# Patient Record
Sex: Female | Born: 1995 | Race: Black or African American | Hispanic: No | Marital: Single | Smoking: Never smoker
Health system: Southern US, Community
[De-identification: ages and names within clinical notes are randomized; demographics above are authoritative.]

## PROBLEM LIST (undated history)

## (undated) ENCOUNTER — Inpatient Hospital Stay (HOSPITAL_COMMUNITY): Payer: Self-pay

## (undated) DIAGNOSIS — B3731 Acute candidiasis of vulva and vagina: Secondary | ICD-10-CM

## (undated) DIAGNOSIS — R87629 Unspecified abnormal cytological findings in specimens from vagina: Secondary | ICD-10-CM

## (undated) DIAGNOSIS — F419 Anxiety disorder, unspecified: Secondary | ICD-10-CM

## (undated) DIAGNOSIS — Z8751 Personal history of pre-term labor: Secondary | ICD-10-CM

## (undated) DIAGNOSIS — O99345 Other mental disorders complicating the puerperium: Secondary | ICD-10-CM

## (undated) DIAGNOSIS — B373 Candidiasis of vulva and vagina: Secondary | ICD-10-CM

## (undated) DIAGNOSIS — E0590 Thyrotoxicosis, unspecified without thyrotoxic crisis or storm: Secondary | ICD-10-CM

## (undated) DIAGNOSIS — F53 Postpartum depression: Secondary | ICD-10-CM

## (undated) HISTORY — DX: Anxiety disorder, unspecified: F41.9

## (undated) HISTORY — DX: Unspecified abnormal cytological findings in specimens from vagina: R87.629

## (undated) HISTORY — DX: Thyrotoxicosis, unspecified without thyrotoxic crisis or storm: E05.90

## (undated) HISTORY — DX: Personal history of pre-term labor: Z87.51

## (undated) HISTORY — DX: Postpartum depression: F53.0

## (undated) HISTORY — PX: NO PAST SURGERIES: SHX2092

## (undated) HISTORY — DX: Other mental disorders complicating the puerperium: O99.345

---
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
## 2015-02-13 ENCOUNTER — Telehealth: Payer: Self-pay | Admitting: *Deleted

## 2015-02-13 NOTE — Telephone Encounter (Signed)
Patient contacted the office requesting an appointment for a Nexplanon removal.  Attempted to contact patient left message with patient's mother to contact the patient and asked her have patient to return the call.

## 2015-02-16 ENCOUNTER — Encounter (HOSPITAL_COMMUNITY): Payer: Self-pay | Admitting: Emergency Medicine

## 2015-02-16 ENCOUNTER — Other Ambulatory Visit (HOSPITAL_COMMUNITY)
Admission: RE | Admit: 2015-02-16 | Discharge: 2015-02-16 | Disposition: A | Discharge status: Home / Self Care | Payer: Medicaid Other | Source: Ambulatory Visit | Attending: Family Medicine | Admitting: Family Medicine

## 2015-02-16 ENCOUNTER — Emergency Department (INDEPENDENT_AMBULATORY_CARE_PROVIDER_SITE_OTHER)
Admission: EM | Admit: 2015-02-16 | Discharge: 2015-02-16 | Disposition: A | Discharge status: Home / Self Care | Payer: Self-pay | Source: Home / Self Care

## 2015-02-16 DIAGNOSIS — Z113 Encounter for screening for infections with a predominantly sexual mode of transmission: Secondary | ICD-10-CM | POA: Insufficient documentation

## 2015-02-16 DIAGNOSIS — N760 Acute vaginitis: Secondary | ICD-10-CM | POA: Insufficient documentation

## 2015-02-16 DIAGNOSIS — R112 Nausea with vomiting, unspecified: Secondary | ICD-10-CM

## 2015-02-16 DIAGNOSIS — N898 Other specified noninflammatory disorders of vagina: Secondary | ICD-10-CM

## 2015-02-16 LAB — POCT URINALYSIS DIP (DEVICE)
Bilirubin Urine: NEGATIVE
GLUCOSE, UA: NEGATIVE mg/dL
Hgb urine dipstick: NEGATIVE
Ketones, ur: NEGATIVE mg/dL
Leukocytes, UA: NEGATIVE
Nitrite: NEGATIVE
PROTEIN: 30 mg/dL — AB
Specific Gravity, Urine: 1.02 (ref 1.005–1.030)
Urobilinogen, UA: 1 mg/dL (ref 0.0–1.0)
pH: 7 (ref 5.0–8.0)

## 2015-02-16 LAB — POCT PREGNANCY, URINE: PREG TEST UR: NEGATIVE

## 2015-02-16 MED ORDER — METOCLOPRAMIDE HCL 5 MG PO TABS: 5.0000 mg | ORAL_TABLET | Freq: Three times a day (TID) | ORAL | Status: DC | PRN

## 2015-02-16 NOTE — ED Notes (Signed)
Call back number verified.  

## 2015-02-16 NOTE — ED Notes (Signed)
C/o nauseas and vomiting x6 since 1100 today Denies fevers, chills Also reports vag d/c Alert... No acute distress.

## 2015-02-16 NOTE — ED Provider Notes (Signed)
CSN: [id number]     Arrival date & time 02/16/15  4098 History   None    Chief Complaint  Patient presents with  . Nausea  . Vaginal Discharge   (Consider location/radiation/quality/duration/timing/severity/associated sxs/prior Treatment) Patient is a 19 y.o. female presenting with vomiting and vaginal discharge. The history is provided by the patient. No language interpreter was used.  Emesis Severity:  Moderate Duration:  10 hours Timing:  Intermittent Quality:  Stomach contents Able to tolerate:  Liquids and solids Progression:  Unchanged Recent urination:  Normal Relieved by:  Antiemetics (OTC medication for N/V) Exacerbated by: Smell at work. Associated symptoms: no abdominal pain, no cough, no diarrhea, no fever, no headaches and no URI   Risk factors: sick contacts   Risk factors: not pregnant now and no suspect food intake   Risk factors comment:  Mom was recently sick with nausea and diarrhea Vaginal Discharge Quality:  Watery and clear Onset quality:  Gradual Timing:  Intermittent Context: spontaneously   Relieved by:  Nothing Worsened by:  Nothing tried Associated symptoms: vomiting   Associated symptoms: no abdominal pain, no dysuria, no fever and no vaginal itching   Associated symptoms comment:  Sexually active She is worried about STD and will like to get screened  Past Medical History  Diagnosis Date  . Asthma   . Yeast infection of the vagina   . Post partum depression    Past Surgical History  Procedure Laterality Date  . No past surgeries     Family History  Problem Relation Age of Onset  . Hypertension Mother   . Diabetes Mother   . Asthma Father    Social History  Substance Use Topics  . Smoking status: Never Smoker   . Smokeless tobacco: Never Used  . Alcohol Use: No   OB History    Gravida Para Term Preterm AB TAB SAB Ectopic Multiple Living   0 0 0 0 0 2     Review of Systems  Constitutional: Negative for fever.    Respiratory: Negative.   Cardiovascular: Negative.   Gastrointestinal: Positive for vomiting. Negative for abdominal pain and diarrhea.  Genitourinary: Positive for vaginal discharge. Negative for dysuria.  Neurological: Negative for headaches.  All other systems reviewed and are negative.   Allergies  Review of patient's allergies indicates no known allergies.  Home Medications   Prior to Admission medications   Medication Sig Start Date End Date Taking? Authorizing Provider  ibuprofen (ADVIL,MOTRIN) 800 MG tablet Take 1 tablet (800 mg total) by mouth every 8 (eight) hours as needed. 07/18/14   Brock Bad, MD  norethindrone-ethinyl estradiol 1/35 (ORTHO-NOVUM 1/35, 28,) tablet Take 1 tablet by mouth daily. 07/18/14   Brock Bad, MD  Prenat-FeCbn-FeAspGl-FA-Omega (OB COMPLETE PETITE) 35-5-1-200 MG CAPS Take 1 capsule by mouth daily before breakfast. 07/21/14   Brock Bad, MD  PROAIR HFA 108 [phone] BASE) MCG/ACT inhaler INHALE ONE TO TWO PUFFS INTO LUNGS 4 TIMES DAILY AS NEEDED 11/07/12   Antionette Char, MD  sertraline (ZOLOFT) 50 MG tablet Take 1 tablet (50 mg total) by mouth daily. 07/21/14   Brock Bad, MD  SPRINTEC 28 0.25-35 MG-MCG tablet TAKE ONE TABLET BY MOUTH ONCE DAILY Patient not taking: Reported on 07/18/2014 06/22/14   Brock Bad, MD   Meds Ordered and Administered this Visit  Medications - No data to display  BP 111/66 mmHg  Pulse 82  Temp(Src) 98.4 F (36.9 C) (Oral)  Resp 18  SpO2 100% No data found.   Physical Exam  Constitutional: She appears well-developed and well-nourished. No distress.  Cardiovascular: Normal rate, regular rhythm and normal heart sounds.   No murmur heard. Pulmonary/Chest: Effort normal and breath sounds normal. No respiratory distress. She has no wheezes.  Abdominal: Soft. Bowel sounds are normal. She exhibits no distension and no mass. There is no tenderness.  Genitourinary: Vagina normal and uterus normal.  Cervix exhibits no motion tenderness and no friability. No vaginal discharge found.    Nursing note and vitals reviewed.   ED Course  Procedures (including critical care time)  Labs Review Labs Reviewed  POCT URINALYSIS DIP (DEVICE) - Abnormal; Notable for the following:    Protein, ur 30 (*)    All other components within normal limits  POCT PREGNANCY, URINE    Imaging Review No results found.   Visual Acuity Review  Right Eye Distance:   Left Eye Distance:   Bilateral Distance:    Right Eye Near:   Left Eye Near:    Bilateral Near:         MDM  No diagnosis found. Non-intractable vomiting with nausea, vomiting of unspecified type  Vaginal discharge  1. Patient had not vomited in the last 4 hrs. She is well hydrated with hemodynamic stability. Reglan given prn N/V. Keep self hydrate. Return precaution discussed. 2. Wet prep done, I will call her with result. GC/Chlamydia also checked with HIV test. I will call patient with results. No treatment indicated at this time.    Doreene Eland, MD 02/16/15 2007

## 2015-02-16 NOTE — Discharge Instructions (Signed)

## 2015-02-17 LAB — HIV ANTIBODY (ROUTINE TESTING W REFLEX): HIV Screen 4th Generation wRfx: NONREACTIVE

## 2015-02-18 LAB — CERVICOVAGINAL ANCILLARY ONLY
Chlamydia: NEGATIVE
NEISSERIA GONORRHEA: NEGATIVE

## 2015-02-18 NOTE — ED Notes (Signed)
Gc, chlamydia reports negative. Still waiting on wet prep and HIV reports

## 2015-02-19 ENCOUNTER — Telehealth: Payer: Self-pay | Admitting: Family Medicine

## 2015-02-19 ENCOUNTER — Inpatient Hospital Stay (HOSPITAL_COMMUNITY)
Admission: AD | Admit: 2015-02-19 | Discharge: 2015-02-19 | Disposition: A | Discharge status: Home / Self Care | Payer: Medicaid Other | Source: Ambulatory Visit | Attending: Obstetrics | Admitting: Obstetrics

## 2015-02-19 DIAGNOSIS — Z3202 Encounter for pregnancy test, result negative: Secondary | ICD-10-CM | POA: Diagnosis not present

## 2015-02-19 DIAGNOSIS — R112 Nausea with vomiting, unspecified: Secondary | ICD-10-CM | POA: Diagnosis not present

## 2015-02-19 LAB — URINALYSIS, ROUTINE W REFLEX MICROSCOPIC
Bilirubin Urine: NEGATIVE
Glucose, UA: NEGATIVE mg/dL
Hgb urine dipstick: NEGATIVE
Ketones, ur: 15 mg/dL — AB
Leukocytes, UA: NEGATIVE
Nitrite: NEGATIVE
PH: 6 (ref 5.0–8.0)
Protein, ur: NEGATIVE mg/dL
SPECIFIC GRAVITY, URINE: 1.02 (ref 1.005–1.030)
UROBILINOGEN UA: 0.2 mg/dL (ref 0.0–1.0)

## 2015-02-19 LAB — CERVICOVAGINAL ANCILLARY ONLY: Wet Prep (BD Affirm): POSITIVE — AB

## 2015-02-19 LAB — POCT PREGNANCY, URINE: PREG TEST UR: NEGATIVE

## 2015-02-19 MED ORDER — ONDANSETRON HCL 4 MG PO TABS: 4.0000 mg | ORAL_TABLET | Freq: Four times a day (QID) | ORAL | Status: DC

## 2015-02-19 MED ORDER — METRONIDAZOLE 500 MG PO TABS: 500.0000 mg | ORAL_TABLET | Freq: Two times a day (BID) | ORAL | Status: DC

## 2015-02-19 NOTE — Discharge Instructions (Signed)
Nausea and Vomiting Nausea means you feel sick to your stomach. Throwing up (vomiting) is a reflex where stomach contents come out of your mouth. HOME CARE   Take medicine as told by your doctor.  Do not force yourself to eat. However, you do need to drink fluids.  If you feel like eating, eat a normal diet as told by your doctor.  Eat rice, wheat, potatoes, bread, lean meats, yogurt, fruits, and vegetables.  Avoid high-fat foods.  Drink enough fluids to keep your pee (urine) clear or pale yellow.  Ask your doctor how to replace body fluid losses (rehydrate). Signs of body fluid loss (dehydration) include:  Feeling very thirsty.  Dry lips and mouth.  Feeling dizzy.  Dark pee.  Peeing less than normal.  Feeling confused.  Fast breathing or heart rate. GET HELP RIGHT AWAY IF:   You have blood in your throw up.  You have black or bloody poop (stool).  You have a bad headache or stiff neck.  You feel confused.  You have bad belly (abdominal) pain.  You have chest pain or trouble breathing.  You do not pee at least once every 8 hours.  You have cold, clammy skin.  You keep throwing up after 24 to 48 hours.  You have a fever. MAKE SURE YOU:   Understand these instructions.  Will watch your condition.  Will get help right away if you are not doing well or get worse. Document Released: 11/25/2007 Document Revised: 08/31/2011 Document Reviewed: 11/07/2010 [facility] Patient Information 2015 [place], [place]. This information is not intended to replace advice given to you by your health care provider. Make sure you discuss any questions you have with your health care provider. Bacterial Vaginosis Bacterial vaginosis is an infection of the vagina. It happens when too many of certain germs (bacteria) grow in the vagina. HOME CARE  Take your medicine as told by your doctor.  Finish your medicine even if you start to feel better.  Do not have sex until you finish  your medicine and are better.  Tell your sex partner that you have an infection. They should see their doctor for treatment.  Practice safe sex. Use condoms. Have only one sex partner. GET HELP IF:  You are not getting better after 3 days of treatment.  You have more grey fluid (discharge) coming from your vagina than before.  You have more pain than before.  You have a fever. MAKE SURE YOU:   Understand these instructions.  Will watch your condition.  Will get help right away if you are not doing well or get worse. Document Released: 03/17/2008 Document Revised: 03/29/2013 Document Reviewed: 01/18/2013 [facility] Patient Information 2015 [place], [place]. This information is not intended to replace advice given to you by your health care provider. Make sure you discuss any questions you have with your health care provider.

## 2015-02-19 NOTE — MAU Note (Signed)
Pt states she was seen at urgent care 2 days ago due to vomiting and states everything was negative. States she started having vomiting again today, last vomited 2 hours ago but is still nauseated.

## 2015-02-19 NOTE — MAU Provider Note (Signed)
History     CSN: [id number]  Arrival date and time: 02/19/15 2225   First Provider Initiated Contact with Patient 02/19/15 2321      No chief complaint on file.  HPI  [name] is a 19 y.o. [id number] who presents to MAU today with complaint of N/V. She states that she was seen at Urgent Care 2 days ago with the same symptoms and diagnosed with BV. She was given Rx for Flagyl which she started last night. She states that N/V started again earlier today, but she has not had vomiting in > 2 hours. She denies vaginal bleeding or discharge. She has not taken anything for nausea.   OB History    Gravida Para Term Preterm AB TAB SAB Ectopic Multiple Living   0 0 0 0 0 2      Past Medical History  Diagnosis Date  . Asthma   . Yeast infection of the vagina   . Post partum depression     Past Surgical History  Procedure Laterality Date  . No past surgeries      Family History  Problem Relation Age of Onset  . Hypertension Mother   . Diabetes Mother   . Asthma Father     Social History  Substance Use Topics  . Smoking status: Never Smoker   . Smokeless tobacco: Never Used  . Alcohol Use: No    Allergies: No Known Allergies  No prescriptions prior to admission    Review of Systems  Constitutional: Negative for fever and malaise/fatigue.  Gastrointestinal: Positive for nausea and vomiting. Negative for abdominal pain, diarrhea and constipation.  Genitourinary: Negative for dysuria, urgency and frequency.       Neg - vaginal bleeding, discharge   Physical Exam   Blood pressure 112/63, pulse 70, temperature 98.7 F (37.1 C), temperature source Oral, resp. rate 17, height 5' 2.5" (1.588 m), weight 110 lb (49.896 kg), last menstrual period 02/01/2015, SpO2 100 %.  Physical Exam  Nursing note and vitals reviewed. Constitutional: She is oriented to person, place, and time. She appears well-developed and well-nourished. No distress.  HENT:  Head:  Normocephalic and atraumatic.  Cardiovascular: Normal rate.   Respiratory: Effort normal.  GI: Soft. She exhibits no distension and no mass. There is no tenderness. There is no rebound and no guarding.  Neurological: She is alert and oriented to person, place, and time.  Skin: Skin is warm and dry. No erythema.  Psychiatric: She has a normal mood and affect.     Results for orders placed or performed during the hospital encounter of 02/19/15 (from the past 24 hour(s))  Urinalysis, Routine w reflex microscopic (not at [facility])     Status: Abnormal   Collection Time: 02/19/15 10:45 PM  Result Value Ref Range   Color, Urine YELLOW YELLOW   APPearance CLEAR CLEAR   Specific Gravity, Urine 1.020 1.005 - 1.030   pH 6.0 5.0 - 8.0   Glucose, UA NEGATIVE NEGATIVE mg/dL   Hgb urine dipstick NEGATIVE NEGATIVE   Bilirubin Urine NEGATIVE NEGATIVE   Ketones, ur 15 (A) NEGATIVE mg/dL   Protein, ur NEGATIVE NEGATIVE mg/dL   Urobilinogen, UA 0.2 0.0 - 1.0 mg/dL   Nitrite NEGATIVE NEGATIVE   Leukocytes, UA NEGATIVE NEGATIVE  Pregnancy, urine POC     Status: None   Collection Time: 02/19/15 10:59 PM  Result Value Ref Range   Preg Test, Ur NEGATIVE NEGATIVE    MAU Course  Procedures None  MDM UPT - negative UA today does not show significant dehydration. Patient is tolerating PO while in MAU.   Assessment and Plan  A: Nausea and vomiting with Flagyl  P: Discharge home Rx for Zofran given to patient Patient advised to complete course of Flagyl Warning signs for worsening condition discussed Patient advised to follow-up with Dr. Clearance Coots as needed for routine care Patient may return to MAU as needed or if her condition were to change or worsen   Marny Lowenstein, PA-C  02/19/2015, 11:50 PM

## 2015-02-19 NOTE — ED Notes (Signed)
Lab reports discussed w patient by provider

## 2015-02-19 NOTE — Telephone Encounter (Signed)
I discussed + BV with patient. She is advised to pick up A/B prescription from her pharmacy.

## 2015-03-29 ENCOUNTER — Ambulatory Visit: Payer: Medicaid Other | Admitting: Certified Nurse Midwife

## 2015-05-12 ENCOUNTER — Encounter (HOSPITAL_COMMUNITY): Payer: Self-pay | Admitting: *Deleted

## 2015-05-12 ENCOUNTER — Inpatient Hospital Stay (HOSPITAL_COMMUNITY)
Admission: AD | Admit: 2015-05-12 | Discharge: 2015-05-12 | Disposition: A | Discharge status: Home / Self Care | Payer: Self-pay | Source: Ambulatory Visit | Attending: Obstetrics | Admitting: Obstetrics

## 2015-05-12 DIAGNOSIS — N926 Irregular menstruation, unspecified: Secondary | ICD-10-CM | POA: Insufficient documentation

## 2015-05-12 DIAGNOSIS — N939 Abnormal uterine and vaginal bleeding, unspecified: Secondary | ICD-10-CM | POA: Insufficient documentation

## 2015-05-12 LAB — URINALYSIS, ROUTINE W REFLEX MICROSCOPIC
Bilirubin Urine: NEGATIVE
Glucose, UA: NEGATIVE mg/dL
KETONES UR: NEGATIVE mg/dL
Leukocytes, UA: NEGATIVE
NITRITE: NEGATIVE
PROTEIN: NEGATIVE mg/dL
Specific Gravity, Urine: 1.02 (ref 1.005–1.030)
pH: 7.5 (ref 5.0–8.0)

## 2015-05-12 LAB — CBC
HEMATOCRIT: 35 % — AB (ref 36.0–46.0)
HEMOGLOBIN: 12 g/dL (ref 12.0–15.0)
MCH: 27.1 pg (ref 26.0–34.0)
MCHC: 34.3 g/dL (ref 30.0–36.0)
MCV: 79.2 fL (ref 78.0–100.0)
Platelets: 248 10*3/uL (ref 150–400)
RBC: 4.42 MIL/uL (ref 3.87–5.11)
RDW: 12.8 % (ref 11.5–15.5)
WBC: 6.5 10*3/uL (ref 4.0–10.5)

## 2015-05-12 LAB — WET PREP, GENITAL
CLUE CELLS WET PREP: NONE SEEN
SPERM: NONE SEEN
Trich, Wet Prep: NONE SEEN
Yeast Wet Prep HPF POC: NONE SEEN

## 2015-05-12 LAB — URINE MICROSCOPIC-ADD ON

## 2015-05-12 LAB — HCG, QUANTITATIVE, PREGNANCY

## 2015-05-12 LAB — POCT PREGNANCY, URINE: PREG TEST UR: NEGATIVE

## 2015-05-12 NOTE — MAU Note (Signed)
Pt reports positive home preg test, some vaginal bleeding off/on for the last 3 days, lower abd pain since yesterday.

## 2015-05-12 NOTE — Discharge Instructions (Signed)
Levonorgestrel intrauterine device (IUD) What is this medicine? LEVONORGESTREL IUD (LEE voe nor jes trel) is a contraceptive (birth control) device. The device is placed inside the uterus by a healthcare professional. It is used to prevent pregnancy and can also be used to treat heavy bleeding that occurs during your period. Depending on the device, it can be used for 3 to 5 years. This medicine may be used for other purposes; ask your health care provider or pharmacist if you have questions. What should I tell my health care provider before I take this medicine? They need to know if you have any of these conditions: -abnormal Pap smear -cancer of the breast, uterus, or cervix -diabetes -endometritis -genital or pelvic infection now or in the past -have more than one sexual partner or your partner has more than one partner -heart disease -history of an ectopic or tubal pregnancy -immune system problems -IUD in place -liver disease or tumor -problems with blood clots or take blood-thinners -use intravenous drugs -uterus of unusual shape -vaginal bleeding that has not been explained -an unusual or allergic reaction to levonorgestrel, other hormones, silicone, or polyethylene, medicines, foods, dyes, or preservatives -pregnant or trying to get pregnant -breast-feeding How should I use this medicine? This device is placed inside the uterus by a health care professional. Talk to your pediatrician regarding the use of this medicine in children. Special care may be needed. Overdosage: If you think you have taken too much of this medicine contact a poison control center or emergency room at once. NOTE: This medicine is only for you. Do not share this medicine with others. What if I miss a dose? This does not apply. What may interact with this medicine? Do not take this medicine with any of the following medications: -amprenavir -bosentan -fosamprenavir This medicine may also interact with  the following medications: -aprepitant -barbiturate medicines for inducing sleep or treating seizures -bexarotene -griseofulvin -medicines to treat seizures like carbamazepine, ethotoin, felbamate, oxcarbazepine, phenytoin, topiramate -modafinil -pioglitazone -rifabutin -rifampin -rifapentine -some medicines to treat HIV infection like atazanavir, indinavir, lopinavir, nelfinavir, tipranavir, ritonavir -St. John's wort -warfarin This list may not describe all possible interactions. Give your health care provider a list of all the medicines, herbs, non-prescription drugs, or dietary supplements you use. Also tell them if you smoke, drink alcohol, or use illegal drugs. Some items may interact with your medicine. What should I watch for while using this medicine? Visit your doctor or health care professional for regular check ups. See your doctor if you or your partner has sexual contact with others, becomes HIV positive, or gets a sexual transmitted disease. This product does not protect you against HIV infection (AIDS) or other sexually transmitted diseases. You can check the placement of the IUD yourself by reaching up to the top of your vagina with clean fingers to feel the threads. Do not pull on the threads. It is a good habit to check placement after each menstrual period. Call your doctor right away if you feel more of the IUD than just the threads or if you cannot feel the threads at all. The IUD may come out by itself. You may become pregnant if the device comes out. If you notice that the IUD has come out use a backup birth control method like condoms and call your health care provider. Using tampons will not change the position of the IUD and are okay to use during your period. What side effects may I notice from receiving this medicine?   Side effects that you should report to your doctor or health care professional as soon as possible: -allergic reactions like skin rash, itching or  hives, swelling of the face, lips, or tongue -fever, flu-like symptoms -genital sores -high blood pressure -no menstrual period for 6 weeks during use -pain, swelling, warmth in the leg -pelvic pain or tenderness -severe or sudden headache -signs of pregnancy -stomach cramping -sudden shortness of breath -trouble with balance, talking, or walking -unusual vaginal bleeding, discharge -yellowing of the eyes or skin Side effects that usually do not require medical attention (report to your doctor or health care professional if they continue or are bothersome): -acne -breast pain -change in sex drive or performance -changes in weight -cramping, dizziness, or faintness while the device is being inserted -headache -irregular menstrual bleeding within first 3 to 6 months of use -nausea This list may not describe all possible side effects. Call your doctor for medical advice about side effects. You may report side effects to FDA at 1-800-FDA-1088. Where should I keep my medicine? This does not apply. NOTE: This sheet is a summary. It may not cover all possible information. If you have questions about this medicine, talk to your doctor, pharmacist, or health care provider.    2016, Elsevier/Gold Standard. (2011-07-09 13:54:04)  

## 2015-05-12 NOTE — MAU Provider Note (Signed)
History     CSN: [id number]  Arrival date and time: 05/12/15 2041   First Provider Initiated Contact with Patient 05/12/15 2158      No chief complaint on file.  Vaginal Bleeding The patient's primary symptoms include vaginal bleeding. This is a new problem. The current episode started today. The problem occurs intermittently. The problem has been unchanged. The pain is mild. The problem affects both sides. Associated symptoms include abdominal pain. Pertinent negatives include no chills, constipation, diarrhea, dysuria, fever, frequency, nausea, urgency or vomiting. The vaginal discharge was bloody. The vaginal bleeding is lighter than menses. She has not been passing clots. She has not been passing tissue. Nothing aggravates the symptoms. She has tried nothing for the symptoms. She is sexually active. She uses condoms for contraception. Her menstrual history has been irregular.    Past Medical History  Diagnosis Date  . Asthma   . Yeast infection of the vagina   . Post partum depression     Past Surgical History  Procedure Laterality Date  . No past surgeries      Family History  Problem Relation Age of Onset  . Hypertension Mother   . Diabetes Mother   . Asthma Father     Social History  Substance Use Topics  . Smoking status: Never Smoker   . Smokeless tobacco: Never Used  . Alcohol Use: No    Allergies: No Known Allergies  Facility-administered medications prior to admission  Medication Dose Route Frequency Provider Last Rate Last Dose  . etonogestrel (IMPLANON) implant 68 mg  68 mg Subcutaneous Once Brock Badharles A Harper, MD       Prescriptions prior to admission  Medication Sig Dispense Refill Last Dose  . albuterol (PROVENTIL HFA;VENTOLIN HFA) 108 (90 BASE) MCG/ACT inhaler Inhale 2 puffs into the lungs every 6 (six) hours as needed for wheezing or shortness of breath.   PRN at PRN  . ibuprofen (ADVIL,MOTRIN) 800 MG tablet Take 1 tablet (800 mg total) by mouth  every 8 (eight) hours as needed. (Patient taking differently: Take 800 mg by mouth every 8 (eight) hours as needed for mild pain. ) 30 tablet 5 05/11/2015 at 2030  . metoCLOPramide (REGLAN) 5 MG tablet Take 1 tablet (5 mg total) by mouth every 8 (eight) hours as needed for nausea. (Patient not taking: Reported on 05/12/2015) 10 tablet 0   . ondansetron (ZOFRAN) 4 MG tablet Take 1 tablet (4 mg total) by mouth every 6 (six) hours. (Patient not taking: Reported on 05/12/2015) 12 tablet 0     Review of Systems  Constitutional: Negative for fever and chills.  Gastrointestinal: Positive for abdominal pain. Negative for nausea, vomiting, diarrhea and constipation.  Genitourinary: Positive for vaginal bleeding. Negative for dysuria, urgency and frequency.   Physical Exam   Blood pressure 118/69, pulse 74, temperature 97.4 F (36.3 C), temperature source Oral, resp. rate 18, height 5' 2.5" (1.588 m), weight 51.71 kg (114 lb), last menstrual period 03/25/2015, SpO2 100 %.  Physical Exam  Nursing note and vitals reviewed. Constitutional: She is oriented to person, place, and time. She appears well-developed and well-nourished. No distress.  HENT:  Head: Normocephalic.  Cardiovascular: Normal rate.   Respiratory: Effort normal.  GI: Soft. There is no tenderness. There is no rebound.  Neurological: She is alert and oriented to person, place, and time.  Skin: Skin is warm and dry.  Psychiatric: She has a normal mood and affect.   Results for orders placed or performed during  the hospital encounter of 05/12/15 (from the past 24 hour(s))  Urinalysis, Routine w reflex microscopic (not at [facility])     Status: Abnormal   Collection Time: 05/12/15  8:52 PM  Result Value Ref Range   Color, Urine YELLOW YELLOW   APPearance CLEAR CLEAR   Specific Gravity, Urine 1.020 1.005 - 1.030   pH 7.5 5.0 - 8.0   Glucose, UA NEGATIVE NEGATIVE mg/dL   Hgb urine dipstick LARGE (A) NEGATIVE   Bilirubin Urine NEGATIVE  NEGATIVE   Ketones, ur NEGATIVE NEGATIVE mg/dL   Protein, ur NEGATIVE NEGATIVE mg/dL   Nitrite NEGATIVE NEGATIVE   Leukocytes, UA NEGATIVE NEGATIVE  Urine microscopic-add on     Status: Abnormal   Collection Time: 05/12/15  8:52 PM  Result Value Ref Range   Squamous Epithelial / LPF 0-5 (A) NONE SEEN   WBC, UA 0-5 0 - 5 WBC/hpf   RBC / HPF TOO NUMEROUS TO COUNT 0 - 5 RBC/hpf   Bacteria, UA RARE (A) NONE SEEN   Urine-Other URINALYSIS PERFORMED ON SUPERNATANT   Pregnancy, urine POC     Status: None   Collection Time: 05/12/15  9:23 PM  Result Value Ref Range   Preg Test, Ur NEGATIVE NEGATIVE  CBC     Status: Abnormal   Collection Time: 05/12/15 10:00 PM  Result Value Ref Range   WBC 6.5 4.0 - 10.5 K/uL   RBC 4.42 3.87 - 5.11 MIL/uL   Hemoglobin 12.0 12.0 - 15.0 g/dL   HCT 09.8 (L) 11.9 - 14.7 %   MCV 79.2 78.0 - 100.0 fL   MCH 27.1 26.0 - 34.0 pg   MCHC 34.3 30.0 - 36.0 g/dL   RDW 82.9 56.2 - 13.0 %   Platelets 248 150 - 400 K/uL  hCG, quantitative, pregnancy     Status: None   Collection Time: 05/12/15 10:00 PM  Result Value Ref Range   hCG, Beta Chain, Quant, S <1 <5 mIU/mL  Wet prep, genital     Status: Abnormal   Collection Time: 05/12/15 10:29 PM  Result Value Ref Range   Yeast Wet Prep HPF POC NONE SEEN NONE SEEN   Trich, Wet Prep NONE SEEN NONE SEEN   Clue Cells Wet Prep HPF POC NONE SEEN NONE SEEN   WBC, Wet Prep HPF POC FEW (A) NONE SEEN   Sperm NONE SEEN     MAU Course  Procedures  MDM   Assessment and Plan   1. Irregular menstrual cycle    DC home RX: none  Return to MAU as needed FU w/ Dr. Clearance Coots as planned for IUD insertion   Follow-up Information    Follow up with HARPER,CHARLES A, MD.   Specialty:  Obstetrics and Gynecology   Why:  As scheduled   Contact information:   [address] [place] [address] [phone]        Tawnya Crook 05/12/2015, 10:23 PM

## 2015-05-13 LAB — RPR: RPR: NONREACTIVE

## 2015-05-13 LAB — HIV ANTIBODY (ROUTINE TESTING W REFLEX): HIV SCREEN 4TH GENERATION: NONREACTIVE

## 2015-05-15 LAB — GC/CHLAMYDIA PROBE AMP (~~LOC~~) NOT AT ARMC
Chlamydia: NEGATIVE
Neisseria Gonorrhea: NEGATIVE

## 2015-05-26 ENCOUNTER — Inpatient Hospital Stay (HOSPITAL_COMMUNITY)
Admission: AD | Admit: 2015-05-26 | Discharge: 2015-05-26 | Disposition: A | Discharge status: Home / Self Care | Payer: Self-pay | Source: Ambulatory Visit | Attending: Obstetrics | Admitting: Obstetrics

## 2015-05-26 ENCOUNTER — Encounter (HOSPITAL_COMMUNITY): Payer: Self-pay

## 2015-05-26 DIAGNOSIS — Z3202 Encounter for pregnancy test, result negative: Secondary | ICD-10-CM | POA: Insufficient documentation

## 2015-05-26 DIAGNOSIS — R112 Nausea with vomiting, unspecified: Secondary | ICD-10-CM

## 2015-05-26 DIAGNOSIS — R197 Diarrhea, unspecified: Secondary | ICD-10-CM | POA: Insufficient documentation

## 2015-05-26 DIAGNOSIS — R1110 Vomiting, unspecified: Secondary | ICD-10-CM | POA: Insufficient documentation

## 2015-05-26 LAB — URINE MICROSCOPIC-ADD ON

## 2015-05-26 LAB — URINALYSIS, ROUTINE W REFLEX MICROSCOPIC
GLUCOSE, UA: NEGATIVE mg/dL
Hgb urine dipstick: NEGATIVE
KETONES UR: NEGATIVE mg/dL
Nitrite: NEGATIVE
PH: 5.5 (ref 5.0–8.0)
PROTEIN: 30 mg/dL — AB
Specific Gravity, Urine: >1.03 — ABNORMAL HIGH (ref 1.005–1.030)

## 2015-05-26 LAB — POCT PREGNANCY, URINE: Preg Test, Ur: NEGATIVE

## 2015-05-26 MED ORDER — LACTATED RINGERS IV SOLN
INTRAVENOUS | Status: DC
  Administered 2015-05-26: 12:00:00 via INTRAVENOUS

## 2015-05-26 MED ORDER — PROMETHAZINE HCL 25 MG/ML IJ SOLN
12.5000 mg | Freq: Once | INTRAMUSCULAR | Status: AC
  Administered 2015-05-26: 12.5 mg via INTRAVENOUS
  Filled 2015-05-26: qty 1

## 2015-05-26 NOTE — MAU Note (Addendum)
last 2 or 3 days has not been able to keep anything down. No one else at home is sick.  Also having diarrhea.  No fever

## 2015-05-26 NOTE — Discharge Instructions (Signed)
Discussed at length BRAT diet, advised liquids and crackers today.  Advised no fat or fried foods.   Advised if diarrhea continues, can try immodium by th package directions. Advised to see family MD for periodic numbness in legs. To see Dr. Clearance CootsHarper for management of Nexplanon which expires in February.

## 2015-05-26 NOTE — MAU Provider Note (Signed)
History     CSN: [id number]  Arrival date and time: 05/26/15 1006   None     Chief Complaint  Patient presents with  . Emesis  . Diarrhea   HPI Judy [name] 19 y.o. Comes to MAU with vomiting and diarrhea since yesterday.  Diarrhea is green.  Has vomited 3-4 times.  Also having periodic numbness in her legs when she is walking.  Wants pain medication for numbness in legs.  OB History    Gravida Para Term Preterm AB TAB SAB Ectopic Multiple Living   0 0 0 0 0 2      Past Medical History  Diagnosis Date  . Asthma   . Yeast infection of the vagina   . Post partum depression     Past Surgical History  Procedure Laterality Date  . No past surgeries      Family History  Problem Relation Age of Onset  . Hypertension Mother   . Diabetes Mother   . Asthma Father     Social History  Substance Use Topics  . Smoking status: Never Smoker   . Smokeless tobacco: Never Used  . Alcohol Use: No    Allergies: No Known Allergies  Facility-administered medications prior to admission  Medication Dose Route Frequency Provider Last Rate Last Dose  . etonogestrel (IMPLANON) implant 68 mg  68 mg Subcutaneous Once Brock Bad, MD       Prescriptions prior to admission  Medication Sig Dispense Refill Last Dose  . albuterol (PROVENTIL HFA;VENTOLIN HFA) 108 (90 BASE) MCG/ACT inhaler Inhale 2 puffs into the lungs every 6 (six) hours as needed for wheezing or shortness of breath.   rescue  . ibuprofen (ADVIL,MOTRIN) 800 MG tablet Take 1 tablet (800 mg total) by mouth every 8 (eight) hours as needed. (Patient not taking: Reported on 05/26/2015) 30 tablet 5 05/11/2015 at 2030    Review of Systems  Constitutional: Negative for fever.  Gastrointestinal: Positive for nausea, vomiting and diarrhea. Negative for abdominal pain and constipation.  Genitourinary:       No vaginal discharge. No vaginal bleeding. No dysuria.   Physical Exam   Blood pressure 107/76,  pulse 116, temperature 99.8 F (37.7 C), temperature source Oral, resp. rate 16, last menstrual period 05/14/2015.  Physical Exam  Nursing note and vitals reviewed. Constitutional: She is oriented to person, place, and time. She appears well-developed and well-nourished. No distress.  HENT:  Head: Normocephalic.  Eyes: EOM are normal.  Neck: Neck supple.  Respiratory: Effort normal.  Musculoskeletal: Normal range of motion.  Neurological: She is alert and oriented to person, place, and time.  Skin: Skin is warm and dry.  Psychiatric: She has a normal mood and affect.    MAU Course  Procedures  MDM Results for orders placed or performed during the hospital encounter of 05/26/15 (from the past 24 hour(s))  Urinalysis, Routine w reflex microscopic (not at [facility])     Status: Abnormal   Collection Time: 05/26/15 10:30 AM  Result Value Ref Range   Color, Urine YELLOW YELLOW   APPearance CLEAR CLEAR   Specific Gravity, Urine >1.030 (H) 1.005 - 1.030   pH 5.5 5.0 - 8.0   Glucose, UA NEGATIVE NEGATIVE mg/dL   Hgb urine dipstick NEGATIVE NEGATIVE   Bilirubin Urine SMALL (A) NEGATIVE   Ketones, ur NEGATIVE NEGATIVE mg/dL   Protein, ur 30 (A) NEGATIVE mg/dL   Nitrite NEGATIVE NEGATIVE   Leukocytes, UA TRACE (A)  NEGATIVE  Urine microscopic-add on     Status: Abnormal   Collection Time: 05/26/15 10:30 AM  Result Value Ref Range   Squamous Epithelial / LPF 0-5 (A) NONE SEEN   WBC, UA 6-30 0 - 5 WBC/hpf   RBC / HPF 0-5 0 - 5 RBC/hpf   Bacteria, UA FEW (A) NONE SEEN   Urine-Other AMORPHOUS URATES/PHOSPHATES   Pregnancy, urine POC     Status: None   Collection Time: 05/26/15 10:41 AM  Result Value Ref Range   Preg Test, Ur NEGATIVE NEGATIVE   Urine culture ordered IVF given with Phenergan 12.5 mg IVP.  Able to eat crackers and ginger ale after fluids infused.  Assessment and Plan  Vomiting and diarrhea  Plan Discussed at length BRAT diet, advised liquids and crackers today.   Advised no fat or fried foods.   Advised if diarrhea continues, can try immodium by th package directions. Advised to see family MD for periodic numbness in legs. To see Dr. Clearance CootsHarper for management of Nexplanon which expires in February.  Lamondre Wesche 05/26/2015, 2:05 PM

## 2015-06-23 NOTE — L&D Delivery Note (Signed)
20 y/o G3 now P3 at 35 and 6 wks present with spontaneous on set of labor. Called to room pt was complete. AROM for clear fluid.  Delivery Note At 8:59 AM a viable female was delivered via Vaginal, Spontaneous Delivery (Presentation: Vertex; OA ).  APGAR: 8, 9; weight 5 lb 13 oz (2635 g).   Placenta status: delivered intact with gentle traction.  Cord: 3 vessle with the following complications: none .    Anesthesia:  epidural Episiotomy: None Lacerations: None Est. Blood Loss (mL): 100  Mom to postpartum.  Baby to Couplet care / Skin to Skin.  Judy [name] 04/02/2016, 10:33 AM

## 2015-06-27 ENCOUNTER — Inpatient Hospital Stay (HOSPITAL_COMMUNITY)
Admission: AD | Admit: 2015-06-27 | Discharge: 2015-06-27 | Disposition: A | Discharge status: Home / Self Care | Payer: Self-pay | Source: Ambulatory Visit | Attending: Obstetrics | Admitting: Obstetrics

## 2015-06-27 ENCOUNTER — Encounter (HOSPITAL_COMMUNITY): Payer: Self-pay

## 2015-06-27 DIAGNOSIS — J45909 Unspecified asthma, uncomplicated: Secondary | ICD-10-CM | POA: Insufficient documentation

## 2015-06-27 DIAGNOSIS — K219 Gastro-esophageal reflux disease without esophagitis: Secondary | ICD-10-CM | POA: Insufficient documentation

## 2015-06-27 LAB — URINE MICROSCOPIC-ADD ON

## 2015-06-27 LAB — URINALYSIS, ROUTINE W REFLEX MICROSCOPIC
Bilirubin Urine: NEGATIVE
Glucose, UA: NEGATIVE mg/dL
KETONES UR: 15 mg/dL — AB
LEUKOCYTES UA: NEGATIVE
NITRITE: NEGATIVE
PH: 6 (ref 5.0–8.0)
Protein, ur: NEGATIVE mg/dL
SPECIFIC GRAVITY, URINE: 1.02 (ref 1.005–1.030)

## 2015-06-27 LAB — POCT PREGNANCY, URINE: Preg Test, Ur: NEGATIVE

## 2015-06-27 MED ORDER — GI COCKTAIL ~~LOC~~
30.0000 mL | Freq: Once | ORAL | Status: AC
  Administered 2015-06-27: 30 mL via ORAL
  Filled 2015-06-27: qty 30

## 2015-06-27 NOTE — MAU Note (Signed)
Pt states for the past 4 days she has had a stabbing chest pain that comes and goes-radiates to stomach. Having some spotting when wiping. LMP 06/22/2015

## 2015-06-27 NOTE — MAU Provider Note (Signed)
  History     CSN: [id number]  Arrival date and time: 06/27/15 0106   First Provider Initiated Contact with Patient 06/27/15 0153      Chief Complaint  Patient presents with  . Possible Pregnancy  . Abdominal Pain   Abdominal Pain This is a new problem. Episode onset: 4 days ago  The onset quality is gradual. The problem has been unchanged. The pain is located in the epigastric region, periumbilical region and suprapubic region. The pain is at a severity of 6/10. The quality of the pain is sharp. The abdominal pain does not radiate. Pertinent negatives include no constipation, diarrhea, dysuria, fever, frequency, nausea or vomiting. Nothing aggravates the pain. The pain is relieved by nothing. She has tried nothing for the symptoms.    Past Medical History  Diagnosis Date  . Asthma   . Yeast infection of the vagina   . Post partum depression     Past Surgical History  Procedure Laterality Date  . No past surgeries      Family History  Problem Relation Age of Onset  . Hypertension Mother   . Diabetes Mother   . Asthma Father     Social History  Substance Use Topics  . Smoking status: Never Smoker   . Smokeless tobacco: Never Used  . Alcohol Use: No    Allergies: No Known Allergies  Facility-administered medications prior to admission  Medication Dose Route Frequency Provider Last Rate Last Dose  . etonogestrel (IMPLANON) implant 68 mg  68 mg Subcutaneous Once Brock Badharles A Harper, MD       Prescriptions prior to admission  Medication Sig Dispense Refill Last Dose  . albuterol (PROVENTIL HFA;VENTOLIN HFA) 108 (90 BASE) MCG/ACT inhaler Inhale 2 puffs into the lungs every 6 (six) hours as needed for wheezing or shortness of breath.   rescue  . ibuprofen (ADVIL,MOTRIN) 800 MG tablet Take 1 tablet (800 mg total) by mouth every 8 (eight) hours as needed. (Patient not taking: Reported on 05/26/2015) 30 tablet 5 05/11/2015 at 2030    Review of Systems  Constitutional:  Negative for fever and chills.  Gastrointestinal: Positive for abdominal pain. Negative for nausea, vomiting, diarrhea and constipation.  Genitourinary: Negative for dysuria, urgency and frequency.   Physical Exam   Blood pressure 129/54, pulse 77, temperature 98.2 F (36.8 C), temperature source Oral, resp. rate 16, height 5' 2.5" (1.588 m), weight 51.166 kg (112 lb 12.8 oz), last menstrual period 06/22/2015, SpO2 100 %.  Physical Exam  Nursing note and vitals reviewed. Constitutional: She is oriented to person, place, and time. She appears well-developed and well-nourished. No distress.  HENT:  Head: Normocephalic.  Cardiovascular: Normal rate.   Respiratory: Effort normal.  GI: Soft. There is no tenderness. There is no rebound.  Neurological: She is alert and oriented to person, place, and time.  Skin: Skin is warm and dry.  Psychiatric: She has a normal mood and affect.    MAU Course  Procedures  MDM Patient has had GI cocktail. She reports that her pain is better   Assessment and Plan   1. Gastroesophageal reflux disease, esophagitis presence not specified    DC home Comfort measures reviewed  OTC treatments discussed  RX: none    Follow-up Information    Follow up with [facility].   Why:  If symptoms worsen   Contact information:   [address] [id number]         Tawnya CrookHogan, Judy [name] 06/27/2015, 1:55 AM

## 2015-06-27 NOTE — Discharge Instructions (Signed)

## 2015-08-26 ENCOUNTER — Encounter (HOSPITAL_COMMUNITY): Payer: Self-pay | Admitting: *Deleted

## 2015-08-26 ENCOUNTER — Inpatient Hospital Stay (HOSPITAL_COMMUNITY)
Admission: AD | Admit: 2015-08-26 | Discharge: 2015-08-26 | Disposition: A | Discharge status: Home / Self Care | Payer: Medicaid Other | Source: Ambulatory Visit | Attending: Family Medicine | Admitting: Family Medicine

## 2015-08-26 ENCOUNTER — Inpatient Hospital Stay (HOSPITAL_COMMUNITY): Payer: Medicaid Other

## 2015-08-26 DIAGNOSIS — R109 Unspecified abdominal pain: Secondary | ICD-10-CM | POA: Diagnosis not present

## 2015-08-26 DIAGNOSIS — O3680X0 Pregnancy with inconclusive fetal viability, not applicable or unspecified: Secondary | ICD-10-CM

## 2015-08-26 DIAGNOSIS — J45909 Unspecified asthma, uncomplicated: Secondary | ICD-10-CM | POA: Insufficient documentation

## 2015-08-26 DIAGNOSIS — O26891 Other specified pregnancy related conditions, first trimester: Secondary | ICD-10-CM | POA: Insufficient documentation

## 2015-08-26 DIAGNOSIS — O9989 Other specified diseases and conditions complicating pregnancy, childbirth and the puerperium: Secondary | ICD-10-CM

## 2015-08-26 DIAGNOSIS — Z3A01 Less than 8 weeks gestation of pregnancy: Secondary | ICD-10-CM | POA: Insufficient documentation

## 2015-08-26 DIAGNOSIS — O26899 Other specified pregnancy related conditions, unspecified trimester: Secondary | ICD-10-CM

## 2015-08-26 LAB — CBC
HCT: 34.5 % — ABNORMAL LOW (ref 36.0–46.0)
Hemoglobin: 11.8 g/dL — ABNORMAL LOW (ref 12.0–15.0)
MCH: 26.7 pg (ref 26.0–34.0)
MCHC: 34.2 g/dL (ref 30.0–36.0)
MCV: 78.1 fL (ref 78.0–100.0)
Platelets: 317 10*3/uL (ref 150–400)
RBC: 4.42 MIL/uL (ref 3.87–5.11)
RDW: 13.1 % (ref 11.5–15.5)
WBC: 7.9 10*3/uL (ref 4.0–10.5)

## 2015-08-26 LAB — URINALYSIS, ROUTINE W REFLEX MICROSCOPIC
BILIRUBIN URINE: NEGATIVE
GLUCOSE, UA: NEGATIVE mg/dL
HGB URINE DIPSTICK: NEGATIVE
KETONES UR: NEGATIVE mg/dL
Nitrite: NEGATIVE
PROTEIN: NEGATIVE mg/dL
Specific Gravity, Urine: 1.025 (ref 1.005–1.030)
pH: 7 (ref 5.0–8.0)

## 2015-08-26 LAB — URINE MICROSCOPIC-ADD ON: RBC / HPF: NONE SEEN RBC/hpf (ref 0–5)

## 2015-08-26 LAB — HCG, QUANTITATIVE, PREGNANCY: hCG, Beta Chain, Quant, S: 293 m[IU]/mL — ABNORMAL HIGH (ref <5)

## 2015-08-26 LAB — POCT PREGNANCY, URINE: Preg Test, Ur: POSITIVE — AB

## 2015-08-26 NOTE — Discharge Instructions (Signed)
Ruptured Ectopic Pregnancy °An ectopic pregnancy is when the fertilized egg attaches (implants) outside the uterus. Most ectopic pregnancies occur in the fallopian tube. Rarely do ectopic pregnancies occur on the ovary, intestine, pelvis, or cervix. An ectopic pregnancy does not have the ability to develop into a normal, healthy baby.  °A ruptured ectopic pregnancy is one in which the fallopian tube gets torn or bursts and results in internal bleeding. Often there is intense abdominal pain, and sometimes, vaginal bleeding. Having an ectopic pregnancy can be a life-threatening experience. If left untreated, this dangerous condition can lead to a blood transfusion, abdominal surgery, or even death.  °CAUSES  °Damage to the fallopian tubes is the suspected cause in most ectopic pregnancies.  °RISK FACTORS °Depending on your circumstances, the amount of risk of having an ectopic pregnancy will vary. There are 3 categories that may help you identify whether you are potentially at risk. °High Risk °· You have gone through infertility treatment. °· You have had a previous ectopic pregnancy. °· You have had previous tubal surgery. °· You have had previous surgery to have the fallopian tubes tied (tubal ligation). °· You have tubal problems or diseases. °· You have been exposed to DES. DES is a medicine that was used until 1971 and had effects on babies whose mothers took the medicine. °· You become pregnant while using an intrauterine device (IUD) for birth control.  °Moderate Risk °· You have a history of infertility. °· You have a history of a sexually transmitted infection (STI). °· You have a history of pelvic inflammatory disease (PID). °· You have scarring from endometriosis. °· You have multiple sexual partners. °· You smoke.  °Low Risk °· You have had previous pelvic surgery. °· You use vaginal douching. °· You became sexually active before 20 years of age. °SYMPTOMS °An ectopic pregnancy should be suspected in  anyone who has missed a period and has abdominal pain or bleeding. °· You may experience normal pregnancy symptoms, such as: °¨ Nausea. °¨ Tiredness. °¨ Breast tenderness. °· Symptoms that are not normal include: °¨ Pain with intercourse. °¨ Irregular vaginal bleeding or spotting. °¨ Cramping or pain on one side, or in the lower abdomen. °¨ Fast heartbeat. °¨ Passing out while having a bowel movement. °· Symptoms of a ruptured ectopic pregnancy and internal bleeding may include: °¨ Sudden, severe pain in the abdomen and pelvis. °¨ Dizziness or fainting. °¨ Pain in the shoulder area. °DIAGNOSIS  °Tests that may be performed include: °· A pregnancy test. °· An ultrasound. °· Testing the specific level of pregnancy hormone in the bloodstream. °· Taking a sample of uterus tissue (dilation and curettage, D&C). °· Surgery to perform a visual exam of the inside of the abdomen using a lighted tube (laparoscopy). °TREATMENT  °Laparoscopic surgery or abdominal surgery is recommended for a ruptured ectopic pregnancy.  °· The whole fallopian tube may need to be removed (salpingectomy). °· If the tube is not too damaged, the tube may be saved, and the pregnancy will be surgically removed. In time, the tube may still function. °· If you have lost a lot of blood, you may need a blood transfusion. °· You may receive a Rho (D) immune globulin shot if you are Rh negative and the father is Rh positive, or if you do not know the Rh type of the father. This is to prevent problems with any future pregnancy. °SEEK IMMEDIATE MEDICAL CARE IF:  °You have any symptoms of an ectopic or ruptured ectopic pregnancy. This   is a medical emergency. °MAKE SURE YOU: °· Understand these instructions. °· Will watch your condition. °· Will get help right away if you are not doing well or get worse. °  °This information is not intended to replace advice given to you by your health care provider. Make sure you discuss any questions you have with your health  care provider. °  °Document Released: 06/05/2000 Document Revised: 06/13/2013 Document Reviewed: 03/20/2013 °Elsevier Interactive Patient Education ©2016 [employer] ° °

## 2015-08-26 NOTE — MAU Note (Signed)
Pt presents to MAU with lower abdominal cramping that started at the end of February. Positive pregnancy test last night. Denies any vaginal bleeding. Reports a small amount of clear vaginal discharge,

## 2015-08-26 NOTE — MAU Provider Note (Signed)
History     CSN: [id number]  Arrival date and time: 08/26/15 1534   First Provider Initiated Contact with Patient 08/26/15 1634      Chief Complaint  Patient presents with  . Abdominal Cramping  . Possible Pregnancy   HPI  Judy [name]  20 y.o. [id number] [redacted]w[redacted]d presents to the MAU stating that she hada positive home pregnancy test yesterday and has been having abdominal cramping for 2-3 weeks. No vaginal bleeding.  Past Medical History  Diagnosis Date  . Asthma   . Yeast infection of the vagina   . Post partum depression     Past Surgical History  Procedure Laterality Date  . No past surgeries      Family History  Problem Relation Age of Onset  . Hypertension Mother   . Diabetes Mother   . Asthma Father     Social History  Substance Use Topics  . Smoking status: Never Smoker   . Smokeless tobacco: Never Used  . Alcohol Use: No    Allergies: No Known Allergies  Facility-administered medications prior to admission  Medication Dose Route Frequency Provider Last Rate Last Dose  . etonogestrel (IMPLANON) implant 68 mg  68 mg Subcutaneous Once Brock Bad, MD       Prescriptions prior to admission  Medication Sig Dispense Refill Last Dose  . albuterol (PROVENTIL HFA;VENTOLIN HFA) 108 (90 BASE) MCG/ACT inhaler Inhale 2 puffs into the lungs every 6 (six) hours as needed for wheezing or shortness of breath.   Past Month at Unknown time  . sulfamethoxazole-trimethoprim (BACTRIM DS,SEPTRA DS) 800-160 MG tablet Take 1 tablet by mouth 2 (two) times daily.   08/26/2015 at Unknown time  . ibuprofen (ADVIL,MOTRIN) 800 MG tablet Take 1 tablet (800 mg total) by mouth every 8 (eight) hours as needed. (Patient not taking: Reported on 05/26/2015) 30 tablet 5 05/11/2015 at 2030    Review of Systems  Constitutional: Negative for fever.  Gastrointestinal: Positive for abdominal pain.  All other systems reviewed and are negative.  Physical Exam   Blood pressure 125/61,  pulse 82, temperature 98.3 F (36.8 C), resp. rate 18, height  (1.6 m), weight 114 lb (51.71 kg), last menstrual period 07/02/2015.  Physical Exam  Nursing note and vitals reviewed. Constitutional: She is oriented to person, place, and time. She appears well-developed and well-nourished. No distress.  HENT:  Head: Normocephalic and atraumatic.  Cardiovascular: Normal rate and regular rhythm.   Respiratory: Effort normal. No respiratory distress.  GI: Soft. She exhibits no distension and no mass. There is no tenderness. There is no rebound and no guarding.  Musculoskeletal: Normal range of motion.  Neurological: She is alert and oriented to person, place, and time.  Skin: Skin is warm and dry.  Psychiatric: She has a normal mood and affect. Her behavior is normal. Judgment and thought content normal.   US Ob Comp Less 14 Wks  08/26/2015  CLINICAL DATA:  Pregnant patient.  Lower abdominal cramping. EXAM: OBSTETRIC <14 WK [place] AND TRANSVAGINAL OB US TECHNIQUE: Both transabdominal and transvaginal ultrasound examinations were performed for complete evaluation of the gestation as well as the maternal uterus, adnexal regions, and pelvic cul-de-sac. Transvaginal technique was performed to assess early pregnancy. COMPARISON:  Pelvic ultrasound 07/19/2012 FINDINGS: Intrauterine gestational sac: Not present Yolk sac:  Not present Embryo:  Not present Cardiac Activity: Not present Maternal uterus/adnexae: Normal right ovary. Probable corpus luteum left ovary. Trace free fluid in the pelvis. IMPRESSION: No intrauterine gestation  identified. In the setting of positive pregnancy test and no definite intrauterine pregnancy, this reflects a pregnancy of unknown location. Differential considerations include early normal IUP, abnormal IUP, or nonvisualized ectopic pregnancy. Differentiation is achieved with serial beta HCG supplemented by repeat sonography as clinically warranted. Electronically Signed   By: Annia Belt M.D.   On: 08/26/2015 17:41   US Ob Transvaginal  08/26/2015  CLINICAL DATA:  Pregnant patient.  Lower abdominal cramping. EXAM: OBSTETRIC <14 WK [place] AND TRANSVAGINAL OB US TECHNIQUE: Both transabdominal and transvaginal ultrasound examinations were performed for complete evaluation of the gestation as well as the maternal uterus, adnexal regions, and pelvic cul-de-sac. Transvaginal technique was performed to assess early pregnancy. COMPARISON:  Pelvic ultrasound 07/19/2012 FINDINGS: Intrauterine gestational sac: Not present Yolk sac:  Not present Embryo:  Not present Cardiac Activity: Not present Maternal uterus/adnexae: Normal right ovary. Probable corpus luteum left ovary. Trace free fluid in the pelvis. IMPRESSION: No intrauterine gestation identified. In the setting of positive pregnancy test and no definite intrauterine pregnancy, this reflects a pregnancy of unknown location. Differential considerations include early normal IUP, abnormal IUP, or nonvisualized ectopic pregnancy. Differentiation is achieved with serial beta HCG supplemented by repeat sonography as clinically warranted. Electronically Signed   By: Annia Belt M.D.   On: 08/26/2015 17:41   Results for orders placed or performed during the hospital encounter of 08/26/15 (from the past 24 hour(s))  Urinalysis, Routine w reflex microscopic (not at [facility])     Status: Abnormal   Collection Time: 08/26/15  3:50 PM  Result Value Ref Range   Color, Urine AMBER (A) YELLOW   APPearance HAZY (A) CLEAR   Specific Gravity, Urine 1.025 1.005 - 1.030   pH 7.0 5.0 - 8.0   Glucose, UA NEGATIVE NEGATIVE mg/dL   Hgb urine dipstick NEGATIVE NEGATIVE   Bilirubin Urine NEGATIVE NEGATIVE   Ketones, ur NEGATIVE NEGATIVE mg/dL   Protein, ur NEGATIVE NEGATIVE mg/dL   Nitrite NEGATIVE NEGATIVE   Leukocytes, UA TRACE (A) NEGATIVE  Urine microscopic-add on     Status: Abnormal   Collection Time: 08/26/15  3:50 PM  Result Value Ref Range   Squamous  Epithelial / LPF 6-30 (A) NONE SEEN   WBC, UA 6-30 0 - 5 WBC/hpf   RBC / HPF NONE SEEN 0 - 5 RBC/hpf   Bacteria, UA FEW (A) NONE SEEN   Urine-Other MUCOUS PRESENT   Pregnancy, urine POC     Status: Abnormal   Collection Time: 08/26/15  4:21 PM  Result Value Ref Range   Preg Test, Ur POSITIVE (A) NEGATIVE  CBC     Status: Abnormal   Collection Time: 08/26/15  5:01 PM  Result Value Ref Range   WBC 7.9 4.0 - 10.5 K/uL   RBC 4.42 3.87 - 5.11 MIL/uL   Hemoglobin 11.8 (L) 12.0 - 15.0 g/dL   HCT 16.1 (L) 09.6 - 04.5 %   MCV 78.1 78.0 - 100.0 fL   MCH 26.7 26.0 - 34.0 pg   MCHC 34.2 30.0 - 36.0 g/dL   RDW 40.9 81.1 - 91.4 %   Platelets 317 150 - 400 K/uL  hCG, quantitative, pregnancy     Status: Abnormal   Collection Time: 08/26/15  5:01 PM  Result Value Ref Range   hCG, Beta Chain, Quant, S 293 (H) <5 mIU/mL    MAU Course  Procedures  MDM Pt will need to return to clinic to have follow up Beta HCG. Ectopic Precautions  Assessment and Plan  Pregnancy of unknown location [facility] 48 hours Wednesday 08/28/15 Discharge  Clemmons,Lori Grissett 08/26/2015, 6:02 PM

## 2015-08-28 ENCOUNTER — Encounter: Payer: Self-pay | Admitting: *Deleted

## 2015-08-28 ENCOUNTER — Ambulatory Visit (INDEPENDENT_AMBULATORY_CARE_PROVIDER_SITE_OTHER): Payer: Self-pay | Admitting: Advanced Practice Midwife

## 2015-08-28 DIAGNOSIS — O0281 Inappropriate change in quantitative human chorionic gonadotropin (hCG) in early pregnancy: Secondary | ICD-10-CM

## 2015-08-28 DIAGNOSIS — O3680X0 Pregnancy with inconclusive fetal viability, not applicable or unspecified: Secondary | ICD-10-CM

## 2015-08-28 LAB — HCG, QUANTITATIVE, PREGNANCY: hCG, Beta Chain, Quant, S: 939.4 m[IU]/mL — ABNORMAL HIGH

## 2015-08-28 NOTE — Progress Notes (Signed)
Judy [name]  is a 20 y.o. [id number] at 3948w1d by LMP who presents to the [facility] today for follow-up quant hCG after 48 hours. The patient was seen in MAU on 3/6 and had quant hCG of 293 and US showed no IUP no ectopic. She endorses mild cramping, unchanged from previous visit and denies vaginal bleeding or fever today.   OB History  Gravida Para Term Preterm AB SAB TAB Ectopic Multiple Living  3 2 1 1  0 0 0 0 0 2    # Outcome Date GA Lbr Len/2nd Weight Sex Delivery Anes PTL Lv  3 Current           2 Preterm 08/10/12 9033w6d 09:42 / 00:24 6 lb 4.7 oz (2.855 kg) Genella MechM Vag-Spont EPI  Y  1 Term 09/12/11 1347w4d 08:02 / 00:30 6 lb 1.4 oz (2.761 kg) F Vag-Spont EPI  Y     Comments: WNL      Past Medical History  Diagnosis Date  . Asthma   . Yeast infection of the vagina   . Post partum depression      LMP 07/02/2015 (Approximate)  CONSTITUTIONAL: Well-developed, well-nourished female in no acute distress.  ENT: External right and left ear normal.  EYES: EOM intact, conjunctivae normal.  MUSCULOSKELETAL: Normal range of motion.  CARDIOVASCULAR: Regular heart rate RESPIRATORY: Normal effort NEUROLOGICAL: Alert and oriented to person, place, and time.  SKIN: Skin is warm and dry. No rash noted. Not diaphoretic. No erythema. No pallor. PSYCH: Normal mood and affect. Normal behavior. Normal judgment and thought content.  Results for orders placed or performed in visit on 08/28/15 (from the past 24 hour(s))  hCG, quantitative, pregnancy     Status: Abnormal   Collection Time: 08/28/15 11:30 AM  Result Value Ref Range   hCG, Beta Chain, Quant, S 939.4 (H) mIU/mL   Narrative   Performed at:  [facility]                [address], [address]                [place], [place] [id number] SOURCE: BLD&BLOOD    A: Appropriate rise in quant hCG after 48 hours  P: Discharge home First trimester/ectopic precautions discussed Patient will return for follow-up US in  1 week. Order placed and RN to schedule. Patient will return to [facility] for results following US.  Patient may return to MAU as needed or if her condition were to change or worsen   Hurshel PartyLisa A Leftwich-Kirby, CNM 08/28/2015 5:07 PM

## 2015-09-04 ENCOUNTER — Ambulatory Visit (INDEPENDENT_AMBULATORY_CARE_PROVIDER_SITE_OTHER): Payer: Self-pay | Admitting: Family

## 2015-09-04 ENCOUNTER — Ambulatory Visit (HOSPITAL_COMMUNITY)
Admission: RE | Admit: 2015-09-04 | Discharge: 2015-09-04 | Disposition: A | Discharge status: Home / Self Care | Payer: Medicaid Other | Source: Ambulatory Visit | Attending: Advanced Practice Midwife | Admitting: Advanced Practice Midwife

## 2015-09-04 DIAGNOSIS — Z3A01 Less than 8 weeks gestation of pregnancy: Secondary | ICD-10-CM | POA: Insufficient documentation

## 2015-09-04 DIAGNOSIS — Z0189 Encounter for other specified special examinations: Secondary | ICD-10-CM

## 2015-09-04 DIAGNOSIS — O283 Abnormal ultrasonic finding on antenatal screening of mother: Secondary | ICD-10-CM | POA: Diagnosis not present

## 2015-09-04 DIAGNOSIS — O3680X0 Pregnancy with inconclusive fetal viability, not applicable or unspecified: Secondary | ICD-10-CM

## 2015-09-04 DIAGNOSIS — Z3481 Encounter for supervision of other normal pregnancy, first trimester: Secondary | ICD-10-CM

## 2015-09-04 NOTE — Progress Notes (Signed)
Ultrasounds Results Note  SUBJECTIVE HPI:  Judy [name] is a 20 y.o. [id number] at unknown gestational age who presents to the [facility] for followup ultrasound results. The patient denies abdominal pain or vaginal bleeding.   Upon review of the patient's records, patient was first seen in MAU on 08/26/15 for pelvic cramping.   BHCG on that day was 293.  Ultrasound showed IUGS, no yolk sac, no fetal pole.  Last seen on 08/28/15. BHCG was 939.4.  Repeat ultrasound was performed earlier today.   Past Medical History  Diagnosis Date  . Asthma   . Yeast infection of the vagina   . Post partum depression    Past Surgical History  Procedure Laterality Date  . No past surgeries     Social History   Social History  . Marital Status: Single    Spouse Name: N/A  . Number of Children: N/A  . Years of Education: N/A   Occupational History  . Not on file.   Social History Main Topics  . Smoking status: Never Smoker   . Smokeless tobacco: Never Used  . Alcohol Use: No  . Drug Use: No  . Sexual Activity: Yes    Birth Control/ Protection: None   Other Topics Concern  . Not on file   Social History Narrative   Current Outpatient Prescriptions on File Prior to Visit  Medication Sig Dispense Refill  . albuterol (PROVENTIL HFA;VENTOLIN HFA) 108 (90 BASE) MCG/ACT inhaler Inhale 2 puffs into the lungs every 6 (six) hours as needed for wheezing or shortness of breath.     No current facility-administered medications on file prior to visit.   No Known Allergies  I have reviewed patient's Past Medical Hx, Surgical Hx, Family Hx, Social Hx, medications and allergies.   Review of Systems Review of Systems  Constitutional: Negative for fever and chills.  Gastrointestinal: Negative for nausea, vomiting, abdominal pain, diarrhea and constipation.  Genitourinary: Negative for dysuria.  Musculoskeletal: Negative for back pain.  Neurological: Negative for dizziness and  weakness.    Physical Exam  LMP 07/02/2015 (Approximate)  GENERAL: Well-developed, well-nourished female in no acute distress.  HEENT: Normocephalic, atraumatic.   LUNGS: Effort normal ABDOMEN: soft, non-tender HEART: Regular rate  SKIN: Warm, dry and without erythema PSYCH: Normal mood and affect NEURO: Alert and oriented x 4  LAB RESULTS No results found for this or any previous visit (from the past 24 hour(s)).  IMAGING [place] Ob Comp Less 14 Wks  08/26/2015  CLINICAL DATA:  Pregnant patient.  Lower abdominal cramping. EXAM: OBSTETRIC <14 WK US AND TRANSVAGINAL OB US TECHNIQUE: Both transabdominal and transvaginal ultrasound examinations were performed for complete evaluation of the gestation as well as the maternal uterus, adnexal regions, and pelvic cul-de-sac. Transvaginal technique was performed to assess early pregnancy. COMPARISON:  Pelvic ultrasound 07/19/2012 FINDINGS: Intrauterine gestational sac: Not present Yolk sac:  Not present Embryo:  Not present Cardiac Activity: Not present Maternal uterus/adnexae: Normal right ovary. Probable corpus luteum left ovary. Trace free fluid in the pelvis. IMPRESSION: No intrauterine gestation identified. In the setting of positive pregnancy test and no definite intrauterine pregnancy, this reflects a pregnancy of unknown location. Differential considerations include early normal IUP, abnormal IUP, or nonvisualized ectopic pregnancy. Differentiation is achieved with serial beta HCG supplemented by repeat sonography as clinically warranted. Electronically Signed   By: Annia Beltrew  Davis M.D.   On: 08/26/2015 17:41   [place] Ob Transvaginal  09/04/2015  CLINICAL DATA:  Inconclusive  viability EXAM: TRANSVAGINAL OB ULTRASOUND TECHNIQUE: Transvaginal ultrasound was performed for complete evaluation of the gestation as well as the maternal uterus, adnexal regions, and pelvic cul-de-sac. COMPARISON:  08/26/2015 FINDINGS: Intrauterine gestational sac: Single Yolk sac:   Yes Embryo:  No Cardiac Activity: No Heart Rate: Not applicable bpm MSD: 10  mm   5 w   5  d Maternal uterus/adnexae: Subchorionic hemorrhage: None Right ovary: Normal Left ovary: Normal Other :None Free fluid:  None IMPRESSION: 1. Single intrauterine gestational sac containing a yolk sac. Probable early intrauterine gestational sac with yolk sac, but no fetal pole, or cardiac activity yet visualized. Recommend follow-up quantitative B-HCG levels and follow-up US in 14 days to confirm and assess viability. This recommendation follows SRU consensus guidelines: Diagnostic Criteria for Nonviable Pregnancy Early in the First Trimester. Malva Limes Med 2013; 782:9562-13. Electronically Signed   By: Signa Kell M.D.   On: 09/04/2015 10:40   US Ob Transvaginal  08/26/2015  CLINICAL DATA:  Pregnant patient.  Lower abdominal cramping. EXAM: OBSTETRIC <14 WK [place] AND TRANSVAGINAL OB US TECHNIQUE: Both transabdominal and transvaginal ultrasound examinations were performed for complete evaluation of the gestation as well as the maternal uterus, adnexal regions, and pelvic cul-de-sac. Transvaginal technique was performed to assess early pregnancy. COMPARISON:  Pelvic ultrasound 07/19/2012 FINDINGS: Intrauterine gestational sac: Not present Yolk sac:  Not present Embryo:  Not present Cardiac Activity: Not present Maternal uterus/adnexae: Normal right ovary. Probable corpus luteum left ovary. Trace free fluid in the pelvis. IMPRESSION: No intrauterine gestation identified. In the setting of positive pregnancy test and no definite intrauterine pregnancy, this reflects a pregnancy of unknown location. Differential considerations include early normal IUP, abnormal IUP, or nonvisualized ectopic pregnancy. Differentiation is achieved with serial beta HCG supplemented by repeat sonography as clinically warranted. Electronically Signed   By: Annia Belt M.D.   On: 08/26/2015 17:41    ASSESSMENT Early Pregnancy - approximately 5 wk  (pending US fetal pole)  PLAN Discharge home in stable condition Patient advised to start/continue taking prenatal vitamins Pregnancy confirmation letter given Patient advised to start prenatal care with [facility] provider of choice as soon as possible - Dr. Ann Lions, CNM  09/04/2015  11:36 AM

## 2015-09-13 NOTE — Telephone Encounter (Signed)
No return call to office. Call to be re-filed.

## 2015-09-24 ENCOUNTER — Ambulatory Visit (INDEPENDENT_AMBULATORY_CARE_PROVIDER_SITE_OTHER): Payer: Medicaid Other | Admitting: Obstetrics

## 2015-09-24 ENCOUNTER — Encounter: Payer: Self-pay | Admitting: Obstetrics

## 2015-09-24 VITALS — BP 108/60 | HR 90 | Temp 98.8°F | Wt 121.0 lb

## 2015-09-24 DIAGNOSIS — Z3491 Encounter for supervision of normal pregnancy, unspecified, first trimester: Secondary | ICD-10-CM

## 2015-09-24 LAB — POCT URINALYSIS DIPSTICK
Bilirubin, UA: NEGATIVE
Glucose, UA: NEGATIVE
KETONES UA: NEGATIVE
Leukocytes, UA: NEGATIVE
Nitrite, UA: NEGATIVE
PH UA: 6
PROTEIN UA: NEGATIVE
RBC UA: NEGATIVE
SPEC GRAV UA: 1.01
UROBILINOGEN UA: 0.2

## 2015-09-24 MED ORDER — VITAFOL GUMMIES 3.33-0.333-34.8 MG PO CHEW: 3.0000 | CHEWABLE_TABLET | Freq: Every day | ORAL | Status: DC

## 2015-09-24 NOTE — Progress Notes (Signed)
Patient states that when she eats a meal she's no longer hungry for the next meal. She has some mild cramping.98.8

## 2015-09-24 NOTE — Addendum Note (Signed)
Addended by: Elby BeckPAUL, JANE F on: 09/24/2015 04:12 PM   Modules accepted: Orders

## 2015-09-24 NOTE — Progress Notes (Signed)
  Subjective:    Judy [name] is a 20 y.o. female being seen today for her obstetrical visit. She is at 3365w0d gestation. Patient reports: no complaints.  Problem List Items Addressed This Visit    None    Visit Diagnoses    Prenatal care, first trimester    -  Primary    Relevant Medications    Prenatal Vit-Fe Phos-FA-Omega (VITAFOL GUMMIES) 3.33-0.333-34.8 MG CHEW    Other Relevant Orders    POCT urinalysis dipstick (Completed)      Patient Active Problem List   Diagnosis Date Noted  . Urinary tract infection, site not specified 12/14/2012  . Excessive or frequent menstruation 11/09/2012  . Dysmenorrhea 11/01/2012  . Vaginitis and vulvovaginitis, unspecified 11/01/2012  . Depressive disorder, not elsewhere classified 10/03/2012  . Subacute pyelonephritis 09/22/2012  . Normal delivery 09/12/2011  . Active labor 09/12/2011    Objective:     BP 108/60 mmHg  Pulse 90  Temp(Src) 98.8 F (37.1 C)  Wt 121 lb (54.885 kg)  LMP 07/02/2015 (Approximate) Uterine Size: Below umbilicus     Assessment:    Pregnancy @ 1865w0d  weeks Doing well    Plan:    Problem list reviewed and updated. Labs reviewed.  Follow up in 4 weeks. FIRST/CF mutation testing/NIPT/QUAD SCREEN/fragile X/[name] population testing/Spinal muscular atrophy discussed: requested. Role of ultrasound in pregnancy discussed; fetal survey: requested. Amniocentesis discussed: not indicated.

## 2015-09-26 LAB — PRENATAL PROFILE I(LABCORP)
ANTIBODY SCREEN: NEGATIVE
Basophils Absolute: 0 10*3/uL (ref 0.0–0.2)
Basos: 0 %
EOS (ABSOLUTE): 0 10*3/uL (ref 0.0–0.4)
EOS: 0 %
Hematocrit: 35.3 % (ref 34.0–46.6)
Hemoglobin: 12.1 g/dL (ref 11.1–15.9)
Hepatitis B Surface Ag: NEGATIVE
IMMATURE GRANS (ABS): 0 10*3/uL (ref 0.0–0.1)
IMMATURE GRANULOCYTES: 0 %
LYMPHS: 22 %
Lymphocytes Absolute: 1.6 10*3/uL (ref 0.7–3.1)
MCH: 27.3 pg (ref 26.6–33.0)
MCHC: 34.3 g/dL (ref 31.5–35.7)
MCV: 80 fL (ref 79–97)
Monocytes Absolute: 0.6 10*3/uL (ref 0.1–0.9)
Monocytes: 8 %
NEUTROS PCT: 70 %
Neutrophils Absolute: 5.2 10*3/uL (ref 1.4–7.0)
Platelets: 290 10*3/uL (ref 150–379)
RBC: 4.44 x10E6/uL (ref 3.77–5.28)
RDW: 13.3 % (ref 12.3–15.4)
RH TYPE: POSITIVE
RPR Ser Ql: NONREACTIVE
RUBELLA: 6.32 {index} (ref >0.99)
WBC: 7.4 10*3/uL (ref 3.4–10.8)

## 2015-09-26 LAB — HEMOGLOBINOPATHY EVALUATION
HEMOGLOBIN A2 QUANTITATION: 2.6 % (ref 0.7–3.1)
HGB A: 97.4 % (ref 94.0–98.0)
HGB C: 0 %
HGB S: 0 %
Hemoglobin F Quantitation: 0 % (ref 0.0–2.0)

## 2015-09-26 LAB — CULTURE, OB URINE

## 2015-09-26 LAB — URINE CULTURE, OB REFLEX

## 2015-09-26 LAB — VITAMIN D 25 HYDROXY (VIT D DEFICIENCY, FRACTURES): Vit D, 25-Hydroxy: 23.1 ng/mL — ABNORMAL LOW (ref 30.0–100.0)

## 2015-09-26 LAB — HIV ANTIBODY (ROUTINE TESTING W REFLEX): HIV Screen 4th Generation wRfx: NONREACTIVE

## 2015-10-09 ENCOUNTER — Inpatient Hospital Stay (HOSPITAL_COMMUNITY)
Admission: AD | Admit: 2015-10-09 | Discharge: 2015-10-09 | Disposition: A | Discharge status: Home / Self Care | Payer: Medicaid Other | Source: Ambulatory Visit | Attending: Obstetrics | Admitting: Obstetrics

## 2015-10-09 ENCOUNTER — Encounter (HOSPITAL_COMMUNITY): Payer: Self-pay | Admitting: *Deleted

## 2015-10-09 DIAGNOSIS — R42 Dizziness and giddiness: Secondary | ICD-10-CM | POA: Insufficient documentation

## 2015-10-09 DIAGNOSIS — R102 Pelvic and perineal pain: Secondary | ICD-10-CM | POA: Diagnosis not present

## 2015-10-09 DIAGNOSIS — O9989 Other specified diseases and conditions complicating pregnancy, childbirth and the puerperium: Secondary | ICD-10-CM | POA: Diagnosis not present

## 2015-10-09 DIAGNOSIS — N949 Unspecified condition associated with female genital organs and menstrual cycle: Secondary | ICD-10-CM | POA: Diagnosis not present

## 2015-10-09 DIAGNOSIS — J45909 Unspecified asthma, uncomplicated: Secondary | ICD-10-CM | POA: Diagnosis not present

## 2015-10-09 DIAGNOSIS — O26892 Other specified pregnancy related conditions, second trimester: Secondary | ICD-10-CM | POA: Diagnosis not present

## 2015-10-09 DIAGNOSIS — O210 Mild hyperemesis gravidarum: Secondary | ICD-10-CM | POA: Diagnosis not present

## 2015-10-09 DIAGNOSIS — Z3A14 14 weeks gestation of pregnancy: Secondary | ICD-10-CM | POA: Insufficient documentation

## 2015-10-09 DIAGNOSIS — O219 Vomiting of pregnancy, unspecified: Secondary | ICD-10-CM

## 2015-10-09 DIAGNOSIS — Z825 Family history of asthma and other chronic lower respiratory diseases: Secondary | ICD-10-CM | POA: Insufficient documentation

## 2015-10-09 LAB — URINALYSIS, ROUTINE W REFLEX MICROSCOPIC
Bilirubin Urine: NEGATIVE
Glucose, UA: NEGATIVE mg/dL
Hgb urine dipstick: NEGATIVE
KETONES UR: 15 mg/dL — AB
NITRITE: NEGATIVE
PROTEIN: NEGATIVE mg/dL
Specific Gravity, Urine: >1.03 — ABNORMAL HIGH (ref 1.005–1.030)
pH: 6 (ref 5.0–8.0)

## 2015-10-09 LAB — URINE MICROSCOPIC-ADD ON

## 2015-10-09 LAB — WET PREP, GENITAL
SPERM: NONE SEEN
TRICH WET PREP: NONE SEEN
Yeast Wet Prep HPF POC: NONE SEEN

## 2015-10-09 MED ORDER — PROMETHAZINE HCL 25 MG PO TABS: 12.5000 mg | ORAL_TABLET | Freq: Four times a day (QID) | ORAL | Status: DC | PRN

## 2015-10-09 MED ORDER — PROMETHAZINE HCL 25 MG PO TABS
25.0000 mg | ORAL_TABLET | Freq: Once | ORAL | Status: AC
  Administered 2015-10-09: 25 mg via ORAL
  Filled 2015-10-09: qty 1

## 2015-10-09 NOTE — MAU Provider Note (Signed)
History     CSN: [id number]  Arrival date and time: 10/09/15 4098   First Provider Initiated Contact with Patient 10/09/15 2011      Chief Complaint  Patient presents with  . Dizziness  . Pelvic Pain   Dizziness This is a new problem. The current episode started today. The problem occurs intermittently. The problem has been unchanged. Associated symptoms include abdominal pain, nausea and vomiting. Pertinent negatives include no chills or fever. Nothing aggravates the symptoms. She has tried nothing for the symptoms.  Pelvic Pain The patient's primary symptoms include pelvic pain. This is a new problem. The current episode started yesterday. The problem occurs intermittently. The problem has been unchanged. Pain severity now: 6/10  The problem affects both sides. She is pregnant. Associated symptoms include abdominal pain, constipation, nausea and vomiting. Pertinent negatives include no chills, diarrhea, dysuria, fever, frequency or urgency. The vaginal discharge was normal. There has been no bleeding. Nothing aggravates the symptoms. She has tried nothing for the symptoms. Menstrual history: LMP:07/02/15    Past Medical History  Diagnosis Date  . Asthma   . Yeast infection of the vagina   . Post partum depression     Past Surgical History  Procedure Laterality Date  . No past surgeries      Family History  Problem Relation Age of Onset  . Hypertension Mother   . Diabetes Mother   . Asthma Father     Social History  Substance Use Topics  . Smoking status: Never Smoker   . Smokeless tobacco: Never Used  . Alcohol Use: No    Allergies: No Known Allergies  Prescriptions prior to admission  Medication Sig Dispense Refill Last Dose  . Prenatal Vit-Fe Phos-FA-Omega (VITAFOL GUMMIES) 3.33-0.333-34.8 MG CHEW Chew 3 tablets by mouth daily. 90 tablet 11 10/09/2015 at Unknown time  . albuterol (PROVENTIL HFA;VENTOLIN HFA) 108 (90 BASE) MCG/ACT inhaler Inhale 2 puffs into the  lungs every 6 (six) hours as needed for wheezing or shortness of breath.   prn    Review of Systems  Constitutional: Negative for fever and chills.       "hot flashes"  Gastrointestinal: Positive for nausea, vomiting, abdominal pain and constipation. Negative for diarrhea.  Genitourinary: Positive for pelvic pain. Negative for dysuria, urgency and frequency.  Neurological: Positive for dizziness.   Physical Exam   Blood pressure 109/52, pulse 89, temperature 98.4 F (36.9 C), temperature source Oral, resp. rate 16, height  (1.6 m), weight 54.885 kg (121 lb), last menstrual period 07/02/2015.  Physical Exam  Nursing note and vitals reviewed. Constitutional: She is oriented to person, place, and time. She appears well-developed and well-nourished. No distress.  HENT:  Head: Normocephalic.  Cardiovascular: Normal rate.   Respiratory: Effort normal.  GI: Soft. There is no tenderness. There is no rebound.  Neurological: She is alert and oriented to person, place, and time.  Skin: Skin is warm and dry.  Psychiatric: She has a normal mood and affect.   Results for orders placed or performed during the hospital encounter of 10/09/15 (from the past 24 hour(s))  Urinalysis, Routine w reflex microscopic (not at [facility])     Status: Abnormal   Collection Time: 10/09/15  7:22 PM  Result Value Ref Range   Color, Urine YELLOW YELLOW   APPearance CLEAR CLEAR   Specific Gravity, Urine >1.030 (H) 1.005 - 1.030   pH 6.0 5.0 - 8.0   Glucose, UA NEGATIVE NEGATIVE mg/dL   Hgb urine dipstick  NEGATIVE NEGATIVE   Bilirubin Urine NEGATIVE NEGATIVE   Ketones, ur 15 (A) NEGATIVE mg/dL   Protein, ur NEGATIVE NEGATIVE mg/dL   Nitrite NEGATIVE NEGATIVE   Leukocytes, UA TRACE (A) NEGATIVE  Urine microscopic-add on     Status: Abnormal   Collection Time: 10/09/15  7:22 PM  Result Value Ref Range   Squamous Epithelial / LPF 6-30 (A) NONE SEEN   WBC, UA 6-30 0 - 5 WBC/hpf   RBC / HPF 0-5 0 - 5 RBC/hpf    Bacteria, UA FEW (A) NONE SEEN   Urine-Other MUCOUS PRESENT   Wet prep, genital     Status: Abnormal   Collection Time: 10/09/15  8:27 PM  Result Value Ref Range   Yeast Wet Prep HPF POC NONE SEEN NONE SEEN   Trich, Wet Prep NONE SEEN NONE SEEN   Clue Cells Wet Prep HPF POC PRESENT (A) NONE SEEN   WBC, Wet Prep HPF POC MANY (A) NONE SEEN   Sperm NONE SEEN      MAU Course  Procedures  MDM   Assessment and Plan   1. Round ligament pain   2. Nausea/vomiting in pregnancy    DC home Comfort measures reviewed  2nd Trimester precautions  RX: phenergan PRN #[id number]  Return to MAU as needed FU with OB as planned  Follow-up Information    Follow up with Brock BadHARPER,CHARLES A, MD.   Specialty:  Obstetrics and Gynecology   Why:  As scheduled   Contact information:   [address] [place] [address] [phone]         Tawnya CrookHogan, Heather Donovan 10/09/2015, 8:19 PM

## 2015-10-09 NOTE — MAU Note (Signed)
Patient presents at [redacted] weeks gestation with c/o dizziness since 0800 and lower abdominal pain since 1700 today. Flutter noted. Denies bleeding or discharge.

## 2015-10-09 NOTE — Discharge Instructions (Signed)
Morning Sickness Morning sickness is when you feel sick to your stomach (nauseous) during pregnancy. You may feel sick to your stomach and throw up (vomit). You may feel sick in the morning, but you can feel this way any time of day. Some women feel very sick to their stomach and cannot stop throwing up (hyperemesis gravidarum). HOME CARE  Only take medicines as told by your doctor.  Take multivitamins as told by your doctor. Taking multivitamins before getting pregnant can stop or lessen the harshness of morning sickness.  Eat dry toast or unsalted crackers before getting out of bed.  Eat 5 to 6 small meals a day.  Eat dry and bland foods like rice and baked potatoes.  Do not drink liquids with meals. Drink between meals.  Do not eat greasy, fatty, or spicy foods.  Have someone cook for you if the smell of food causes you to feel sick or throw up.  If you feel sick to your stomach after taking prenatal vitamins, take them at night or with a snack.  Eat protein when you need a snack (nuts, yogurt, cheese).  Eat unsweetened gelatins for dessert.  Wear a bracelet used for sea sickness (acupressure wristband).  Go to a doctor that puts thin needles into certain body points (acupuncture) to improve how you feel.  Do not smoke.  Use a humidifier to keep the air in your house free of odors.  Get lots of fresh air. GET HELP IF:  You need medicine to feel better.  You feel dizzy or lightheaded.  You are losing weight. GET HELP RIGHT AWAY IF:   You feel very sick to your stomach and cannot stop throwing up.  You pass out (faint). MAKE SURE YOU:  Understand these instructions.  Will watch your condition.  Will get help right away if you are not doing well or get worse.   This information is not intended to replace advice given to you by your health care provider. Make sure you discuss any questions you have with your health care provider.  Round Ligament Pain during  Pregnancy Many women will experience a type of pain referred to as round ligament pain during their pregnancy. This is associated with abdominal pain or discomfort. Since any type of abdominal pain during pregnancy can be disconcerting, it is important to talk about round ligament pain to relieve any anxiety or fears you may have regarding the symptoms you are feeling. Round ligament pain is due to normal changes that take place in the body during pregnancy. It is caused by stretching of the round ligaments attached to the uterus. More commonly it occurs on the right side of the pelvis. Round Ligament: An Overview Typically in the non-pregnant state the uterus is about the size of an apple or pear. There are thick ligaments which hold the uterus in place in the abdomen, referred to as round ligaments. During pregnancy, your uterus will expand in size and weight, and the ligaments supporting it will have to stretch, becoming longer and thinner. As these ligaments pull and tug they may irritate nearby nerve fibers, which causes pain. The severity of the pain in some cases can seem extreme. Some common symptoms of round ligament pain include:  Ligament spasms or contractions/cramps that trigger a sharp pain typically on the right side of the abdomen.  Pain upon waking or suddenly rolling over in your sleep.  Pain in the abdomen that is sharp brought on by exercise or other vigorous  activity. Similar Problems Round ligament pain is often mistaken for other medical conditions because the symptoms are similar. Acute abdominal pain during pregnancy may also be a sign of other conditions including:  Abdominal cramps - Some abdominal pain is simply caused by change in bowel habits associated with pregnancy. Gas is a common problem that can cause sharp, shooting pain. You should always seek out medical care if your pain is accompanied by fever, chills, pain upon urination or if you have  difficulty walking. Further exams and tests will be conducted to ensure that you do not have a more serious condition. It is not uncommon for women with lower abdominal pain to have a urinary tract infection, thus you may also be asked for a urine sample. Treatment If all other conditions are ruled out you can treat your round ligament pain relatively easily. You may be advised to take some acetaminophen (Tylenol) to reduce the severity of any persistent pain and asked to reduce your activity level. You can apply a heating pad to the area of pain or take a warm bath. Lying on the opposite side of the pain may help as well. Most women will find relief from round ligament pain simply by altering their daily routines slightly. The good news is round ligament pain will disappear completely once you have given birth to your child!

## 2015-10-10 LAB — GC/CHLAMYDIA PROBE AMP (~~LOC~~) NOT AT ARMC
Chlamydia: NEGATIVE
NEISSERIA GONORRHEA: NEGATIVE

## 2015-10-23 ENCOUNTER — Ambulatory Visit (INDEPENDENT_AMBULATORY_CARE_PROVIDER_SITE_OTHER): Payer: Medicaid Other | Admitting: Obstetrics

## 2015-10-23 VITALS — BP 108/59 | HR 83 | Temp 98.7°F | Wt 117.0 lb

## 2015-10-23 DIAGNOSIS — Z3492 Encounter for supervision of normal pregnancy, unspecified, second trimester: Secondary | ICD-10-CM | POA: Diagnosis not present

## 2015-10-23 NOTE — Progress Notes (Signed)
Patient no longer has nausea now having dizziness

## 2015-10-24 ENCOUNTER — Other Ambulatory Visit: Payer: Self-pay | Admitting: Certified Nurse Midwife

## 2015-10-24 ENCOUNTER — Encounter: Payer: Self-pay | Admitting: Obstetrics

## 2015-10-24 NOTE — Progress Notes (Signed)
  Subjective:    Judy [name] is a 20 y.o. female being seen today for her obstetrical visit. She is at 7422w2d gestation. Patient reports: no complaints.  Problem List Items Addressed This Visit    None    Visit Diagnoses    Prenatal care in second trimester    -  Primary    Relevant Orders    AFP, Quad Screen    US OB Comp + 14 Wk      Patient Active Problem List   Diagnosis Date Noted  . Urinary tract infection, site not specified 12/14/2012  . Excessive or frequent menstruation 11/09/2012  . Dysmenorrhea 11/01/2012  . Vaginitis and vulvovaginitis, unspecified 11/01/2012  . Depressive disorder, not elsewhere classified 10/03/2012  . Subacute pyelonephritis 09/22/2012  . Normal delivery 09/12/2011  . Active labor 09/12/2011    Objective:     BP 108/59 mmHg  Pulse 83  Temp(Src) 98.7 F (37.1 C)  Wt 117 lb (53.071 kg)  LMP 07/02/2015 (Approximate) Uterine Size: Below umbilicus     Assessment:    Pregnancy @ 4922w2d  weeks Doing well    Plan:    Problem list reviewed and updated. Labs reviewed.  Follow up in 4 weeks. FIRST/CF mutation testing/NIPT/QUAD SCREEN/fragile X/[name] population testing/Spinal muscular atrophy discussed: requested. Role of ultrasound in pregnancy discussed; fetal survey: requested. Amniocentesis discussed: not indicated.

## 2015-10-26 LAB — AFP, QUAD SCREEN
DIA VALUE (EIA): 299.04 pg/mL
Gestational Age: 16.1 WEEKS
MSAFP: 42.9 ng/mL
MSHCG: 151226 m[IU]/mL
Maternal Age At EDD: 20.7 YEARS
uE3 Value: 0.19 ng/mL

## 2015-10-27 ENCOUNTER — Inpatient Hospital Stay (HOSPITAL_COMMUNITY)
Admission: EM | Admit: 2015-10-27 | Discharge: 2015-10-28 | Disposition: A | Discharge status: Home / Self Care | Payer: Medicaid Other | Attending: Obstetrics | Admitting: Obstetrics

## 2015-10-27 ENCOUNTER — Encounter (HOSPITAL_COMMUNITY): Payer: Self-pay

## 2015-10-27 DIAGNOSIS — O4692 Antepartum hemorrhage, unspecified, second trimester: Secondary | ICD-10-CM

## 2015-10-27 DIAGNOSIS — N760 Acute vaginitis: Secondary | ICD-10-CM | POA: Diagnosis not present

## 2015-10-27 DIAGNOSIS — O23591 Infection of other part of genital tract in pregnancy, first trimester: Secondary | ICD-10-CM | POA: Insufficient documentation

## 2015-10-27 DIAGNOSIS — J45909 Unspecified asthma, uncomplicated: Secondary | ICD-10-CM | POA: Insufficient documentation

## 2015-10-27 DIAGNOSIS — O26851 Spotting complicating pregnancy, first trimester: Secondary | ICD-10-CM | POA: Diagnosis present

## 2015-10-27 DIAGNOSIS — B9689 Other specified bacterial agents as the cause of diseases classified elsewhere: Secondary | ICD-10-CM

## 2015-10-27 DIAGNOSIS — Z3A13 13 weeks gestation of pregnancy: Secondary | ICD-10-CM | POA: Diagnosis not present

## 2015-10-27 DIAGNOSIS — A499 Bacterial infection, unspecified: Secondary | ICD-10-CM | POA: Diagnosis not present

## 2015-10-27 DIAGNOSIS — O23592 Infection of other part of genital tract in pregnancy, second trimester: Secondary | ICD-10-CM | POA: Diagnosis not present

## 2015-10-27 LAB — URINALYSIS, ROUTINE W REFLEX MICROSCOPIC
BILIRUBIN URINE: NEGATIVE
GLUCOSE, UA: NEGATIVE mg/dL
HGB URINE DIPSTICK: NEGATIVE
KETONES UR: NEGATIVE mg/dL
Leukocytes, UA: NEGATIVE
Nitrite: NEGATIVE
PROTEIN: NEGATIVE mg/dL
Specific Gravity, Urine: 1.02 (ref 1.005–1.030)
pH: 6 (ref 5.0–8.0)

## 2015-10-27 NOTE — MAU Note (Signed)
Pt c/o vaginal spotting when wiping that started this morning-not currently spotting. Started having some lower abdominal cramping that started soon after. Rates 7/10. Did not taken anything for pain. Denies urinary s/s.

## 2015-10-28 ENCOUNTER — Encounter (HOSPITAL_COMMUNITY): Payer: Self-pay | Admitting: Family

## 2015-10-28 ENCOUNTER — Inpatient Hospital Stay (HOSPITAL_COMMUNITY): Payer: Medicaid Other

## 2015-10-28 DIAGNOSIS — N760 Acute vaginitis: Secondary | ICD-10-CM | POA: Diagnosis not present

## 2015-10-28 DIAGNOSIS — B9689 Other specified bacterial agents as the cause of diseases classified elsewhere: Secondary | ICD-10-CM | POA: Diagnosis not present

## 2015-10-28 DIAGNOSIS — A499 Bacterial infection, unspecified: Secondary | ICD-10-CM

## 2015-10-28 DIAGNOSIS — O4692 Antepartum hemorrhage, unspecified, second trimester: Secondary | ICD-10-CM | POA: Diagnosis not present

## 2015-10-28 DIAGNOSIS — O23592 Infection of other part of genital tract in pregnancy, second trimester: Secondary | ICD-10-CM | POA: Diagnosis not present

## 2015-10-28 DIAGNOSIS — O23591 Infection of other part of genital tract in pregnancy, first trimester: Secondary | ICD-10-CM | POA: Diagnosis not present

## 2015-10-28 DIAGNOSIS — O209 Hemorrhage in early pregnancy, unspecified: Secondary | ICD-10-CM | POA: Diagnosis not present

## 2015-10-28 DIAGNOSIS — J45909 Unspecified asthma, uncomplicated: Secondary | ICD-10-CM | POA: Diagnosis not present

## 2015-10-28 DIAGNOSIS — Z3A13 13 weeks gestation of pregnancy: Secondary | ICD-10-CM | POA: Diagnosis not present

## 2015-10-28 LAB — GC/CHLAMYDIA PROBE AMP (~~LOC~~) NOT AT ARMC
Chlamydia: NEGATIVE
Neisseria Gonorrhea: NEGATIVE

## 2015-10-28 LAB — WET PREP, GENITAL
SPERM: NONE SEEN
TRICH WET PREP: NONE SEEN
YEAST WET PREP: NONE SEEN

## 2015-10-28 MED ORDER — METRONIDAZOLE 500 MG PO TABS: 500.0000 mg | ORAL_TABLET | Freq: Two times a day (BID) | ORAL | Status: DC

## 2015-10-28 NOTE — MAU Provider Note (Signed)
History   [id number]   Chief Complaint  Patient presents with  . Vaginal Bleeding  . Abdominal Cramping    HPI Judy HSU is a 20 y.o. female  [phone] at [redacted]w[redacted]d IUP here with report of spotting of blood that started this morning.  Bleeding is described as light pink and less than a period.  +abdominal cramping started shortly after.  Pain is described as intermittent in nature.  Also reports white vaginal discharge, no odor or itching.     Patient's last menstrual period was 07/02/2015 (approximate).  OB History  Gravida Para Term Preterm AB SAB TAB Ectopic Multiple Living  0 0 0 0 0 2    # Outcome Date GA Lbr Len/2nd Weight Sex Delivery Anes PTL Lv  3 Current           2 Preterm 08/10/12 [redacted]w[redacted]d 09:42 / 00:24 2.855 kg (6 lb 4.7 oz) M Vag-Spont EPI  Y  1 Term 09/12/11 [redacted]w[redacted]d 08:02 / 00:30 2.761 kg (6 lb 1.4 oz) F Vag-Spont EPI  Y     Comments: WNL      Past Medical History  Diagnosis Date  . Asthma   . Yeast infection of the vagina   . Post partum depression     Family History  Problem Relation Age of Onset  . Hypertension Mother   . Diabetes Mother   . Asthma Father     Social History   Social History  . Marital Status: Single    Spouse Name: N/A  . Number of Children: N/A  . Years of Education: N/A   Social History Main Topics  . Smoking status: Never Smoker   . Smokeless tobacco: Never Used  . Alcohol Use: No  . Drug Use: No  . Sexual Activity: Yes    Birth Control/ Protection: None   Other Topics Concern  . None   Social History Narrative    No Known Allergies  No current facility-administered medications on file prior to encounter.   Current Outpatient Prescriptions on File Prior to Encounter  Medication Sig Dispense Refill  . albuterol (PROVENTIL HFA;VENTOLIN HFA) 108 (90 BASE) MCG/ACT inhaler Inhale 2 puffs into the lungs every 6 (six) hours as needed for wheezing or shortness of breath.    . Prenatal Vit-Fe Phos-FA-Omega  (VITAFOL GUMMIES) 3.33-0.333-34.8 MG CHEW Chew 3 tablets by mouth daily. 90 tablet 11  . promethazine (PHENERGAN) 25 MG tablet Take 0.5-1 tablets (12.5-25 mg total) by mouth every 6 (six) hours as needed. 30 tablet 0     Review of Systems  Constitutional: Negative for fever and chills.  Gastrointestinal: Negative for constipation and anal bleeding.  Genitourinary: Positive for vaginal bleeding, vaginal discharge and pelvic pain. Negative for dysuria, urgency, frequency and vaginal pain.  All other systems reviewed and are negative.    Physical Exam   Filed Vitals:   10/27/15 2240  BP: 112/47  Pulse: 79  Temp: 97.9 F (36.6 C)  TempSrc: Oral  Resp: 16  Height:  (1.6 m)  Weight: 48.988 kg (108 lb)  SpO2: 99%    Physical Exam  Constitutional: She is oriented to person, place, and time. She appears well-developed and well-nourished.  HENT:  Head: Normocephalic.  Neck: Normal range of motion. Neck supple.  Cardiovascular: Normal rate, regular rhythm and normal heart sounds.   Respiratory: Effort normal and breath sounds normal. No respiratory distress.  GI: Soft. There is no tenderness.  Genitourinary: Rectal exam shows  no external hemorrhoid and no internal hemorrhoid. Uterus is not tender. Cervix exhibits discharge (yellowish discharge) and friability. No bleeding in the vagina. Vaginal discharge (mucusy) found.  Musculoskeletal: Normal range of motion. She exhibits no edema.  Neurological: She is alert and oriented to person, place, and time.  Skin: Skin is warm and dry.    MAU Course  Procedures  GC/CT collected Limited OB ultrasound to assess placenta  MDM Results for orders placed or performed during the hospital encounter of 10/27/15 (from the past 24 hour(s))  Urinalysis, Routine w reflex microscopic (not [facility])     Status: Abnormal   Collection Time: 10/27/15 10:44 PM  Result Value Ref Range   Color, Urine YELLOW YELLOW   APPearance CLOUDY (A) CLEAR    Specific Gravity, Urine 1.020 1.005 - 1.030   pH 6.0 5.0 - 8.0   Glucose, UA NEGATIVE NEGATIVE mg/dL   Hgb urine dipstick NEGATIVE NEGATIVE   Bilirubin Urine NEGATIVE NEGATIVE   Ketones, ur NEGATIVE NEGATIVE mg/dL   Protein, ur NEGATIVE NEGATIVE mg/dL   Nitrite NEGATIVE NEGATIVE   Leukocytes, UA NEGATIVE NEGATIVE  Wet prep, genital     Status: Abnormal   Collection Time: 10/28/15 12:05 AM  Result Value Ref Range   Yeast Wet Prep HPF POC NONE SEEN NONE SEEN   Trich, Wet Prep NONE SEEN NONE SEEN   Clue Cells Wet Prep HPF POC PRESENT (A) NONE SEEN   WBC, Wet Prep HPF POC MANY (A) NONE SEEN   Sperm NONE SEEN   GC/Chlamydia Probe Amp *Canceled*     Status: None ()   Collection Time: 10/28/15 12:08 AM   Narrative   LIS Cancel (ORR/DE = Data Error)   Ultrasound: CRL: 72 mm 13 w 3 d US EDC: 05/01/2016  The estimated gestational age based on first ultrasound dated 09/04/2015 is 13 weeks, 2 days.  Subchorionic hemorrhage: None visualized.  Maternal uterus/adnexae: The left ovary appears unremarkable. The right ovary is not visualized.  IMPRESSION: Single live intrauterine pregnancy with an estimated gestational age of [redacted] weeks, 2 days based on first ultrasound.  Assessment and Plan  20 y.o. [id number] at 13.2 IUP  Bacterial Vaginosis  Plan: Discharge home RX Flagyl 500 mg BID Follow-up with Dr. Clearance CootsHarper if no improvement or worsening of symptoms   Marlis EdelsonWalidah N Karim, CNM 10/28/2015 1:20 AM

## 2015-10-28 NOTE — Discharge Instructions (Signed)

## 2015-11-14 ENCOUNTER — Ambulatory Visit (INDEPENDENT_AMBULATORY_CARE_PROVIDER_SITE_OTHER): Payer: Medicaid Other | Admitting: Obstetrics

## 2015-11-14 ENCOUNTER — Ambulatory Visit (INDEPENDENT_AMBULATORY_CARE_PROVIDER_SITE_OTHER): Payer: Medicaid Other

## 2015-11-14 ENCOUNTER — Encounter: Payer: Self-pay | Admitting: Obstetrics

## 2015-11-14 VITALS — BP 108/67 | HR 85 | Wt 111.0 lb

## 2015-11-14 DIAGNOSIS — Z36 Encounter for antenatal screening of mother: Secondary | ICD-10-CM

## 2015-11-14 DIAGNOSIS — Z3492 Encounter for supervision of normal pregnancy, unspecified, second trimester: Secondary | ICD-10-CM

## 2015-11-18 NOTE — Progress Notes (Signed)
  Subjective:    Judy [name] is a 20 y.o. female being seen today for her obstetrical visit. She is at 8741w3d gestation. Patient reports: no complaints.  Problem List Items Addressed This Visit    None    Visit Diagnoses    Prenatal care in second trimester    -  Primary    Relevant Orders    POCT Urinalysis Dipstick      Patient Active Problem List   Diagnosis Date Noted  . Urinary tract infection, site not specified 12/14/2012  . Excessive or frequent menstruation 11/09/2012  . Dysmenorrhea 11/01/2012  . Vaginitis and vulvovaginitis, unspecified 11/01/2012  . Depressive disorder, not elsewhere classified 10/03/2012  . Subacute pyelonephritis 09/22/2012  . Normal delivery 09/12/2011  . Active labor 09/12/2011    Objective:     BP 108/67 mmHg  Pulse 85  Wt 111 lb (50.349 kg)  LMP 07/02/2015 (Approximate) Uterine Size: Below umbilicus     Assessment:    Pregnancy @ 6241w3d  weeks Doing well    Plan:    Problem list reviewed and updated. Labs reviewed.  Follow up in 4 weeks. FIRST/CF mutation testing/NIPT/QUAD SCREEN/fragile X/[name] population testing/Spinal muscular atrophy discussed: requested. Role of ultrasound in pregnancy discussed; fetal survey: requested. Amniocentesis discussed: not indicated.

## 2015-11-26 ENCOUNTER — Ambulatory Visit: Payer: Medicaid Other | Admitting: Obstetrics

## 2015-12-05 ENCOUNTER — Telehealth: Payer: Self-pay | Admitting: *Deleted

## 2015-12-05 NOTE — Telephone Encounter (Signed)
Patient states she is 4 months pregnant and she is having decreased movement. 1:30 ATTEMPTED TO CALL PATIENT AT NUMBER GIVEN AND CELL NUMBER IN CHART- BOTH NUMBERS HAVE DISCONTINUED MESSAGE.

## 2015-12-06 ENCOUNTER — Encounter (HOSPITAL_COMMUNITY): Payer: Self-pay

## 2015-12-06 ENCOUNTER — Emergency Department (HOSPITAL_COMMUNITY)
Admission: EM | Admit: 2015-12-06 | Discharge: 2015-12-06 | Disposition: A | Discharge status: Home / Self Care | Payer: Medicaid Other | Attending: Emergency Medicine | Admitting: Emergency Medicine

## 2015-12-06 ENCOUNTER — Other Ambulatory Visit: Payer: Self-pay | Admitting: Certified Nurse Midwife

## 2015-12-06 DIAGNOSIS — O9989 Other specified diseases and conditions complicating pregnancy, childbirth and the puerperium: Secondary | ICD-10-CM | POA: Insufficient documentation

## 2015-12-06 DIAGNOSIS — O99512 Diseases of the respiratory system complicating pregnancy, second trimester: Secondary | ICD-10-CM | POA: Insufficient documentation

## 2015-12-06 DIAGNOSIS — R55 Syncope and collapse: Secondary | ICD-10-CM | POA: Insufficient documentation

## 2015-12-06 DIAGNOSIS — Z3A19 19 weeks gestation of pregnancy: Secondary | ICD-10-CM | POA: Insufficient documentation

## 2015-12-06 DIAGNOSIS — J45909 Unspecified asthma, uncomplicated: Secondary | ICD-10-CM | POA: Insufficient documentation

## 2015-12-06 LAB — BASIC METABOLIC PANEL
Anion gap: 6 (ref 5–15)
BUN: <5 mg/dL — ABNORMAL LOW (ref 6–20)
CO2: 23 mmol/L (ref 22–32)
Calcium: 8.5 mg/dL — ABNORMAL LOW (ref 8.9–10.3)
Chloride: 107 mmol/L (ref 101–111)
Creatinine, Ser: 0.63 mg/dL (ref 0.44–1.00)
GFR calc Af Amer: >60 mL/min (ref >60)
GFR calc non Af Amer: >60 mL/min (ref >60)
Glucose, Bld: 105 mg/dL — ABNORMAL HIGH (ref 65–99)
Potassium: 3.4 mmol/L — ABNORMAL LOW (ref 3.5–5.1)
Sodium: 136 mmol/L (ref 135–145)

## 2015-12-06 LAB — CBC
HCT: 32.6 % — ABNORMAL LOW (ref 36.0–46.0)
Hemoglobin: 11 g/dL — ABNORMAL LOW (ref 12.0–15.0)
MCH: 26.4 pg (ref 26.0–34.0)
MCHC: 33.7 g/dL (ref 30.0–36.0)
MCV: 78.4 fL (ref 78.0–100.0)
Platelets: 211 10*3/uL (ref 150–400)
RBC: 4.16 MIL/uL (ref 3.87–5.11)
RDW: 12.4 % (ref 11.5–15.5)
WBC: 7.2 10*3/uL (ref 4.0–10.5)

## 2015-12-06 LAB — URINALYSIS, ROUTINE W REFLEX MICROSCOPIC
Bilirubin Urine: NEGATIVE
Glucose, UA: NEGATIVE mg/dL
Hgb urine dipstick: NEGATIVE
Ketones, ur: NEGATIVE mg/dL
Nitrite: NEGATIVE
Protein, ur: NEGATIVE mg/dL
Specific Gravity, Urine: 1.015 (ref 1.005–1.030)
pH: 6.5 (ref 5.0–8.0)

## 2015-12-06 LAB — URINE MICROSCOPIC-ADD ON

## 2015-12-06 LAB — CBG MONITORING, ED: Glucose-Capillary: 135 mg/dL — ABNORMAL HIGH (ref 65–99)

## 2015-12-06 MED ORDER — SODIUM CHLORIDE 0.9 % IV BOLUS (SEPSIS)
1000.0000 mL | Freq: Once | INTRAVENOUS | Status: AC
  Administered 2015-12-06: 1000 mL via INTRAVENOUS

## 2015-12-06 MED ORDER — ACETAMINOPHEN 500 MG PO TABS
1000.0000 mg | ORAL_TABLET | Freq: Once | ORAL | Status: AC
  Administered 2015-12-06: 1000 mg via ORAL
  Filled 2015-12-06: qty 2

## 2015-12-06 NOTE — ED Provider Notes (Signed)
CSN: [id number]     Arrival date & time 12/06/15  1034 History   First MD Initiated Contact with Patient 12/06/15 1101     Chief Complaint  Patient presents with  . Dizziness     (Consider location/radiation/quality/duration/timing/severity/associated sxs/prior Treatment) HPI   Blood pressure 111/63, pulse 80, temperature 97.8 F (36.6 C), temperature source Oral, resp. rate 16, height 5\' 2"  (1.575 m), weight 53.252 kg, last menstrual period 07/02/2015, SpO2 100 %.  Vinie Silllisha M Torre is a 20 y.o. female who is [redacted] weeks pregnant Uncomplicated pregnancy, followed by Dr. Clearance CootsHarper with episode of non-melanotic stool onset this a.m., patient felt lightheaded like she was going to pass out all day, denies chest pain, has had bilateral foot swelling for a month, state they feel achy, she's having an exacerbation of her typical chronic migraine with photophobia, she took Tylenol 650 this a.m. with little relief. Patient denies chest pain, shortness of breath, palpitations, history of DVT/PE, calf pain or swelling, recent immobilizations. States she feels fetus is moving slightly less than normal and has some mild lower abdominal pressure over the last several weeks with no vaginal bleeding, no abdominal pain, no abnormal vaginal discharge, dysuria, hematuria, abnormal urinary frequency  Past Medical History  Diagnosis Date  . Asthma   . Yeast infection of the vagina   . Post partum depression    Past Surgical History  Procedure Laterality Date  . No past surgeries     Family History  Problem Relation Age of Onset  . Hypertension Mother   . Diabetes Mother   . Asthma Father    Social History  Substance Use Topics  . Smoking status: Never Smoker   . Smokeless tobacco: Never Used  . Alcohol Use: No   OB History    Gravida Para Term Preterm AB TAB SAB Ectopic Multiple Living   3 2 1 1  0 0 0 0 0 2     Review of Systems  10 systems reviewed and found to be negative, except as noted in  the HPI.   Allergies  Review of patient's allergies indicates no known allergies.  Home Medications   Prior to Admission medications   Medication Sig Start Date End Date Taking? Authorizing Provider  acetaminophen (TYLENOL) 325 MG tablet Take 650 mg by mouth every 6 (six) hours as needed for moderate pain.   Yes Historical Provider, MD  albuterol (PROVENTIL HFA;VENTOLIN HFA) 108 (90 BASE) MCG/ACT inhaler Inhale 2 puffs into the lungs every 6 (six) hours as needed for wheezing or shortness of breath.   Yes Historical Provider, MD  Prenatal Vit-Fe Phos-FA-Omega (VITAFOL GUMMIES) 3.33-0.333-34.8 MG CHEW Chew 3 tablets by mouth daily. 09/24/15  Yes Brock Badharles A Harper, MD   BP 98/47 mmHg  Pulse 85  Temp(Src) 97.8 F (36.6 C) (Oral)  Resp 16  Ht 5\' 2"  (1.575 m)  Wt 53.252 kg  BMI 21.47 kg/m2  SpO2 98%  LMP 07/02/2015 (Approximate) Physical Exam  Constitutional: She is oriented to person, place, and time. She appears well-developed and well-nourished. No distress.  HENT:  Head: Normocephalic.  Mouth/Throat: Oropharynx is clear and moist.  Eyes: Conjunctivae are normal.  Neck: Normal range of motion. No JVD present. No tracheal deviation present.  Cardiovascular: Normal rate, regular rhythm and intact distal pulses.   Radial pulse equal bilaterally  Pulmonary/Chest: Effort normal and breath sounds normal. No stridor. No respiratory distress. She has no wheezes. She has no rales. She exhibits no tenderness.  Abdominal: Soft. She  exhibits no distension and no mass. There is no tenderness. There is no rebound and no guarding.  Gravid uterus, fundus below umbilicus. No tenderness palpation of any quadrant. No CVA tenderness to percussion.  Musculoskeletal: Normal range of motion. She exhibits no edema or tenderness.  No calf asymmetry, superficial collaterals, palpable cords, edema, Homans sign negative bilaterally.    Neurological: She is alert and oriented to person, place, and time.  Skin:  Skin is warm. She is not diaphoretic.  Psychiatric: She has a normal mood and affect.  Nursing note and vitals reviewed.   ED Course  Procedures (including critical care time) Labs Review Labs Reviewed  BASIC METABOLIC PANEL - Abnormal; Notable for the following:    Potassium 3.4 (*)    Glucose, Bld 105 (*)    BUN <5 (*)    Calcium 8.5 (*)    All other components within normal limits  CBC - Abnormal; Notable for the following:    Hemoglobin 11.0 (*)    HCT 32.6 (*)    All other components within normal limits  URINALYSIS, ROUTINE W REFLEX MICROSCOPIC (NOT AT [facility]) - Abnormal; Notable for the following:    Leukocytes, UA TRACE (*)    All other components within normal limits  URINE MICROSCOPIC-ADD ON - Abnormal; Notable for the following:    Squamous Epithelial / LPF 0-5 (*)    Bacteria, UA FEW (*)    All other components within normal limits  CBG MONITORING, ED - Abnormal; Notable for the following:    Glucose-Capillary 135 (*)    All other components within normal limits  URINE CULTURE    Imaging Review No results found. I have personally reviewed and evaluated these images and lab results as part of my medical decision-making.   EKG Interpretation   Date/Time:  Friday December 06 2015 11:00:35 EDT Ventricular Rate:  90 PR Interval:  148 QRS Duration: 80 QT Interval:  360 QTC Calculation: 440 R Axis:   70 Text Interpretation:  Normal sinus rhythm Non-specific ST-t changes  Confirmed by Juleen China  MD, STEPHEN ([id number]) on 12/06/2015 11:47:19 AM      MDM   Final diagnoses:  Pre-syncope    Filed Vitals:   12/06/15 1300 12/06/15 1315 12/06/15 1330 12/06/15 1400  BP: 111/64 102/87 106/63 98/47  Pulse: 89 86 87 85  Temp:      TempSrc:      Resp:      Height:      Weight:      SpO2: 100% 100% 99% 98%    Medications  sodium chloride 0.9 % bolus 1,000 mL (1,000 mLs Intravenous New Bag/Given 12/06/15 1411)  acetaminophen (TYLENOL) tablet 1,000 mg (1,000 mg Oral Given  12/06/15 1411)    Vinie Sill is 20 y.o. female presenting with Lightheaded sensation, she is [redacted] weeks pregnant. No vaginal discharge she has some lower abdominal pressure, normal fetal heart tones at greater than 150, blood work is reassuring, we'll bolus him think she is likely mildly dehydrated. Urinalysis with no white cells and a few bacteria trace leukocytes, she has no symptoms, we'll culture given her pregnancy. EKG with no arrhythmia. Physical exam is not consistent with DVT, I doubt this is a PE.  Patient will be written off work for 2 days, advised her to follow closely with OB/GYN.  Evaluation does not show pathology that would require ongoing emergent intervention or inpatient treatment. Pt is hemodynamically stable and mentating appropriately. Discussed findings and plan with patient/guardian,  who agrees with care plan. All questions answered. Return precautions discussed and outpatient follow up given.       Wynetta Emery, PA-C 12/06/15 1524  Raeford Razor, MD 12/13/15 2207

## 2015-12-06 NOTE — ED Notes (Signed)
Patient Alert and oriented X4. Stable and ambulatory. Patient verbalized understanding of the discharge instructions.  Patient belongings were taken by the patient.  

## 2015-12-06 NOTE — ED Notes (Signed)
Per pt, Pt was working when she started feel dizzy and lightheaded. Pt is [redacted] weeks pregnant. Reports headache that started with dizziness, and ankle swelling with pregnancy

## 2015-12-06 NOTE — Discharge Instructions (Signed)
Push fluids  Follow with OB/GYN in the next 2-3 days.  Do not hesitate to return to the emergency room for any new, worsening or concerning symptoms. Near-Syncope Near-syncope (commonly known as near fainting) is sudden weakness, dizziness, or feeling like you might pass out. During an episode of near-syncope, you may also develop pale skin, have tunnel vision, or feel sick to your stomach (nauseous). Near-syncope may occur when getting up after sitting or while standing for a long time. It is caused by a sudden decrease in blood flow to the brain. This decrease can result from various causes or triggers, most of which are not serious. However, because near-syncope can sometimes be a sign of something serious, a medical evaluation is required. The specific cause is often not determined. HOME CARE INSTRUCTIONS  Monitor your condition for any changes. The following actions may help to alleviate any discomfort you are experiencing:  Have someone stay with you until you feel stable.  Lie down right away and prop your feet up if you start feeling like you might faint. Breathe deeply and steadily. Wait until all the symptoms have passed. Most of these episodes last only a few minutes. You may feel tired for several hours.   Drink enough fluids to keep your urine clear or pale yellow.   If you are taking blood pressure or heart medicine, get up slowly when seated or lying down. Take several minutes to sit and then stand. This can reduce dizziness.  Follow up with your health care provider as directed. SEEK IMMEDIATE MEDICAL CARE IF:   You have a severe headache.   You have unusual pain in the chest, abdomen, or back.   You are bleeding from the mouth or rectum, or you have black or tarry stool.   You have an irregular or very fast heartbeat.   You have repeated fainting or have seizure-like jerking during an episode.   You faint when sitting or lying down.   You have confusion.    You have difficulty walking.   You have severe weakness.   You have vision problems.  MAKE SURE YOU:   Understand these instructions.  Will watch your condition.  Will get help right away if you are not doing well or get worse.   This information is not intended to replace advice given to you by your health care provider. Make sure you discuss any questions you have with your health care provider.   Document Released: 06/08/2005 Document Revised: 06/13/2013 Document Reviewed: 11/11/2012 Elsevier Interactive Patient Education [employer]2016 Elsevier Inc.

## 2015-12-06 NOTE — ED Notes (Signed)
Fetal heart tone monitor at bedside.

## 2015-12-09 ENCOUNTER — Encounter: Payer: Self-pay | Admitting: *Deleted

## 2015-12-09 ENCOUNTER — Telehealth: Payer: Self-pay | Admitting: *Deleted

## 2015-12-09 LAB — URINE CULTURE

## 2015-12-09 NOTE — Telephone Encounter (Signed)
Patient called to let us know she had a dizzy spell at work. She is requesting that her hours be cut to part time because she is not feeling safe working a full time schedule. Letter composed and patient to pick up tomorrow.

## 2015-12-10 ENCOUNTER — Telehealth (HOSPITAL_BASED_OUTPATIENT_CLINIC_OR_DEPARTMENT_OTHER): Payer: Self-pay | Admitting: Emergency Medicine

## 2015-12-10 NOTE — Telephone Encounter (Signed)
Post ED Visit - Positive Culture Follow-up: Successful Patient Follow-Up  Culture assessed and recommendations reviewed by: []  Enzo BiNathan Batchelder, Pharm.D. []  Celedonio MiyamotoJeremy Frens, Pharm.D., BCPS []  Garvin FilaMike Maccia, Pharm.D. []  Georgina PillionElizabeth Martin, Pharm.D., BCPS []  [place]oydsvilleMinh Pham, [address] Rainbow BoulevardPharm.D., BCPS, AAHIVP []  Estella HuskMichelle Turner, Pharm.D., BCPS, AAHIVP [x]  Tennis Mustassie Stewart, Pharm.D. []  Sherle Poeob Vincent, [place]arm[place]D.  Positive urine culture  [x]  Patient discharged without antimicrobial prescription and treatment is now indicated []  Organism is resistant to prescribed ED discharge antimicrobial []  Patient with positive blood cultures  Changes discussed with ED provider: Harolyn RutherfordShawn Joy PA New antibiotic prescription start Cephalexin 500mg  po bid x 5 days  Attempting to contact patient   Judy [name], Judy [name] 12/10/2015, 10:32 AM

## 2015-12-10 NOTE — Progress Notes (Signed)
ED Antimicrobial Stewardship Positive Culture Follow Up   Judy [name] is an 20 y.o. female who [facility] with a chief complaint of  Chief Complaint  Patient presents with  . Dizziness    Recent Results (from the past 720 hour(s))  Urine culture     Status: Abnormal   Collection Time: 12/06/15 12:45 PM  Result Value Ref Range Status   Specimen Description URINE, CLEAN CATCH  Final   Special Requests NONE  Final   Culture >=100,000 COLONIES/mL KLEBSIELLA PNEUMONIAE (A)  Final   Report Status 12/09/2015 FINAL  Final   Organism ID, Bacteria KLEBSIELLA PNEUMONIAE (A)  Final      Susceptibility   Klebsiella pneumoniae - MIC*    AMPICILLIN 16 RESISTANT Resistant     CEFAZOLIN <=4 SENSITIVE Sensitive     CEFTRIAXONE <=1 SENSITIVE Sensitive     CIPROFLOXACIN <=0.25 SENSITIVE Sensitive     GENTAMICIN <=1 SENSITIVE Sensitive     IMIPENEM <=0.25 SENSITIVE Sensitive     NITROFURANTOIN 256 RESISTANT Resistant     TRIMETH/SULFA <=20 SENSITIVE Sensitive     AMPICILLIN/SULBACTAM <=2 SENSITIVE Sensitive     PIP/TAZO <=4 SENSITIVE Sensitive     * >=100,000 COLONIES/mL KLEBSIELLA PNEUMONIAE     [x]  Patient discharged originally without antimicrobial agent and treatment is now indicated  New antibiotic prescription: Cephalexin 500 mg PO BID x 5 days  ED Provider: Harolyn RutherfordShawn Joy, PA-C  Cassie L. Roseanne RenoStewart, PharmD PGY2 Infectious Diseases Pharmacy Resident Pager: [phone] 12/10/2015 9:32 AM .

## 2015-12-12 ENCOUNTER — Encounter: Payer: Self-pay | Admitting: Obstetrics

## 2015-12-12 ENCOUNTER — Ambulatory Visit (INDEPENDENT_AMBULATORY_CARE_PROVIDER_SITE_OTHER): Payer: Medicaid Other | Admitting: Obstetrics

## 2015-12-12 VITALS — BP 103/56 | HR 93 | Temp 98.6°F | Wt 117.0 lb

## 2015-12-12 DIAGNOSIS — N760 Acute vaginitis: Secondary | ICD-10-CM

## 2015-12-12 DIAGNOSIS — B373 Candidiasis of vulva and vagina: Secondary | ICD-10-CM

## 2015-12-12 DIAGNOSIS — A499 Bacterial infection, unspecified: Secondary | ICD-10-CM

## 2015-12-12 DIAGNOSIS — B9689 Other specified bacterial agents as the cause of diseases classified elsewhere: Secondary | ICD-10-CM

## 2015-12-12 DIAGNOSIS — G44009 Cluster headache syndrome, unspecified, not intractable: Secondary | ICD-10-CM

## 2015-12-12 DIAGNOSIS — B3731 Acute candidiasis of vulva and vagina: Secondary | ICD-10-CM

## 2015-12-12 MED ORDER — TERCONAZOLE 0.4 % VA CREA: 1.0000 | TOPICAL_CREAM | Freq: Every day | VAGINAL | Status: DC

## 2015-12-12 MED ORDER — CLINDAMYCIN HCL 300 MG PO CAPS: 300.0000 mg | ORAL_CAPSULE | Freq: Three times a day (TID) | ORAL | Status: DC

## 2015-12-12 MED ORDER — BUTALBITAL-APAP-CAFFEINE 50-325-40 MG PO TABS: 2.0000 | ORAL_TABLET | Freq: Four times a day (QID) | ORAL | Status: DC | PRN

## 2015-12-12 NOTE — Progress Notes (Signed)
Subjective:    Judy [name] is a 20 y.o. female being seen today for her obstetrical visit. She is at 6414w6d gestation. Patient reports: headache . Fetal movement: normal.  Problem List Items Addressed This Visit    None    Visit Diagnoses    Cluster headache, not intractable, unspecified chronicity pattern    -  Primary    Relevant Medications    butalbital-acetaminophen-caffeine (FIORICET) 50-325-40 MG tablet    BV (bacterial vaginosis)        Relevant Medications    clindamycin (CLEOCIN) 300 MG capsule    Candida vaginitis        Relevant Medications    butalbital-acetaminophen-caffeine (FIORICET) 50-325-40 MG tablet    terconazole (TERAZOL 7) 0.4 % vaginal cream    clindamycin (CLEOCIN) 300 MG capsule      Patient Active Problem List   Diagnosis Date Noted  . Urinary tract infection, site not specified 12/14/2012  . Excessive or frequent menstruation 11/09/2012  . Dysmenorrhea 11/01/2012  . Vaginitis and vulvovaginitis, unspecified 11/01/2012  . Depressive disorder, not elsewhere classified 10/03/2012  . Subacute pyelonephritis 09/22/2012  . Normal delivery 09/12/2011  . Active labor 09/12/2011   Objective:    BP 103/56 mmHg  Pulse 93  Temp(Src) 98.6 F (37 C)  Wt 117 lb (53.071 kg)  LMP 07/02/2015 (Approximate) FHT: 150 BPM  Uterine Size: size equals dates     Assessment:    Pregnancy @ 1814w6d    Plan:    Signs and symptoms of preterm labor: discussed.  Labs, problem list reviewed and updated 2 hr GTT planned Follow up in 4 weeks.

## 2016-01-08 ENCOUNTER — Encounter: Payer: Medicaid Other | Admitting: Obstetrics

## 2016-01-16 ENCOUNTER — Encounter: Payer: Self-pay | Admitting: Obstetrics

## 2016-01-16 ENCOUNTER — Ambulatory Visit (INDEPENDENT_AMBULATORY_CARE_PROVIDER_SITE_OTHER): Payer: Medicaid Other | Admitting: Obstetrics

## 2016-01-16 ENCOUNTER — Inpatient Hospital Stay (HOSPITAL_COMMUNITY): Payer: Medicaid Other

## 2016-01-16 ENCOUNTER — Inpatient Hospital Stay (HOSPITAL_COMMUNITY)
Admission: AD | Admit: 2016-01-16 | Discharge: 2016-01-16 | Disposition: A | Discharge status: Home / Self Care | Payer: Medicaid Other | Source: Ambulatory Visit | Attending: Obstetrics and Gynecology | Admitting: Obstetrics and Gynecology

## 2016-01-16 ENCOUNTER — Encounter (HOSPITAL_COMMUNITY): Payer: Self-pay | Admitting: *Deleted

## 2016-01-16 VITALS — BP 100/60 | HR 73 | Temp 97.9°F | Wt 122.1 lb

## 2016-01-16 DIAGNOSIS — Z3A36 36 weeks gestation of pregnancy: Secondary | ICD-10-CM | POA: Diagnosis not present

## 2016-01-16 DIAGNOSIS — O26893 Other specified pregnancy related conditions, third trimester: Secondary | ICD-10-CM | POA: Insufficient documentation

## 2016-01-16 DIAGNOSIS — R109 Unspecified abdominal pain: Secondary | ICD-10-CM

## 2016-01-16 DIAGNOSIS — J45909 Unspecified asthma, uncomplicated: Secondary | ICD-10-CM | POA: Diagnosis not present

## 2016-01-16 DIAGNOSIS — M79604 Pain in right leg: Secondary | ICD-10-CM | POA: Insufficient documentation

## 2016-01-16 DIAGNOSIS — Z3A24 24 weeks gestation of pregnancy: Secondary | ICD-10-CM

## 2016-01-16 DIAGNOSIS — M79605 Pain in left leg: Secondary | ICD-10-CM | POA: Insufficient documentation

## 2016-01-16 DIAGNOSIS — O9989 Other specified diseases and conditions complicating pregnancy, childbirth and the puerperium: Secondary | ICD-10-CM | POA: Diagnosis not present

## 2016-01-16 DIAGNOSIS — Z79899 Other long term (current) drug therapy: Secondary | ICD-10-CM | POA: Diagnosis not present

## 2016-01-16 DIAGNOSIS — O26899 Other specified pregnancy related conditions, unspecified trimester: Secondary | ICD-10-CM

## 2016-01-16 DIAGNOSIS — R1084 Generalized abdominal pain: Secondary | ICD-10-CM | POA: Diagnosis not present

## 2016-01-16 DIAGNOSIS — N949 Unspecified condition associated with female genital organs and menstrual cycle: Secondary | ICD-10-CM

## 2016-01-16 DIAGNOSIS — R102 Pelvic and perineal pain: Secondary | ICD-10-CM

## 2016-01-16 DIAGNOSIS — Z3492 Encounter for supervision of normal pregnancy, unspecified, second trimester: Secondary | ICD-10-CM

## 2016-01-16 DIAGNOSIS — O99513 Diseases of the respiratory system complicating pregnancy, third trimester: Secondary | ICD-10-CM | POA: Insufficient documentation

## 2016-01-16 LAB — URINALYSIS, ROUTINE W REFLEX MICROSCOPIC
Bilirubin Urine: NEGATIVE
Glucose, UA: 250 mg/dL — AB
Hgb urine dipstick: NEGATIVE
KETONES UR: NEGATIVE mg/dL
LEUKOCYTES UA: NEGATIVE
NITRITE: NEGATIVE
PH: 6 (ref 5.0–8.0)
PROTEIN: NEGATIVE mg/dL
Specific Gravity, Urine: 1.015 (ref 1.005–1.030)

## 2016-01-16 LAB — FETAL FIBRONECTIN: Fetal Fibronectin: NEGATIVE

## 2016-01-16 LAB — POCT URINALYSIS DIPSTICK
BILIRUBIN UA: NEGATIVE
Blood, UA: NEGATIVE
Glucose, UA: NEGATIVE
Ketones, UA: NEGATIVE
LEUKOCYTES UA: NEGATIVE
NITRITE UA: NEGATIVE
PH UA: 8
PROTEIN UA: NEGATIVE
Spec Grav, UA: <=1.005
Urobilinogen, UA: NEGATIVE

## 2016-01-16 NOTE — Progress Notes (Signed)
Subjective:    Judy [name] is a 20 y.o. female being seen today for her obstetrical visit. She is at [redacted]w[redacted]d gestation. Patient reports: contractions since this am . Fetal movement: normal.  Problem List Items Addressed This Visit    None    Visit Diagnoses    Prenatal care, second trimester    -  Primary   Relevant Orders   POCT urinalysis dipstick   Fetal nonstress test     Patient Active Problem List   Diagnosis Date Noted  . Urinary tract infection, site not specified 12/14/2012  . Excessive or frequent menstruation 11/09/2012  . Dysmenorrhea 11/01/2012  . Vaginitis and vulvovaginitis, unspecified 11/01/2012  . Depressive disorder, not elsewhere classified 10/03/2012  . Subacute pyelonephritis 09/22/2012  . Normal delivery 09/12/2011  . Active labor 09/12/2011   Objective:    BP 100/60   Pulse 73   Temp 97.9 F (36.6 C)   Wt 122 lb 1.6 oz (55.4 kg)   LMP 07/02/2015 (Approximate)   BMI 22.33 kg/m  FHT: 150 BPM  Uterine Size: size equals dates    NST:  UC's q 3-5 min., Reactive, no decels  Assessment:    Pregnancy @ [redacted]w[redacted]d     Preterm UC's  Plan:    Sent to [facility] for further evaluation  Signs and symptoms of preterm labor: discussed.  Labs, problem list reviewed and updated 2 hr GTT planned Follow up in 1 weeks.

## 2016-01-16 NOTE — MAU Provider Note (Signed)
History     CSN: [id number]  Arrival date and time: 01/16/16 1613   First Provider Initiated Contact with Patient 01/16/16 1733      Chief Complaint  Patient presents with  . Contractions   HPI Judy [name] is a 20 y.o. [id number] at [redacted]w[redacted]d who presents from office for preterm labor evaluation. Was seen in office at 0930 this morning & reportedly had contractions on monitor; pt went home to take a nap before coming to MAU.  Symptoms began 2 weeks ago. Reports lower abdominal pain that is intermittent & sharp. Pain is worse with moving & walking. Rates pain 8/10. Has not treated. Also reports abdominal tightening every 20 minutes today. Denies vaginal bleeding or LOF. Positive fetal movement. Denies recent intercourse.   OB History    Gravida Para Term Preterm AB Living   0 2   SAB TAB Ectopic Multiple Live Births   0 0 0 0 2      Past Medical History:  Diagnosis Date  . Asthma   . Post partum depression   . Yeast infection of the vagina     Past Surgical History:  Procedure Laterality Date  . NO PAST SURGERIES      Family History  Problem Relation Age of Onset  . Hypertension Mother   . Diabetes Mother   . Asthma Father     Social History  Substance Use Topics  . Smoking status: Never Smoker  . Smokeless tobacco: Never Used  . Alcohol use No    Allergies:  Allergies  Allergen Reactions  . Tape     [facility] Tape    Prescriptions Prior to Admission  Medication Sig Dispense Refill Last Dose  . acetaminophen (TYLENOL) 325 MG tablet Take 650 mg by mouth every 6 (six) hours as needed for moderate pain.   Past Month at Unknown time  . albuterol (PROVENTIL HFA;VENTOLIN HFA) 108 (90 BASE) MCG/ACT inhaler Inhale 2 puffs into the lungs every 6 (six) hours as needed for wheezing or shortness of breath.   Past Month at Unknown time  . butalbital-acetaminophen-caffeine (FIORICET) 50-325-40 MG tablet Take 2 tablets by mouth every 6 (six) hours as needed  for headache. 40 tablet 2 Past Month at Unknown time  . Prenatal Vit-Fe Phos-FA-Omega (VITAFOL GUMMIES) 3.33-0.333-34.8 MG CHEW Chew 3 tablets by mouth daily. 90 tablet 11 01/16/2016 at Unknown time    Review of Systems  Constitutional: Negative.   Gastrointestinal: Positive for abdominal pain. Negative for constipation, diarrhea, nausea and vomiting.  Genitourinary: Negative.   Musculoskeletal: Negative.    Physical Exam   Blood pressure (!) 96/50, pulse 94, temperature 98.8 F (37.1 C), temperature source Oral, resp. rate 18, last menstrual period 07/02/2015.  Physical Exam  Nursing note and vitals reviewed. Constitutional: She is oriented to person, place, and time. She appears well-developed and well-nourished. No distress.  HENT:  Head: Normocephalic and atraumatic.  Eyes: Conjunctivae are normal. Right eye exhibits no discharge. Left eye exhibits no discharge. No scleral icterus.  Neck: Normal range of motion.  Cardiovascular: Normal rate, regular rhythm and normal heart sounds.   No murmur heard. Respiratory: Effort normal and breath sounds normal. No respiratory distress. She has no wheezes.  GI: Soft. There is no tenderness.  No contractions palpated  Neurological: She is alert and oriented to person, place, and time.  Skin: Skin is warm and dry. She is not diaphoretic.  Psychiatric: She has a normal mood and affect.  Her behavior is normal. Judgment and thought content normal.   Dilation: Closed (2cm outrer os closed inner) Exam by:: E.Hale Chalfin, NP  Fetal Tracing:  Baseline: 145 Variability: moderate Accelerations: none Decelerations: none  Toco: none   MAU Course  Procedures Results for orders placed or performed during the hospital encounter of 01/16/16 (from the past 48 hour(s))  Urinalysis, Routine w reflex microscopic (not at [facility])     Status: Abnormal   Collection Time: 01/16/16  4:30 PM  Result Value Ref Range   Color, Urine YELLOW YELLOW   APPearance  CLEAR CLEAR   Specific Gravity, Urine 1.015 1.005 - 1.030   pH 6.0 5.0 - 8.0   Glucose, UA 250 (A) NEGATIVE mg/dL   Hgb urine dipstick NEGATIVE NEGATIVE   Bilirubin Urine NEGATIVE NEGATIVE   Ketones, ur NEGATIVE NEGATIVE mg/dL   Protein, ur NEGATIVE NEGATIVE mg/dL   Nitrite NEGATIVE NEGATIVE   Leukocytes, UA NEGATIVE NEGATIVE    Comment: MICROSCOPIC NOT DONE ON URINES WITH NEGATIVE PROTEIN, BLOOD, LEUKOCYTES, NITRITE, OR GLUCOSE <1000 mg/dL.  Fetal fibronectin     Status: None   Collection Time: 01/16/16  5:40 PM  Result Value Ref Range   Fetal Fibronectin NEGATIVE NEGATIVE    MDM Fetal tracing appropriate for gestation No contractions on TOCO; monitor adjusted several times Cervix externally 1-2 cm, internally closed, thick -- ultrasound ordered to check cervical length & FFN collected FFN negative Ultrasound -- cervical length 3.3 cm  Assessment and Plan  A: 1. Pain of round ligament affecting pregnancy, antepartum   2. [redacted] weeks gestation of pregnancy   3. Abdominal pain affecting pregnancy     P: Discharge home Start wearing maternity support belt Increase water intake Discussed reasons to return to MAU Preterm labor precautions Keep f/u with Dr. Inge Rise 01/16/2016, 5:31 PM

## 2016-01-16 NOTE — MAU Note (Signed)
Pt stated she stared having ctx a few weeks ago. Mentioned it to her doctor today and he told her to come in and get checked out.

## 2016-01-16 NOTE — MAU Provider Note (Signed)
History     CSN: [id number]  Arrival date and time: 01/16/16 1613  Provider to bedside at 1735   Chief Complaint  Patient presents with  . Contractions   HPI  Patient is a 20 y.o. [id number] @[redacted]w[redacted]d  presenting to the MAU after her doctors office sent her to be evaluated for contractions and pain. She has been having irregular diffuse abdominal pain and her stomach tightens up like contractions for one week. In last 24 hours they have developed pattern and gotten worse. At office were 5 minutes apart. Patient went home and laid down before coming up here. Says the contractions are an 8/10. She endorses clear, thin non-irritating vaginal discharge. Denies leaking or gushes of fluid, vaginal bleeding, itching, dysuria, or increased urinary frequency.   Also complaining of leg pain that she describes as if she is having lower abdominal pain and  bilateral leg pain associated with shooting leg pains [place] Spaniel she is in 9/10 pain on her legs.She says that drinking fluids calms down the ctx and walking makes it worse. She endorses clear, thin non-irritating vaginal discharge. Denies leaking or gushes of fluid, vaginal bleeding, itching, dysuria, or increased urinary frequency.   She had one prior baby in 2014 at [redacted]w[redacted]d and another baby at 38w.   Past Medical History:  Diagnosis Date  . Asthma   . Post partum depression   . Yeast infection of the vagina     Past Surgical History:  Procedure Laterality Date  . NO PAST SURGERIES      Family History  Problem Relation Age of Onset  . Hypertension Mother   . Diabetes Mother   . Asthma Father     Social History  Substance Use Topics  . Smoking status: Never Smoker  . Smokeless tobacco: Never Used  . Alcohol use No    Allergies:  Allergies  Allergen Reactions  . Tape     Clear Hospital Tape    Prescriptions Prior to Admission  Medication Sig Dispense Refill Last Dose  . acetaminophen (TYLENOL) 325 MG tablet Take 650 mg by mouth every  6 (six) hours as needed for moderate pain.   Past Month at Unknown time  . albuterol (PROVENTIL HFA;VENTOLIN HFA) 108 (90 BASE) MCG/ACT inhaler Inhale 2 puffs into the lungs every 6 (six) hours as needed for wheezing or shortness of breath.   Past Month at Unknown time  . butalbital-acetaminophen-caffeine (FIORICET) 50-325-40 MG tablet Take 2 tablets by mouth every 6 (six) hours as needed for headache. 40 tablet 2 Past Month at Unknown time  . Prenatal Vit-Fe Phos-FA-Omega (VITAFOL GUMMIES) 3.33-0.333-34.8 MG CHEW Chew 3 tablets by mouth daily. 90 tablet 11 01/16/2016 at Unknown time    Review of Systems  Constitutional: Negative.   HENT: Negative.   Eyes: Negative.   Respiratory: Negative.   Cardiovascular: Negative.   Gastrointestinal: Positive for abdominal pain.  Genitourinary: Negative.   Musculoskeletal: Positive for myalgias (bilaterally).  Skin: Negative.    Physical Exam   Blood pressure (!) 109/54, pulse 92, temperature 98.8 F (37.1 C), temperature source Oral, resp. rate 18, last menstrual period 07/02/2015.  Physical Exam  Constitutional: She is oriented to person, place, and time. She appears well-developed and well-nourished.  Eyes: Pupils are equal, round, and reactive to light.  Cardiovascular: Normal rate, regular rhythm and normal heart sounds.   Respiratory: Effort normal and breath sounds normal.  GI: Soft. There is tenderness.  Genitourinary: Cervix exhibits no friability.  Genitourinary Comments: Cervix (exam performed  by preceptor) External: dilated 2 cm, internal os still closed; tender to palpation  Neurological: She is alert and oriented to person, place, and time.  Skin: Skin is warm.    MAU Course  Procedures None  Results for orders placed or performed during the hospital encounter of 01/16/16 (from the past 24 hour(s))  Urinalysis, Routine w reflex microscopic (not at [facility])     Status: Abnormal   Collection Time: 01/16/16  4:30 PM  Result Value  Ref Range   Color, Urine YELLOW YELLOW   APPearance CLEAR CLEAR   Specific Gravity, Urine 1.015 1.005 - 1.030   pH 6.0 5.0 - 8.0   Glucose, UA 250 (A) NEGATIVE mg/dL   Hgb urine dipstick NEGATIVE NEGATIVE   Bilirubin Urine NEGATIVE NEGATIVE   Ketones, ur NEGATIVE NEGATIVE mg/dL   Protein, ur NEGATIVE NEGATIVE mg/dL   Nitrite NEGATIVE NEGATIVE   Leukocytes, UA NEGATIVE NEGATIVE  Fetal fibronectin     Status: None   Collection Time: 01/16/16  5:40 PM  Result Value Ref Range   Fetal Fibronectin NEGATIVE NEGATIVE   Fetal Heart Monitor Category 1;  145 bpm, variability of 6-25 bpm, accelerations present, no decelerations,  No uterine contractions noted     MDM Rule out pre-term labor with negative feto fibronection test and closed internal cervix.  Toco does not show uterine contractions. Transvaginal ultrasound showed long cervix.  Bilateral leg pain describes round ligament pain.  Patient stable upon discharge.   Assessment and Plan  20 y.o. [id number]  presenting to the MAU with abdominal pain consistent with false labor and bilateral leg pain consistent with round ligament pain.   Patient advised to stay hydrated, eat well, get a maternity support belt, treat pain with Tylenol, and to elevate feet when possible.  Patient may experience minimally spotting for the next 24-48 hours from her cervical check This is not worrisome, and patient has been educated about this.   Patient to return to the MAU if contractions occur that are 6 or greater an hour, she experiences bleeding that is like a menstrual period, she has a gush or leakage of fluid, or if she experiences extreme abdominal pain.   Clyde Canterbury 01/16/2016, 7:27 PM

## 2016-01-16 NOTE — Discharge Instructions (Signed)
Round Ligament Pain The round ligament is a cord of muscle and tissue that helps to support the uterus. It can become a source of pain during pregnancy if it becomes stretched or twisted as the baby grows. The pain usually begins in the second trimester of pregnancy, and it can come and go until the baby is delivered. It is not a serious problem, and it does not cause harm to the baby. Round ligament pain is usually a short, sharp, and pinching pain, but it can also be a dull, lingering, and aching pain. The pain is felt in the lower side of the abdomen or in the groin. It usually starts deep in the groin and moves up to the outside of the hip area. Pain can occur with:  A sudden change in position.  Rolling over in bed.  Coughing or sneezing.  Physical activity. HOME CARE INSTRUCTIONS Watch your condition for any changes. Take these steps to help with your pain:  When the pain starts, relax. Then try:  Sitting down.  Flexing your knees up to your abdomen.  Lying on your side with one pillow under your abdomen and another pillow between your legs.  Sitting in a warm bath for 15-20 minutes or until the pain goes away.  Take over-the-counter and prescription medicines only as told by your health care provider.  Move slowly when you sit and stand.  Avoid long walks if they cause pain.  Stop or lessen your physical activities if they cause pain. SEEK MEDICAL CARE IF:  Your pain does not go away with treatment.  You feel pain in your back that you did not have before.  Your medicine is not helping. SEEK IMMEDIATE MEDICAL CARE IF:  You develop a fever or chills.  You develop uterine contractions.  You develop vaginal bleeding.  You develop nausea or vomiting.  You develop diarrhea.  You have pain when you urinate.   This information is not intended to replace advice given to you by your health care provider. Make sure you discuss any questions you have with your health  care provider.   Document Released: 03/17/2008 Document Revised: 08/31/2011 Document Reviewed: 08/15/2014 Elsevier Interactive Patient Education 2016 [employer] Preterm Labor Information Preterm labor is when labor starts before you are [redacted] weeks pregnant. The normal length of pregnancy is 39 to 41 weeks.  CAUSES  The cause of preterm labor is not often known. The most common known cause is infection. RISK FACTORS  Having a history of preterm labor.  Having your water break before it should.  Having a placenta that covers the opening of the cervix.  Having a placenta that breaks away from the uterus.  Having a cervix that is too weak to hold the baby in the uterus.  Having too much fluid in the amniotic sac.  Taking drugs or smoking while pregnant.  Not gaining enough weight while pregnant.  Being younger than 6718 and older than 20 years old.  Having a low income.  Being African American. SYMPTOMS  Period-like cramps, belly (abdominal) pain, or back pain.  Contractions that are regular, as often as six in an hour. They may be mild or painful.  Contractions that start at the top of the belly. They then move to the lower belly and back.  Lower belly pressure that seems to get stronger.  Bleeding from the vagina.  Fluid leaking from the vagina. TREATMENT  Treatment depends on:  Your condition.  The condition of your  baby.  How many weeks pregnant you are. Your doctor may have you:  Take medicine to stop contractions.  Stay in bed except to use the restroom (bed rest).  Stay in the hospital. WHAT SHOULD YOU DO IF YOU THINK YOU ARE IN PRETERM LABOR? Call your doctor right away. You need to go to the hospital right away.  HOW CAN YOU PREVENT PRETERM LABOR IN FUTURE PREGNANCIES?  Stop smoking, if you smoke.  Maintain healthy weight gain.  Do not take drugs or be around chemicals that are not needed.  Tell your doctor if you think you have an  infection.  Tell your doctor if you had a preterm labor before.   This information is not intended to replace advice given to you by your health care provider. Make sure you discuss any questions you have with your health care provider.   Document Released: 09/04/2008 Document Revised: 10/23/2014 Document Reviewed: 07/11/2012 Elsevier Interactive Patient Education [employer]

## 2016-01-16 NOTE — Progress Notes (Signed)
Patient states she is doing better at work- part time. She states she feels contractions and is feeling them today. NST/TOCO monitoring

## 2016-02-05 ENCOUNTER — Ambulatory Visit (INDEPENDENT_AMBULATORY_CARE_PROVIDER_SITE_OTHER): Payer: Medicaid Other | Admitting: Certified Nurse Midwife

## 2016-02-05 VITALS — BP 106/62 | HR 84 | Wt 125.0 lb

## 2016-02-05 DIAGNOSIS — O9989 Other specified diseases and conditions complicating pregnancy, childbirth and the puerperium: Secondary | ICD-10-CM

## 2016-02-05 DIAGNOSIS — O479 False labor, unspecified: Secondary | ICD-10-CM

## 2016-02-05 DIAGNOSIS — Z3480 Encounter for supervision of other normal pregnancy, unspecified trimester: Secondary | ICD-10-CM | POA: Insufficient documentation

## 2016-02-05 DIAGNOSIS — Z3482 Encounter for supervision of other normal pregnancy, second trimester: Secondary | ICD-10-CM

## 2016-02-05 DIAGNOSIS — R102 Pelvic and perineal pain: Secondary | ICD-10-CM

## 2016-02-05 DIAGNOSIS — O26899 Other specified pregnancy related conditions, unspecified trimester: Secondary | ICD-10-CM

## 2016-02-05 DIAGNOSIS — B9689 Other specified bacterial agents as the cause of diseases classified elsewhere: Secondary | ICD-10-CM

## 2016-02-05 DIAGNOSIS — O26842 Uterine size-date discrepancy, second trimester: Secondary | ICD-10-CM

## 2016-02-05 DIAGNOSIS — N760 Acute vaginitis: Secondary | ICD-10-CM

## 2016-02-05 DIAGNOSIS — A499 Bacterial infection, unspecified: Secondary | ICD-10-CM

## 2016-02-05 DIAGNOSIS — Z3483 Encounter for supervision of other normal pregnancy, third trimester: Secondary | ICD-10-CM

## 2016-02-05 MED ORDER — METRONIDAZOLE 500 MG PO TABS: 500.0000 mg | ORAL_TABLET | Freq: Two times a day (BID) | ORAL | 0 refills | Status: DC

## 2016-02-05 MED ORDER — FLUCONAZOLE 100 MG PO TABS: 100.0000 mg | ORAL_TABLET | Freq: Once | ORAL | 0 refills | Status: AC

## 2016-02-05 MED ORDER — TERCONAZOLE 0.8 % VA CREA: 1.0000 | TOPICAL_CREAM | Freq: Every day | VAGINAL | 0 refills | Status: DC

## 2016-02-05 NOTE — Assessment & Plan Note (Signed)
  [facility] Prenatal Labs  Dating  US @5wks  Blood type: B/Positive/-- (04/04 1639)   Genetic Screen 1 Screen:    AFP:     Quad:     NIPS: Antibody:Negative (04/04 1639)  Anatomic US   Rubella: 6.32 (04/04 1639)  GTT Early:               Third trimester:  RPR: Non Reactive (04/04 1639)   Flu vaccine  HBsAg: Negative (04/04 1639)   TDaP vaccine                                               Rhogam: HIV: Non Reactive (04/04 1639)   Baby Food             breast                                  GBS: (For PCN allergy, check sensitivities)  Contraception  Nexplanon Pap: N/A  Circumcision  N/A: female fetus   Pediatrician  [facility]   Support Person

## 2016-02-05 NOTE — Progress Notes (Signed)
Subjective:    Judy [name] is a 20 y.o. female being seen today for her obstetrical visit. She is at 2238w5d gestation. Patient reports no bleeding, no leaking, occasional contractions, vaginal irritation and pelvic pain, round ligament pain. Fetal movement: normal.  Problem List Items Addressed This Visit    None    Visit Diagnoses    Encounter for supervision of other normal pregnancy in second trimester    -  Primary   Relevant Orders   POCT urinalysis dipstick     Patient Active Problem List   Diagnosis Date Noted  . Urinary tract infection, site not specified 12/14/2012  . Excessive or frequent menstruation 11/09/2012  . Dysmenorrhea 11/01/2012  . Vaginitis and vulvovaginitis, unspecified 11/01/2012  . Depressive disorder, not elsewhere classified 10/03/2012  . Subacute pyelonephritis 09/22/2012  . Normal delivery 09/12/2011  . Active labor 09/12/2011   Objective:    BP 106/62   Pulse 84   Wt 125 lb (56.7 kg)   LMP 07/02/2015 (Approximate)   BMI 22.86 kg/m  FHT:  145 BPM  Uterine Size: 24 cm and size less than dates  Presentation: cephalic    NST: + accels, no decels, moderate variability, Cat. 1 tracing. 1 contraction on toco.   Assessment:    Pregnancy @ 6738w5d weeks   round ligament pain  braxton hicks contractions: 1 contraction on toco  S<D  reactive NST  Vaginitis   Plan:    2 hour OGTT scheduled for next Thursday when patient is off of work   RX: Mother to be abdominal support belt   labs reviewed, problem list updated Consent signed. GBS planning TDAP offered  Rhogam given for RH negative Pediatrician: discussed. Infant feeding: plans to breastfeed. Maternity leave: discussed. Cigarette smoking: never smoked. Orders Placed This Encounter  Procedures  . POCT urinalysis dipstick   No orders of the defined types were placed in this encounter.  Follow up in 2 Weeks.

## 2016-02-12 ENCOUNTER — Other Ambulatory Visit: Payer: Self-pay | Admitting: Certified Nurse Midwife

## 2016-02-18 ENCOUNTER — Ambulatory Visit (INDEPENDENT_AMBULATORY_CARE_PROVIDER_SITE_OTHER): Payer: Medicaid Other

## 2016-02-18 ENCOUNTER — Other Ambulatory Visit: Payer: Medicaid Other

## 2016-02-18 ENCOUNTER — Ambulatory Visit (INDEPENDENT_AMBULATORY_CARE_PROVIDER_SITE_OTHER): Payer: Medicaid Other | Admitting: Certified Nurse Midwife

## 2016-02-18 VITALS — BP 108/66 | HR 85 | Wt 128.0 lb

## 2016-02-18 DIAGNOSIS — Z36 Encounter for antenatal screening of mother: Secondary | ICD-10-CM | POA: Diagnosis not present

## 2016-02-18 DIAGNOSIS — Z3483 Encounter for supervision of other normal pregnancy, third trimester: Secondary | ICD-10-CM

## 2016-02-18 DIAGNOSIS — Z3482 Encounter for supervision of other normal pregnancy, second trimester: Secondary | ICD-10-CM

## 2016-02-18 LAB — POCT URINALYSIS DIPSTICK
Bilirubin, UA: NEGATIVE
Glucose, UA: NEGATIVE
KETONES UA: NEGATIVE
Leukocytes, UA: NEGATIVE
Nitrite, UA: NEGATIVE
PROTEIN UA: NEGATIVE
RBC UA: NEGATIVE
UROBILINOGEN UA: NEGATIVE
pH, UA: 6

## 2016-02-18 NOTE — Progress Notes (Signed)
Subjective:    Judy [name] is a 20 y.o. female being seen today for her obstetrical visit. She is at 256w4d gestation. Patient reports no complaints. Fetal movement: normal.  Problem List Items Addressed This Visit    None    Visit Diagnoses    Encounter for supervision of other normal pregnancy in third trimester    -  Primary   Relevant Orders   POCT urinalysis dipstick (Completed)     Patient Active Problem List   Diagnosis Date Noted  . Supervision of other normal pregnancy, antepartum 02/05/2016  . Urinary tract infection, site not specified 12/14/2012  . Excessive or frequent menstruation 11/09/2012  . Dysmenorrhea 11/01/2012  . Vaginitis and vulvovaginitis, unspecified 11/01/2012  . Depressive disorder, not elsewhere classified 10/03/2012  . Subacute pyelonephritis 09/22/2012  . Normal delivery 09/12/2011  . Active labor 09/12/2011   Objective:    BP 108/66   Pulse 85   Wt 128 lb (58.1 kg)   LMP 07/02/2015 (Approximate)   BMI 23.41 kg/m  FHT:  135 BPM  Uterine Size: 29 cm and size equals dates  Presentation: cephalic     Assessment:    Pregnancy @ 6156w4d weeks    Plan:    2 hour OGTT today   labs reviewed, problem list updated Consent signed. GBS planning TDAP offered  Rhogam given for RH negative Pediatrician: discussed. Infant feeding: plans to breastfeed. Maternity leave: discussed. Cigarette smoking: never smoked. Orders Placed This Encounter  Procedures  . POCT urinalysis dipstick   No orders of the defined types were placed in this encounter.  Follow up in 2 Weeks.

## 2016-02-18 NOTE — Addendum Note (Signed)
Addended by: Marya LandryFOSTER, Jamaiya Tunnell D on: 02/18/2016 12:05 PM   Modules accepted: Orders

## 2016-02-18 NOTE — Assessment & Plan Note (Signed)
  Clinic  Prenatal Labs  Dating  Blood type: B/Positive/-- (04/04 1639)   Genetic Screen 1 Screen:    AFP:     Quad:     NIPS: Antibody:Negative (04/04 1639)  Anatomic US  Normal @15  wks; female fetus Rubella: 6.32 (04/04 1639)  GTT Early:               Third trimester:  RPR: Non Reactive (04/04 1639)   Flu vaccine  HBsAg: Negative (04/04 1639)   TDaP vaccine                                               Rhogam: N/A HIV: Non Reactive (04/04 1639)   Baby Food                   Breast                            GBS: (For PCN allergy, check sensitivities)  Contraception  Nexplanon Pap:  Circumcision  N/A   Pediatrician  [facility]   Support Person  [facility]

## 2016-02-19 LAB — CBC
Hematocrit: 31.7 % — ABNORMAL LOW (ref 34.0–46.6)
Hemoglobin: 10.1 g/dL — ABNORMAL LOW (ref 11.1–15.9)
MCH: 23.9 pg — AB (ref 26.6–33.0)
MCHC: 31.9 g/dL (ref 31.5–35.7)
MCV: 75 fL — ABNORMAL LOW (ref 79–97)
PLATELETS: 203 10*3/uL (ref 150–379)
RBC: 4.23 x10E6/uL (ref 3.77–5.28)
RDW: 12.8 % (ref 12.3–15.4)
WBC: 7.8 10*3/uL (ref 3.4–10.8)

## 2016-02-19 LAB — GLUCOSE TOLERANCE, 2 HOURS W/ 1HR
GLUCOSE, FASTING: 81 mg/dL (ref 65–91)
Glucose, 1 hour: 109 mg/dL (ref 65–179)
Glucose, 2 hour: 101 mg/dL (ref 65–152)

## 2016-02-19 LAB — HIV ANTIBODY (ROUTINE TESTING W REFLEX): HIV Screen 4th Generation wRfx: NONREACTIVE

## 2016-02-19 LAB — RPR: RPR: NONREACTIVE

## 2016-02-24 ENCOUNTER — Telehealth (HOSPITAL_BASED_OUTPATIENT_CLINIC_OR_DEPARTMENT_OTHER): Payer: Self-pay | Admitting: *Deleted

## 2016-02-24 NOTE — Telephone Encounter (Signed)
Post ED Visit - Positive Culture Follow-up: Unsuccessful Patient Follow-up  Culture assessed and recommendations reviewed by: []  Enzo BiNathan Batchelder, Pharm.D. []  Celedonio MiyamotoJeremy Frens, Pharm.D., BCPS []  Garvin FilaMike Maccia, Pharm.D. []  Georgina PillionElizabeth Martin, [address]Ph[address]D., BCPS []  ScrantonMinh Pham, [address]Pharm[address]D., BCPS, AAHIVP []  Estella HuskMichelle Turner, Pharm.D., BCPS, AAHIVP []  Tennis Mustassie Stewart, Pharm.D. []  Sherle Poeob Vincent, [address]  Positive urine culture  [x]  Patient discharged without antimicrobial prescription and treatment is now indicated []  Organism is resistant to prescribed ED discharge antimicrobial []  Patient with positive blood cultures   Unable to contact patient after 3 attempts, letter sent to address on file with no response.  No further treatment received.  Virl AxeRobertson, [facility] [name] [facility] 02/24/2016, 10:36 AM

## 2016-02-25 ENCOUNTER — Other Ambulatory Visit: Payer: Self-pay | Admitting: Certified Nurse Midwife

## 2016-02-25 DIAGNOSIS — O99013 Anemia complicating pregnancy, third trimester: Secondary | ICD-10-CM

## 2016-02-25 MED ORDER — NIFEREX PO TABS
1.0000 | ORAL_TABLET | Freq: Two times a day (BID) | ORAL | 5 refills | Status: DC
Start: 2016-02-25 — End: 2016-02-29

## 2016-02-26 ENCOUNTER — Other Ambulatory Visit: Payer: Self-pay | Admitting: Certified Nurse Midwife

## 2016-02-26 DIAGNOSIS — Z3483 Encounter for supervision of other normal pregnancy, third trimester: Secondary | ICD-10-CM

## 2016-02-29 ENCOUNTER — Inpatient Hospital Stay (HOSPITAL_COMMUNITY)
Admission: AD | Admit: 2016-02-29 | Discharge: 2016-02-29 | Disposition: A | Discharge status: Home / Self Care | Payer: Medicaid Other | Source: Ambulatory Visit | Attending: Obstetrics and Gynecology | Admitting: Obstetrics and Gynecology

## 2016-02-29 ENCOUNTER — Encounter (HOSPITAL_COMMUNITY): Payer: Self-pay | Admitting: Certified Nurse Midwife

## 2016-02-29 DIAGNOSIS — D649 Anemia, unspecified: Secondary | ICD-10-CM | POA: Diagnosis not present

## 2016-02-29 DIAGNOSIS — Z8249 Family history of ischemic heart disease and other diseases of the circulatory system: Secondary | ICD-10-CM | POA: Insufficient documentation

## 2016-02-29 DIAGNOSIS — Z3A31 31 weeks gestation of pregnancy: Secondary | ICD-10-CM | POA: Insufficient documentation

## 2016-02-29 DIAGNOSIS — O368130 Decreased fetal movements, third trimester, not applicable or unspecified: Secondary | ICD-10-CM | POA: Diagnosis not present

## 2016-02-29 DIAGNOSIS — O26893 Other specified pregnancy related conditions, third trimester: Secondary | ICD-10-CM

## 2016-02-29 DIAGNOSIS — O99013 Anemia complicating pregnancy, third trimester: Secondary | ICD-10-CM | POA: Insufficient documentation

## 2016-02-29 DIAGNOSIS — Z833 Family history of diabetes mellitus: Secondary | ICD-10-CM | POA: Diagnosis not present

## 2016-02-29 DIAGNOSIS — N898 Other specified noninflammatory disorders of vagina: Secondary | ICD-10-CM

## 2016-02-29 DIAGNOSIS — Z9109 Other allergy status, other than to drugs and biological substances: Secondary | ICD-10-CM | POA: Diagnosis not present

## 2016-02-29 LAB — WET PREP, GENITAL
CLUE CELLS WET PREP: NONE SEEN
SPERM: NONE SEEN
TRICH WET PREP: NONE SEEN
Yeast Wet Prep HPF POC: NONE SEEN

## 2016-02-29 LAB — URINALYSIS, ROUTINE W REFLEX MICROSCOPIC
BILIRUBIN URINE: NEGATIVE
GLUCOSE, UA: NEGATIVE mg/dL
HGB URINE DIPSTICK: NEGATIVE
Ketones, ur: NEGATIVE mg/dL
Nitrite: NEGATIVE
PH: 6.5 (ref 5.0–8.0)
Protein, ur: NEGATIVE mg/dL

## 2016-02-29 LAB — URINE MICROSCOPIC-ADD ON

## 2016-02-29 NOTE — Discharge Instructions (Signed)

## 2016-02-29 NOTE — MAU Note (Signed)
Pt states she had a large gush of fluid at 4AM and some smaller gushes since then. Pt states it has slowed down this afternoon. Pt has not felt baby move since last night. Pt states she is having irregular ctxs. Pt denies vaginal bleeding.

## 2016-02-29 NOTE — MAU Provider Note (Signed)
History     CSN: [id number]  Arrival date and time: 02/29/16 1537   First Provider Initiated Contact with Patient 02/29/16 1620      Chief Complaint  Patient presents with  . Contractions  . Rupture of Membranes  . Decreased Fetal Movement  . Abdominal Pain   HPI  Judy [name] is a 20 year old G3 P1 at 31 weeks and 1 day who presents with concerns of pelvic pressure and leakage of fluids. Ports his all started about 4 AM. She reports fetal movement, but it may be slightly less than usual. She denies any vaginal bleeding. She is also concerned about some shaking spell she has. Usually only lasts a few minutes and is minimal but occasionally has lasted an hour. She said if she goes to release down up with some covers on she gets better. She has been found to be mildly anemic. She has no other concerns no complaints of shortness of breath. No chest pain  OB History    Gravida Para Term Preterm AB Living   3 2 1 1  0 2   SAB TAB Ectopic Multiple Live Births   0 0 0 0 2      Past Medical History:  Diagnosis Date  . Asthma   . Post partum depression   . Yeast infection of the vagina     Past Surgical History:  Procedure Laterality Date  . NO PAST SURGERIES      Family History  Problem Relation Age of Onset  . Hypertension Mother   . Diabetes Mother   . Asthma Father     Social History  Substance Use Topics  . Smoking status: Never Smoker  . Smokeless tobacco: Never Used  . Alcohol use No    Allergies:  Allergies  Allergen Reactions  . Tape Rash    Prescriptions Prior to Admission  Medication Sig Dispense Refill Last Dose  . acetaminophen (TYLENOL) 325 MG tablet Take 650 mg by mouth every 6 (six) hours as needed for mild pain, moderate pain or headache.    Past Month at Unknown time  . albuterol (PROVENTIL HFA;VENTOLIN HFA) 108 (90 BASE) MCG/ACT inhaler Inhale 2 puffs into the lungs every 6 (six) hours as needed for wheezing or shortness of breath.   Past Month at  Unknown time  . butalbital-acetaminophen-caffeine (FIORICET) 50-325-40 MG tablet Take 2 tablets by mouth every 6 (six) hours as needed for headache. 40 tablet 2 Past Week at Unknown time  . Prenatal Vit-Fe Phos-FA-Omega (VITAFOL GUMMIES) 3.33-0.333-34.8 MG CHEW Chew 3 each by mouth daily.   02/28/2016 at Unknown time  . Iron Combinations (NIFEREX) TABS Take 1 tablet by mouth 2 (two) times daily. (Patient not taking: Reported on 02/29/2016) 60 tablet 5   . metroNIDAZOLE (FLAGYL) 500 MG tablet Take 1 tablet (500 mg total) by mouth 2 (two) times daily. (Patient not taking: Reported on 02/29/2016) 14 tablet 0   . terconazole (TERAZOL 3) 0.8 % vaginal cream Place 1 applicator vaginally at bedtime. (Patient not taking: Reported on 02/29/2016) 20 g 0     Review of Systems  Constitutional: Negative for chills and fever.  HENT: Negative for congestion.   Eyes: Negative for blurred vision and double vision.  Respiratory: Negative for cough and shortness of breath.   Cardiovascular: Negative for chest pain and palpitations.  Gastrointestinal: Negative for abdominal pain, heartburn, nausea and vomiting.  Genitourinary: Negative for dysuria and urgency.  Skin: Negative for itching and rash.  Neurological: Negative for dizziness,  tingling and headaches.  Endo/Heme/Allergies: Negative for environmental allergies. Does not bruise/bleed easily.   Physical Exam   Blood pressure 118/55, pulse 93, temperature 98.3 F (36.8 C), temperature source Oral, resp. rate 18, last menstrual period 07/02/2015.  Physical Exam  Constitutional: She is oriented to person, place, and time. She appears well-developed and well-nourished.  HENT:  Head: Normocephalic and atraumatic.  Cardiovascular: Normal rate and intact distal pulses.   GI: Soft. Bowel sounds are normal. She exhibits no distension. There is no tenderness.  Gravid  Genitourinary:  Genitourinary Comments: Cervix closed, large ectropion, copious amount of thin  white discharge. No pooling.  Musculoskeletal: Normal range of motion. She exhibits no edema or deformity.  Neurological: She is alert and oriented to person, place, and time.  Skin: Skin is warm and dry. She is not diaphoretic. No erythema.  Psychiatric: She has a normal mood and affect. Her behavior is normal.    MAU Course  Procedures  MDM In the MAU patient was placed on a continuous fetal monitoring and and was reactive with 15 x 15's. There were no regular contractions noted on the monitor. She underwent pelvic examination which revealed no pooling. There was a copious amount of white thin discharge. Gonorrhea chlamydia as well as wet prep was sent.  Assessment and Plan  Judy [name] 02/29/2016, 4:21 PM   #1: Physiologic discharge patient negative wet prep. Gonorrhea and chlamydia sent but do not suspect will come back positive. #2: Decreased fetal movement patient with reactive NST in MAU. Reassured patient #3: Pelvic pressure. Patient with no signs of UTI but with positive leukoesterase will send urine culture. Cervix closed no contractions on the monitor.

## 2016-03-01 LAB — CULTURE, OB URINE: CULTURE: NO GROWTH

## 2016-03-02 LAB — GC/CHLAMYDIA PROBE AMP (~~LOC~~) NOT AT ARMC
CHLAMYDIA, DNA PROBE: NEGATIVE
NEISSERIA GONORRHEA: NEGATIVE

## 2016-03-03 ENCOUNTER — Ambulatory Visit (INDEPENDENT_AMBULATORY_CARE_PROVIDER_SITE_OTHER): Payer: Medicaid Other | Admitting: Certified Nurse Midwife

## 2016-03-03 VITALS — BP 115/65 | HR 93 | Temp 98.6°F | Wt 130.2 lb

## 2016-03-03 DIAGNOSIS — Z3483 Encounter for supervision of other normal pregnancy, third trimester: Secondary | ICD-10-CM | POA: Diagnosis not present

## 2016-03-03 NOTE — Patient Instructions (Addendum)
Heartburn During Pregnancy Heartburn is a burning sensation in the chest caused by stomach acid backing up into the esophagus. Heartburn is common in pregnancy because a certain hormone (progesterone) is released when a woman is pregnant. The progesterone hormone may relax the valve that separates the esophagus from the stomach. This allows acid to go up into the esophagus, causing heartburn. Heartburn may also happen in pregnancy because the enlarging uterus pushes up on the stomach, which pushes more acid into the esophagus. This is especially true in the later stages of pregnancy. Heartburn problems usually go away after giving birth. CAUSES  Heartburn is caused by stomach acid backing up into the esophagus. During pregnancy, this may result from various things, including:   The progesterone hormone.  Changing hormone levels.  The growing uterus pushing stomach acid upward.  Large meals.  Certain foods and drinks.  Exercise.  Increased acid production. SIGNS AND SYMPTOMS   Burning pain in the chest or lower throat.  Bitter taste in the mouth.  Coughing. DIAGNOSIS  Your health care provider will typically diagnose heartburn by taking a careful history of your concern. Blood tests may be done to check for a certain type of bacteria that is associated with heartburn. Sometimes, heartburn is diagnosed by prescribing a heartburn medicine to see if the symptoms improve. In some cases, a procedure called an endoscopy may be done. In this procedure, a tube with a light and a camera on the end (endoscope) is used to examine the esophagus and the stomach. TREATMENT  Treatment will vary depending on the severity of your symptoms. Your health care provider may recommend:  Over-the-counter medicines (antacids, acid reducers) for mild heartburn.  Prescription medicines to decrease stomach acid or to protect your stomach lining.  Certain changes in your diet.  Elevating the head of your bed  by putting blocks under the legs. This helps prevent stomach acid from backing up into the esophagus when you are lying down. HOME CARE INSTRUCTIONS   Only take over-the-counter or prescription medicines as directed by your health care provider.  Raise the head of your bed by putting blocks under the legs if instructed to do so by your health care provider. Sleeping with more pillows is not effective because it only changes the position of your head.  Do not exercise right after eating.  Avoid eating 2-3 hours before bed. Do not lie down right after eating.  Eat small meals throughout the day instead of three large meals.  Identify foods and beverages that make your symptoms worse and avoid them. Foods you may want to avoid include:  Peppers.  Chocolate.  High-fat foods, including fried foods.  Spicy foods.  Garlic and onions.  Citrus fruits, including oranges, grapefruit, lemons, and limes.  Food containing tomatoes or tomato products.  Mint.  Carbonated and caffeinated drinks.  Vinegar. SEEK MEDICAL CARE IF:  You have abdominal pain of any kind.  You feel burning in your upper abdomen or chest, especially after eating or lying down.  You have nausea and vomiting.  Your stomach feels upset after you eat. SEEK IMMEDIATE MEDICAL CARE IF:   You have severe chest pain that goes down your arm or into your jaw or neck.  You feel sweaty, dizzy, or light-headed.  You become short of breath.  You vomit blood.  You have difficulty or pain with swallowing.  You have bloody or black, tarry stools.  You have episodes of heartburn more than 3 times a week,   for more than 2 weeks. MAKE SURE YOU:  Understand these instructions.  Will watch your condition.  Will get help right away if you are not doing well or get worse.   This information is not intended to replace advice given to you by your health care provider. Make sure you discuss any questions you have with  your health care provider.   Document Released: 06/05/2000 Document Revised: 06/29/2014 Document Reviewed: 01/25/2013 Elsevier Interactive Patient Education [employer] Third Trimester of Pregnancy The third trimester is from week 29 through week 42, months 7 through 9. The third trimester is a time when the fetus is growing rapidly. At the end of the ninth month, the fetus is about 20 inches in length and weighs 6-10 pounds.  BODY CHANGES Your body goes through many changes during pregnancy. The changes vary from woman to woman.   Your weight will continue to increase. You can expect to gain 25-35 pounds (11-16 kg) by the end of the pregnancy.  You may begin to get stretch marks on your hips, abdomen, and breasts.  You may urinate more often because the fetus is moving lower into your pelvis and pressing on your bladder.  You may develop or continue to have heartburn as a result of your pregnancy.  You may develop constipation because certain hormones are causing the muscles that push waste through your intestines to slow down.  You may develop hemorrhoids or swollen, bulging veins (varicose veins).  You may have pelvic pain because of the weight gain and pregnancy hormones relaxing your joints between the bones in your pelvis. Backaches may result from overexertion of the muscles supporting your posture.  You may have changes in your hair. These can include thickening of your hair, rapid growth, and changes in texture. Some women also have hair loss during or after pregnancy, or hair that feels dry or thin. Your hair will most likely return to normal after your baby is born.  Your breasts will continue to grow and be tender. A yellow discharge may leak from your breasts called colostrum.  Your belly button may stick out.  You may feel short of breath because of your expanding uterus.  You may notice the fetus "dropping," or moving lower in your abdomen.  You may have a  bloody mucus discharge. This usually occurs a few days to a week before labor begins.  Your cervix becomes thin and soft (effaced) near your due date. WHAT TO EXPECT AT YOUR PRENATAL EXAMS  You will have prenatal exams every 2 weeks until week 36. Then, you will have weekly prenatal exams. During a routine prenatal visit:  You will be weighed to make sure you and the fetus are growing normally.  Your blood pressure is taken.  Your abdomen will be measured to track your baby's growth.  The fetal heartbeat will be listened to.  Any test results from the previous visit will be discussed.  You may have a cervical check near your due date to see if you have effaced. At around 36 weeks, your caregiver will check your cervix. At the same time, your caregiver will also perform a test on the secretions of the vaginal tissue. This test is to determine if a type of bacteria, Group B streptococcus, is present. Your caregiver will explain this further. Your caregiver may ask you:  What your birth plan is.  How you are feeling.  If you are feeling the baby move.  If you have had  any abnormal symptoms, such as leaking fluid, bleeding, severe headaches, or abdominal cramping.  If you are using any tobacco products, including cigarettes, chewing tobacco, and electronic cigarettes.  If you have any questions. Other tests or screenings that may be performed during your third trimester include:  Blood tests that check for low iron levels (anemia).  Fetal testing to check the health, activity level, and growth of the fetus. Testing is done if you have certain medical conditions or if there are problems during the pregnancy.  HIV (human immunodeficiency virus) testing. If you are at high risk, you may be screened for HIV during your third trimester of pregnancy. FALSE LABOR You may feel small, irregular contractions that eventually go away. These are called Braxton Hicks contractions, or false  labor. Contractions may last for hours, days, or even weeks before true labor sets in. If contractions come at regular intervals, intensify, or become painful, it is best to be seen by your caregiver.  SIGNS OF LABOR   Menstrual-like cramps.  Contractions that are 5 minutes apart or less.  Contractions that start on the top of the uterus and spread down to the lower abdomen and back.  A sense of increased pelvic pressure or back pain.  A watery or bloody mucus discharge that comes from the vagina. If you have any of these signs before the 37th week of pregnancy, call your caregiver right away. You need to go to the hospital to get checked immediately. HOME CARE INSTRUCTIONS   Avoid all smoking, herbs, alcohol, and unprescribed drugs. These chemicals affect the formation and growth of the baby.  Do not use any tobacco products, including cigarettes, chewing tobacco, and electronic cigarettes. If you need help quitting, ask your health care provider. You may receive counseling support and other resources to help you quit.  Follow your caregiver's instructions regarding medicine use. There are medicines that are either safe or unsafe to take during pregnancy.  Exercise only as directed by your caregiver. Experiencing uterine cramps is a good sign to stop exercising.  Continue to eat regular, healthy meals.  Wear a good support bra for breast tenderness.  Do not use hot tubs, steam rooms, or saunas.  Wear your seat belt at all times when driving.  Avoid raw meat, uncooked cheese, cat litter boxes, and soil used by cats. These carry germs that can cause birth defects in the baby.  Take your prenatal vitamins.  Take 1500-2000 mg of calcium daily starting at the 20th week of pregnancy until you deliver your baby.  Try taking a stool softener (if your caregiver approves) if you develop constipation. Eat more high-fiber foods, such as fresh vegetables or fruit and whole grains. Drink  plenty of fluids to keep your urine clear or pale yellow.  Take warm sitz baths to soothe any pain or discomfort caused by hemorrhoids. Use hemorrhoid cream if your caregiver approves.  If you develop varicose veins, wear support hose. Elevate your feet for 15 minutes, 3-4 times a day. Limit salt in your diet.  Avoid heavy lifting, wear low heal shoes, and practice good posture.  Rest a lot with your legs elevated if you have leg cramps or low back pain.  Visit your dentist if you have not gone during your pregnancy. Use a soft toothbrush to brush your teeth and be gentle when you floss.  A sexual relationship may be continued unless your caregiver directs you otherwise.  Do not travel far distances unless it is absolutely  necessary and only with the approval of your caregiver.  Take prenatal classes to understand, practice, and ask questions about the labor and delivery.  Make a trial run to the hospital.  Pack your hospital bag.  Prepare the baby's nursery.  Continue to go to all your prenatal visits as directed by your caregiver. SEEK MEDICAL CARE IF:  You are unsure if you are in labor or if your water has broken.  You have dizziness.  You have mild pelvic cramps, pelvic pressure, or nagging pain in your abdominal area.  You have persistent nausea, vomiting, or diarrhea.  You have a bad smelling vaginal discharge.  You have pain with urination. SEEK IMMEDIATE MEDICAL CARE IF:   You have a fever.  You are leaking fluid from your vagina.  You have spotting or bleeding from your vagina.  You have severe abdominal cramping or pain.  You have rapid weight loss or gain.  You have shortness of breath with chest pain.  You notice sudden or extreme swelling of your face, hands, ankles, feet, or legs.  You have not felt your baby move in over an hour.  You have severe headaches that do not go away with medicine.  You have vision changes.   This information is not  intended to replace advice given to you by your health care provider. Make sure you discuss any questions you have with your health care provider.   Document Released: 06/02/2001 Document Revised: 06/29/2014 Document Reviewed: 08/09/2012 Elsevier Interactive Patient Education 2016 [employer] Preterm Labor Information Preterm labor is when labor starts at less than 37 weeks of pregnancy. The normal length of a pregnancy is 39 to 41 weeks. CAUSES Often, there is no identifiable underlying cause as to why a woman goes into preterm labor. One of the most common known causes of preterm labor is infection. Infections of the uterus, cervix, vagina, amniotic sac, bladder, kidney, or even the lungs (pneumonia) can cause labor to start. Other suspected causes of preterm labor include:   Urogenital infections, such as yeast infections and bacterial vaginosis.   Uterine abnormalities (uterine shape, uterine septum, fibroids, or bleeding from the placenta).   A cervix that has been operated on (it may fail to stay closed).   Malformations in the fetus.   Multiple gestations (twins, triplets, and so on).   Breakage of the amniotic sac.  RISK FACTORS  Having a previous history of preterm labor.   Having premature rupture of membranes (PROM).   Having a placenta that covers the opening of the cervix (placenta previa).   Having a placenta that separates from the uterus (placental abruption).   Having a cervix that is too weak to hold the fetus in the uterus (incompetent cervix).   Having too much fluid in the amniotic sac (polyhydramnios).   Taking illegal drugs or smoking while pregnant.   Not gaining enough weight while pregnant.   Being younger than 2218 and older than 20 years old.   Having a low socioeconomic status.   Being African American. SYMPTOMS Signs and symptoms of preterm labor include:   Menstrual-like cramps, abdominal pain, or back pain.  Uterine  contractions that are regular, as frequent as six in an hour, regardless of their intensity (may be mild or painful).  Contractions that start on the top of the uterus and spread down to the lower abdomen and back.   A sense of increased pelvic pressure.   A watery or bloody mucus discharge that comes  from the vagina.  TREATMENT Depending on the length of the pregnancy and other circumstances, your health care provider may suggest bed rest. If necessary, there are medicines that can be given to stop contractions and to mature the fetal lungs. If labor happens before 34 weeks of pregnancy, a prolonged hospital stay may be recommended. Treatment depends on the condition of both you and the fetus.  WHAT SHOULD YOU DO IF YOU THINK YOU ARE IN PRETERM LABOR? Call your health care provider right away. You will need to go to the hospital to get checked immediately. HOW CAN YOU PREVENT PRETERM LABOR IN FUTURE PREGNANCIES? You should:   Stop smoking if you smoke.  Maintain healthy weight gain and avoid chemicals and drugs that are not necessary.  Be watchful for any type of infection.  Inform your health care provider if you have a known history of preterm labor.   This information is not intended to replace advice given to you by your health care provider. Make sure you discuss any questions you have with your health care provider.   Document Released: 08/29/2003 Document Revised: 02/08/2013 Document Reviewed: 07/11/2012 Elsevier Interactive Patient Education [employer]

## 2016-03-03 NOTE — Progress Notes (Signed)
Subjective:    Judy [name] is a 20 y.o. female being seen today for her obstetrical visit. She is at 2752w4d gestation. Patient reports no complaints. Fetal movement: normal.  Was seen at [facility] on 9/9 for contractions, decreased fetal movement and suspected ROM.  All were ruled out, +NST, closed cervix, negative cultures.    Problem List Items Addressed This Visit    None    Visit Diagnoses    Encounter for supervision of other normal pregnancy in third trimester    -  Primary     Patient Active Problem List   Diagnosis Date Noted  . Supervision of other normal pregnancy, antepartum 02/05/2016  . Urinary tract infection, site not specified 12/14/2012  . Excessive or frequent menstruation 11/09/2012  . Dysmenorrhea 11/01/2012  . Vaginitis and vulvovaginitis, unspecified 11/01/2012  . Depressive disorder, not elsewhere classified 10/03/2012  . Subacute pyelonephritis 09/22/2012  . Normal delivery 09/12/2011  . Active labor 09/12/2011   Objective:    BP 115/65   Pulse 93   Temp 98.6 F (37 C)   Wt 130 lb 3.2 oz (59.1 kg)   LMP 07/02/2015 (Approximate)   BMI 23.81 kg/m  FHT:  135 BPM  Uterine Size: 30 cm and size equals dates  Presentation: cephalic     Assessment:    Pregnancy @ 5952w4d weeks   Doing well  Plan:     labs reviewed, problem list updated Consent signed. GBS planning TDAP offered  Rhogam given for RH negative Pediatrician: discussed. Infant feeding: plans to breastfeed. Maternity leave: discussed. Cigarette smoking: never smoked. No orders of the defined types were placed in this encounter.  No orders of the defined types were placed in this encounter.  Follow up in 2 Weeks.

## 2016-03-03 NOTE — Progress Notes (Signed)
[facility]- everything checked out fine. Cervix is closed.

## 2016-03-06 ENCOUNTER — Other Ambulatory Visit: Payer: Medicaid Other

## 2016-03-06 ENCOUNTER — Encounter: Payer: Self-pay | Admitting: *Deleted

## 2016-03-18 ENCOUNTER — Ambulatory Visit: Payer: Medicaid Other

## 2016-03-18 ENCOUNTER — Observation Stay (HOSPITAL_COMMUNITY)
Admission: AD | Admit: 2016-03-18 | Discharge: 2016-03-19 | Disposition: A | Discharge status: Home / Self Care | Payer: Medicaid Other | Source: Ambulatory Visit | Attending: Obstetrics and Gynecology | Admitting: Obstetrics and Gynecology

## 2016-03-18 ENCOUNTER — Encounter (HOSPITAL_COMMUNITY): Payer: Self-pay | Admitting: *Deleted

## 2016-03-18 ENCOUNTER — Inpatient Hospital Stay (HOSPITAL_COMMUNITY): Payer: Medicaid Other

## 2016-03-18 DIAGNOSIS — Z3A33 33 weeks gestation of pregnancy: Secondary | ICD-10-CM | POA: Diagnosis not present

## 2016-03-18 DIAGNOSIS — O4700 False labor before 37 completed weeks of gestation, unspecified trimester: Secondary | ICD-10-CM | POA: Diagnosis present

## 2016-03-18 DIAGNOSIS — W19XXXA Unspecified fall, initial encounter: Secondary | ICD-10-CM | POA: Diagnosis not present

## 2016-03-18 DIAGNOSIS — O479 False labor, unspecified: Secondary | ICD-10-CM | POA: Diagnosis present

## 2016-03-18 DIAGNOSIS — O9A213 Injury, poisoning and certain other consequences of external causes complicating pregnancy, third trimester: Principal | ICD-10-CM | POA: Insufficient documentation

## 2016-03-18 DIAGNOSIS — IMO0002 Reserved for concepts with insufficient information to code with codable children: Secondary | ICD-10-CM

## 2016-03-18 DIAGNOSIS — Y929 Unspecified place or not applicable: Secondary | ICD-10-CM | POA: Diagnosis not present

## 2016-03-18 DIAGNOSIS — Y999 Unspecified external cause status: Secondary | ICD-10-CM | POA: Diagnosis not present

## 2016-03-18 DIAGNOSIS — J45909 Unspecified asthma, uncomplicated: Secondary | ICD-10-CM | POA: Diagnosis not present

## 2016-03-18 DIAGNOSIS — Y939 Activity, unspecified: Secondary | ICD-10-CM | POA: Diagnosis not present

## 2016-03-18 DIAGNOSIS — O4703 False labor before 37 completed weeks of gestation, third trimester: Secondary | ICD-10-CM

## 2016-03-18 DIAGNOSIS — T1490 Injury, unspecified: Secondary | ICD-10-CM | POA: Diagnosis not present

## 2016-03-18 LAB — URINALYSIS, ROUTINE W REFLEX MICROSCOPIC
Bilirubin Urine: NEGATIVE
Glucose, UA: NEGATIVE mg/dL
Hgb urine dipstick: NEGATIVE
KETONES UR: NEGATIVE mg/dL
LEUKOCYTES UA: NEGATIVE
NITRITE: NEGATIVE
PH: 5.5 (ref 5.0–8.0)
Protein, ur: NEGATIVE mg/dL
Specific Gravity, Urine: <1.005 — ABNORMAL LOW (ref 1.005–1.030)

## 2016-03-18 LAB — FETAL FIBRONECTIN: Fetal Fibronectin: NEGATIVE

## 2016-03-18 MED ORDER — LACTATED RINGERS IV SOLN
INTRAVENOUS | Status: DC
  Administered 2016-03-18 – 2016-03-19 (×3): via INTRAVENOUS

## 2016-03-18 MED ORDER — LACTATED RINGERS IV BOLUS (SEPSIS)
1000.0000 mL | Freq: Once | INTRAVENOUS | Status: AC
  Administered 2016-03-18: 1000 mL via INTRAVENOUS

## 2016-03-18 NOTE — [facility] Provider Note (Signed)
History     CSN: [id number]  Arrival date and time: 03/18/16 1845   First Provider Initiated Contact with Patient 03/18/16 1916      Chief Complaint  Patient presents with  . Fall  . Contractions   HPI Judy [name] is a 20 y.o. [id number] at 2215w5d who presents with contractions s/p fall. Incident occurred last night around 9 pm. Pt fell down 3 steps and landed on left side. Reports irregular contractions since then. Rates pain 7/10. Has not treated. Nothing makes pain better or worse. Feels like she's had 5 contractions in the last hour. Denies vaginal bleeding or LOF. Positive fetal movement.  Reports bilateral ankle pain for the last several weeks & thinks this is the reason for her fall.   OB History    Gravida Para Term Preterm AB Living   3 2 1 1  0 2   SAB TAB Ectopic Multiple Live Births   0 0 0 0 2      Past Medical History:  Diagnosis Date  . Asthma   . Post partum depression   . Yeast infection of the vagina     Past Surgical History:  Procedure Laterality Date  . NO PAST SURGERIES      Family History  Problem Relation Age of Onset  . Hypertension Mother   . Diabetes Mother   . Asthma Father     Social History  Substance Use Topics  . Smoking status: Never Smoker  . Smokeless tobacco: Never Used  . Alcohol use No    Allergies:  Allergies  Allergen Reactions  . Tape Rash    Prescriptions Prior to Admission  Medication Sig Dispense Refill Last Dose  . acetaminophen (TYLENOL) 325 MG tablet Take 650 mg by mouth every 6 (six) hours as needed for mild pain, moderate pain or headache.    Taking  . albuterol (PROVENTIL HFA;VENTOLIN HFA) 108 (90 BASE) MCG/ACT inhaler Inhale 2 puffs into the lungs every 6 (six) hours as needed for wheezing or shortness of breath.   Taking  . butalbital-acetaminophen-caffeine (FIORICET) 50-325-40 MG tablet Take 2 tablets by mouth every 6 (six) hours as needed for headache. 40 tablet 2 Taking  . Prenatal Vit-Fe  Phos-FA-Omega (VITAFOL GUMMIES) 3.33-0.333-34.8 MG CHEW Chew 3 each by mouth daily.   Taking    Review of Systems  Constitutional: Negative.   Gastrointestinal: Positive for abdominal pain.  Genitourinary: Negative.   Musculoskeletal: Positive for falls.   Physical Exam   Blood pressure 116/58, pulse 77, temperature 98.5 F (36.9 C), temperature source Oral, resp. rate 18, last menstrual period 07/02/2015.  Physical Exam  Nursing note and vitals reviewed. Constitutional: She is oriented to person, place, and time. She appears well-developed and well-nourished. No distress.  HENT:  Head: Normocephalic and atraumatic.  Eyes: Conjunctivae are normal. Right eye exhibits no discharge. Left eye exhibits no discharge. No scleral icterus.  Neck: Normal range of motion.  Cardiovascular: Normal rate, regular rhythm and normal heart sounds.   No murmur heard. Respiratory: Effort normal and breath sounds normal. No respiratory distress. She has no wheezes.  GI: Soft.  Musculoskeletal:       Right ankle: She exhibits normal range of motion, no swelling and no deformity.       Left ankle: She exhibits normal range of motion, no swelling and no deformity.  Neurological: She is alert and oriented to person, place, and time.  Skin: Skin is warm and dry. She is not diaphoretic.  Psychiatric: She has a normal mood and affect. Her behavior is normal. Judgment and thought content normal.   Dilation: Closed Effacement (%): Thick Cervical Position: Posterior Exam by:: Judeth Horn NP  Fetal Tracing:  Baseline: 125 Variability: moderate Accelerations: 15x15 Decelerations: none  Toco: 2-10 mins MAU Course  Procedures Results for orders placed or performed during the hospital encounter of 03/18/16 (from the past 24 hour(s))  Urinalysis, Routine w reflex microscopic (not at [facility])     Status: Abnormal   Collection Time: 03/18/16  6:50 PM  Result Value Ref Range   Color, Urine STRAW (A) YELLOW    APPearance CLEAR CLEAR   Specific Gravity, Urine <1.005 (L) 1.005 - 1.030   pH 5.5 5.0 - 8.0   Glucose, UA NEGATIVE NEGATIVE mg/dL   Hgb urine dipstick NEGATIVE NEGATIVE   Bilirubin Urine NEGATIVE NEGATIVE   Ketones, ur NEGATIVE NEGATIVE mg/dL   Protein, ur NEGATIVE NEGATIVE mg/dL   Nitrite NEGATIVE NEGATIVE   Leukocytes, UA NEGATIVE NEGATIVE    MDM Initial tracing looked like baseline of 150s with variable decel -- BPP & limited u/s ordered -- no evidence of previa, BPP 8/8, normal AFI -- upon returning from ultrasound, reactive fetal tracing with 120s baseline  Cervix closed IV LR bolus for contractions S/w Dr. Emelda Fear. Will send FFN. Admit patient to observation for ctx s/p fall.   Assessment and Plan  A: Preterm contractions in the third trimester Initial encounter for fall in pregnancy  P: Observation on antenatal FFN pending IV fluids Continuous fetal monitoring    Judeth Horn 03/18/2016, 7:16 PM

## 2016-03-18 NOTE — MAU Note (Signed)
Urine in lab 

## 2016-03-18 NOTE — MAU Note (Signed)
Fell down 3 steps last night. Tried to protect side when landed.  No bleeding or leaking.  Has been contracting off and on for weeks.  After she fell, she started  And today, not as bad now. No bleeding or leaking.

## 2016-03-18 NOTE — MAU Note (Signed)
States she fell last night around 9PM. Fell down 3 steps, fell on her L side. States her ankles have been hurting/aching for about 2 weeks and thinks that is why she fell. States when she sits her ankle pain is "4"/10 and when she stands and walks the ankle pain is "10."

## 2016-03-18 NOTE — Progress Notes (Signed)
Notified Neonatologist Auten and ok regarding obs admission per Raiford NobleErin Lawerence NP/ Dr Emelda FearFerguson.

## 2016-03-19 DIAGNOSIS — Z3A33 33 weeks gestation of pregnancy: Secondary | ICD-10-CM

## 2016-03-19 DIAGNOSIS — O4700 False labor before 37 completed weeks of gestation, unspecified trimester: Secondary | ICD-10-CM | POA: Diagnosis present

## 2016-03-19 DIAGNOSIS — O479 False labor, unspecified: Secondary | ICD-10-CM | POA: Diagnosis present

## 2016-03-19 DIAGNOSIS — O9A213 Injury, poisoning and certain other consequences of external causes complicating pregnancy, third trimester: Secondary | ICD-10-CM

## 2016-03-19 DIAGNOSIS — J45909 Unspecified asthma, uncomplicated: Secondary | ICD-10-CM | POA: Diagnosis not present

## 2016-03-19 LAB — ABO/RH: ABO/RH(D): B POS

## 2016-03-19 LAB — TYPE AND SCREEN
ABO/RH(D): B POS
ANTIBODY SCREEN: NEGATIVE

## 2016-03-19 MED ORDER — DOCUSATE SODIUM 100 MG PO CAPS
100.0000 mg | ORAL_CAPSULE | Freq: Every day | ORAL | Status: DC
  Administered 2016-03-19: 100 mg via ORAL
  Filled 2016-03-19: qty 1

## 2016-03-19 MED ORDER — ZOLPIDEM TARTRATE 5 MG PO TABS: 5.0000 mg | ORAL_TABLET | Freq: Every evening | ORAL | Status: DC | PRN

## 2016-03-19 MED ORDER — ACETAMINOPHEN 325 MG PO TABS: 650.0000 mg | ORAL_TABLET | ORAL | Status: DC | PRN

## 2016-03-19 MED ORDER — GI COCKTAIL ~~LOC~~
30.0000 mL | Freq: Once | ORAL | Status: AC
  Administered 2016-03-19: 30 mL via ORAL
  Filled 2016-03-19: qty 30

## 2016-03-19 MED ORDER — CALCIUM CARBONATE ANTACID 500 MG PO CHEW
2.0000 | CHEWABLE_TABLET | ORAL | Status: DC | PRN
  Administered 2016-03-19: 400 mg via ORAL
  Filled 2016-03-19: qty 2

## 2016-03-19 MED ORDER — PRENATAL MULTIVITAMIN CH
1.0000 | ORAL_TABLET | Freq: Every day | ORAL | Status: DC
  Administered 2016-03-19: 1 via ORAL
  Filled 2016-03-19: qty 1

## 2016-03-19 NOTE — Discharge Instructions (Signed)
Fetal Movement Counts °Patient Name: __________________________________________________ Patient Due Date: ____________________ °Performing a fetal movement count is highly recommended in high-risk pregnancies, but it is good for every pregnant woman to do. Your health care provider may ask you to start counting fetal movements at 28 weeks of the pregnancy. Fetal movements often increase: °· After eating a full meal. °· After physical activity. °· After eating or drinking something sweet or cold. °· At rest. °Pay attention to when you feel the baby is most active. This will help you notice a pattern of your baby's sleep and wake cycles and what factors contribute to an increase in fetal movement. It is important to perform a fetal movement count at the same time each day when your baby is normally most active.  °HOW TO COUNT FETAL MOVEMENTS °1. Find a quiet and comfortable area to sit or lie down on your left side. Lying on your left side provides the best blood and oxygen circulation to your baby. °2. Write down the day and time on a sheet of paper or in a journal. °3. Start counting kicks, flutters, swishes, rolls, or jabs in a 2-hour period. You should feel at least 10 movements within 2 hours. °4. If you do not feel 10 movements in 2 hours, wait 2-3 hours and count again. Look for a change in the pattern or not enough counts in 2 hours. °SEEK MEDICAL CARE IF: °· You feel less than 10 counts in 2 hours, tried twice. °· There is no movement in over an hour. °· The pattern is changing or taking longer each day to reach 10 counts in 2 hours. °· You feel the baby is not moving as he or she usually does. °Date: ____________ Movements: ____________ Start time: ____________ Finish time: ____________  °Date: ____________ Movements: ____________ Start time: ____________ Finish time: ____________ °Date: ____________ Movements: ____________ Start time: ____________ Finish time: ____________ °Date: ____________ Movements:  ____________ Start time: ____________ Finish time: ____________ °Date: ____________ Movements: ____________ Start time: ____________ Finish time: ____________ °Date: ____________ Movements: ____________ Start time: ____________ Finish time: ____________ °Date: ____________ Movements: ____________ Start time: ____________ Finish time: ____________ °Date: ____________ Movements: ____________ Start time: ____________ Finish time: ____________  °Date: ____________ Movements: ____________ Start time: ____________ Finish time: ____________ °Date: ____________ Movements: ____________ Start time: ____________ Finish time: ____________ °Date: ____________ Movements: ____________ Start time: ____________ Finish time: ____________ °Date: ____________ Movements: ____________ Start time: ____________ Finish time: ____________ °Date: ____________ Movements: ____________ Start time: ____________ Finish time: ____________ °Date: ____________ Movements: ____________ Start time: ____________ Finish time: ____________ °Date: ____________ Movements: ____________ Start time: ____________ Finish time: ____________  °Date: ____________ Movements: ____________ Start time: ____________ Finish time: ____________ °Date: ____________ Movements: ____________ Start time: ____________ Finish time: ____________ °Date: ____________ Movements: ____________ Start time: ____________ Finish time: ____________ °Date: ____________ Movements: ____________ Start time: ____________ Finish time: ____________ °Date: ____________ Movements: ____________ Start time: ____________ Finish time: ____________ °Date: ____________ Movements: ____________ Start time: ____________ Finish time: ____________ °Date: ____________ Movements: ____________ Start time: ____________ Finish time: ____________  °Date: ____________ Movements: ____________ Start time: ____________ Finish time: ____________ °Date: ____________ Movements: ____________ Start time: ____________ Finish  time: ____________ °Date: ____________ Movements: ____________ Start time: ____________ Finish time: ____________ °Date: ____________ Movements: ____________ Start time: ____________ Finish time: ____________ °Date: ____________ Movements: ____________ Start time: ____________ Finish time: ____________ °Date: ____________ Movements: ____________ Start time: ____________ Finish time: ____________ °Date: ____________ Movements: ____________ Start time: ____________ Finish time: ____________  °Date: ____________ Movements: ____________ Start time: ____________ Finish   time: ____________ °Date: ____________ Movements: ____________ Start time: ____________ Finish time: ____________ °Date: ____________ Movements: ____________ Start time: ____________ Finish time: ____________ °Date: ____________ Movements: ____________ Start time: ____________ Finish time: ____________ °Date: ____________ Movements: ____________ Start time: ____________ Finish time: ____________ °Date: ____________ Movements: ____________ Start time: ____________ Finish time: ____________ °Date: ____________ Movements: ____________ Start time: ____________ Finish time: ____________  °Date: ____________ Movements: ____________ Start time: ____________ Finish time: ____________ °Date: ____________ Movements: ____________ Start time: ____________ Finish time: ____________ °Date: ____________ Movements: ____________ Start time: ____________ Finish time: ____________ °Date: ____________ Movements: ____________ Start time: ____________ Finish time: ____________ °Date: ____________ Movements: ____________ Start time: ____________ Finish time: ____________ °Date: ____________ Movements: ____________ Start time: ____________ Finish time: ____________ °Date: ____________ Movements: ____________ Start time: ____________ Finish time: ____________  °Date: ____________ Movements: ____________ Start time: ____________ Finish time: ____________ °Date: ____________  Movements: ____________ Start time: ____________ Finish time: ____________ °Date: ____________ Movements: ____________ Start time: ____________ Finish time: ____________ °Date: ____________ Movements: ____________ Start time: ____________ Finish time: ____________ °Date: ____________ Movements: ____________ Start time: ____________ Finish time: ____________ °Date: ____________ Movements: ____________ Start time: ____________ Finish time: ____________ °Date: ____________ Movements: ____________ Start time: ____________ Finish time: ____________  °Date: ____________ Movements: ____________ Start time: ____________ Finish time: ____________ °Date: ____________ Movements: ____________ Start time: ____________ Finish time: ____________ °Date: ____________ Movements: ____________ Start time: ____________ Finish time: ____________ °Date: ____________ Movements: ____________ Start time: ____________ Finish time: ____________ °Date: ____________ Movements: ____________ Start time: ____________ Finish time: ____________ °Date: ____________ Movements: ____________ Start time: ____________ Finish time: ____________ °  °This information is not intended to replace advice given to you by your health care provider. Make sure you discuss any questions you have with your health care provider. °  °Document Released: 07/08/2006 Document Revised: 06/29/2014 Document Reviewed: 04/04/2012 °Elsevier Interactive Patient Education ©2016 [employer] °Preterm Labor Information °Preterm labor is when labor starts at less than 37 weeks of pregnancy. The normal length of a pregnancy is 39 to 41 weeks. °CAUSES °Often, there is no identifiable underlying cause as to why a woman goes into preterm labor. One of the most common known causes of preterm labor is infection. Infections of the uterus, cervix, vagina, amniotic sac, bladder, kidney, or even the lungs (pneumonia) can cause labor to start. Other suspected causes of preterm labor include:   °· Urogenital infections, such as yeast infections and bacterial vaginosis.   °· Uterine abnormalities (uterine shape, uterine septum, fibroids, or bleeding from the placenta).   °· A cervix that has been operated on (it may fail to stay closed).   °· Malformations in the fetus.   °· Multiple gestations (twins, triplets, and so on).   °· Breakage of the amniotic sac.   °RISK FACTORS °· Having a previous history of preterm labor.   °· Having premature rupture of membranes (PROM).   °· Having a placenta that covers the opening of the cervix (placenta previa).   °· Having a placenta that separates from the uterus (placental abruption).   °· Having a cervix that is too weak to hold the fetus in the uterus (incompetent cervix).   °· Having too much fluid in the amniotic sac (polyhydramnios).   °· Taking illegal drugs or smoking while pregnant.   °· Not gaining enough weight while pregnant.   °· Being younger than 18 and older than 20 years old.   °· Having a low socioeconomic status.   °· Being African American. °SYMPTOMS °Signs and symptoms of preterm labor include:  °· Menstrual-like cramps, abdominal pain, or back pain. °· Uterine contractions that are regular, as   frequent as six in an hour, regardless of their intensity (may be mild or painful).  Contractions that start on the top of the uterus and spread down to the lower abdomen and back.   A sense of increased pelvic pressure.   A watery or bloody mucus discharge that comes from the vagina.  TREATMENT Depending on the length of the pregnancy and other circumstances, your health care provider may suggest bed rest. If necessary, there are medicines that can be given to stop contractions and to mature the fetal lungs. If labor happens before 34 weeks of pregnancy, a prolonged hospital stay may be recommended. Treatment depends on the condition of both you and the fetus.  WHAT SHOULD YOU DO IF YOU THINK YOU ARE IN PRETERM LABOR? Call your health care  provider right away. You will need to go to the hospital to get checked immediately. HOW CAN YOU PREVENT PRETERM LABOR IN FUTURE PREGNANCIES? You should:   Stop smoking if you smoke.  Maintain healthy weight gain and avoid chemicals and drugs that are not necessary.  Be watchful for any type of infection.  Inform your health care provider if you have a known history of preterm labor.   This information is not intended to replace advice given to you by your health care provider. Make sure you discuss any questions you have with your health care provider.   Document Released: 08/29/2003 Document Revised: 02/08/2013 Document Reviewed: 07/11/2012 Elsevier Interactive Patient Education [employer]2016 [employer] Inc[employer]

## 2016-03-19 NOTE — Discharge Summary (Signed)
Antenatal Physician Discharge Summary  Patient ID: Judy [name] Denise MRN: [id number] DOB/AGE: 01/16/1996 20 y.o.  Admit date: 03/18/2016 Discharge date: 03/19/2016  Admission Diagnoses:Preterm contractions. S/p fall, pregnancy 33 w 5 d  Discharge Diagnoses: Preterm contractions minimal, negative FFN, pregnancy 33w 6 not delivered  Antenatal Unit Procedures: ultrasound and contuous monitoring overnight  Intrapartum Procedures: none  Significant Diagnostic Studies:  Results for orders placed or performed during the hospital encounter of 03/18/16 (from the past 168 hour(s))  Urinalysis, Routine w reflex microscopic ([facility])   Collection Time: 03/18/16  6:50 PM  Result Value Ref Range   Color, Urine STRAW (A) YELLOW   APPearance CLEAR CLEAR   Specific Gravity, Urine <1.005 (L) 1.005 - 1.030   pH 5.5 5.0 - 8.0   Glucose, UA NEGATIVE NEGATIVE mg/dL   Hgb urine dipstick NEGATIVE NEGATIVE   Bilirubin Urine NEGATIVE NEGATIVE   Ketones, ur NEGATIVE NEGATIVE mg/dL   Protein, ur NEGATIVE NEGATIVE mg/dL   Nitrite NEGATIVE NEGATIVE   Leukocytes, UA NEGATIVE NEGATIVE  Fetal fibronectin   Collection Time: 03/18/16  9:00 PM  Result Value Ref Range   Fetal Fibronectin NEGATIVE NEGATIVE  [facility]   Collection Time: 03/19/16 12:45 AM  Result Value Ref Range   ABO/RH(D) B POS    Antibody Screen NEG    Sample Expiration 03/22/2016   ABO/Rh   Collection Time: 03/19/16 12:45 AM  Result Value Ref Range   ABO/RH(D) B POS     Treatments: IV hydration and EFM oovernite  Hospital Course:  This is a 20 y.o. [id number] with IUP at 138w6d admitted for observation. She was admitted with slight uterine activity, no cervical changes, , noted to have a cervical exam of  LTC.  No leaking of fluid and no bleeding.  She was Negative FFN, She is noted only to have minimal uterine contractions , IREGULAR..  .   She was comfortable thru the minimal contractions  She was deemed stable for discharge to home with [facility]   Discharge Exam: BP 120/68 (BP Location: Left Arm)   Pulse 81   Temp 98.1 F (36.7 C) (Oral)   Resp 20   Ht 5' 2.5" (1.588 [name])   Wt 131 lb (59.4 kg)   LMP 07/02/2015 (Approximate)   SpO2 100%   BMI 23.58 kg/[name]  General appearance: alert, appears stated age and no distress Head: Normocephalic, without obvious abnormality, atraumatic GI: soft, non-tender; bowel sounds normal; no masses,  no organomegaly and gravid uterus.  Discharge Condition: Stable  Disposition: 01-Home or Self Care  Discharge Instructions    Call MD for:  severe uncontrolled pain    Complete by:  As directed    Call MD for:  temperature >100.4    Complete by:  As directed    Diet - low sodium heart healthy    Complete by:  As directed    Increase activity slowly    Complete by:  As directed        Medication List    TAKE these medications   acetaminophen 325 MG tablet Commonly known as:  TYLENOL Take 650 mg by mouth every 6 (six) hours as needed for mild pain, moderate pain or headache.   albuterol 108 (90 Base) MCG/ACT inhaler Commonly known as:  PROVENTIL HFA;VENTOLIN HFA Inhale 2 puffs into the lungs every 6 (six) hours as needed for wheezing or shortness of breath.   butalbital-acetaminophen-caffeine  50-325-40 MG tablet Commonly known as:  FIORICET Take 2 tablets by mouth every 6 (six) hours as needed for headache.   VITAFOL GUMMIES 3.33-0.333-34.8 MG Chew Chew 3 each by mouth daily.      Follow-up Information    [facility] Follow up in 2 week(s).   Specialty:  Obstetrics and Gynecology Contact information: [address], [address] [place] [address] [phone]          Signed: Tilda Burrow [name].D. 03/19/2016, 10:19 AM

## 2016-03-26 ENCOUNTER — Other Ambulatory Visit (HOSPITAL_COMMUNITY)
Admission: RE | Admit: 2016-03-26 | Discharge: 2016-03-26 | Disposition: A | Discharge status: Home / Self Care | Payer: Medicaid Other | Source: Ambulatory Visit | Attending: Certified Nurse Midwife | Admitting: Certified Nurse Midwife

## 2016-03-26 ENCOUNTER — Ambulatory Visit (INDEPENDENT_AMBULATORY_CARE_PROVIDER_SITE_OTHER): Payer: Medicaid Other | Admitting: Certified Nurse Midwife

## 2016-03-26 ENCOUNTER — Encounter: Payer: Self-pay | Admitting: *Deleted

## 2016-03-26 VITALS — BP 123/85 | HR 82

## 2016-03-26 DIAGNOSIS — O98813 Other maternal infectious and parasitic diseases complicating pregnancy, third trimester: Secondary | ICD-10-CM

## 2016-03-26 DIAGNOSIS — Z23 Encounter for immunization: Secondary | ICD-10-CM

## 2016-03-26 DIAGNOSIS — Z3480 Encounter for supervision of other normal pregnancy, unspecified trimester: Secondary | ICD-10-CM

## 2016-03-26 DIAGNOSIS — Z3483 Encounter for supervision of other normal pregnancy, third trimester: Secondary | ICD-10-CM | POA: Diagnosis not present

## 2016-03-26 DIAGNOSIS — B373 Candidiasis of vulva and vagina: Secondary | ICD-10-CM | POA: Diagnosis not present

## 2016-03-26 DIAGNOSIS — Z113 Encounter for screening for infections with a predominantly sexual mode of transmission: Secondary | ICD-10-CM | POA: Insufficient documentation

## 2016-03-26 DIAGNOSIS — O9A213 Injury, poisoning and certain other consequences of external causes complicating pregnancy, third trimester: Secondary | ICD-10-CM

## 2016-03-26 DIAGNOSIS — B3731 Acute candidiasis of vulva and vagina: Secondary | ICD-10-CM

## 2016-03-26 MED ORDER — TERCONAZOLE 0.8 % VA CREA: 1.0000 | TOPICAL_CREAM | Freq: Every day | VAGINAL | 0 refills | Status: DC

## 2016-03-26 NOTE — Patient Instructions (Addendum)
Preterm Labor Information Preterm labor is when labor starts at less than 37 weeks of pregnancy. The normal length of a pregnancy is 39 to 41 weeks. CAUSES Often, there is no identifiable underlying cause as to why a woman goes into preterm labor. One of the most common known causes of preterm labor is infection. Infections of the uterus, cervix, vagina, amniotic sac, bladder, kidney, or even the lungs (pneumonia) can cause labor to start. Other suspected causes of preterm labor include:   Urogenital infections, such as yeast infections and bacterial vaginosis.   Uterine abnormalities (uterine shape, uterine septum, fibroids, or bleeding from the placenta).   A cervix that has been operated on (it may fail to stay closed).   Malformations in the fetus.   Multiple gestations (twins, triplets, and so on).   Breakage of the amniotic sac.  RISK FACTORS  Having a previous history of preterm labor.   Having premature rupture of membranes (PROM).   Having a placenta that covers the opening of the cervix (placenta previa).   Having a placenta that separates from the uterus (placental abruption).   Having a cervix that is too weak to hold the fetus in the uterus (incompetent cervix).   Having too much fluid in the amniotic sac (polyhydramnios).   Taking illegal drugs or smoking while pregnant.   Not gaining enough weight while pregnant.   Being younger than 18 and older than 20 years old.   Having a low socioeconomic status.   Being African American. SYMPTOMS Signs and symptoms of preterm labor include:   Menstrual-like cramps, abdominal pain, or back pain.  Uterine contractions that are regular, as frequent as six in an hour, regardless of their intensity (may be mild or painful).  Contractions that start on the top of the uterus and spread down to the lower abdomen and back.   A sense of increased pelvic pressure.   A watery or bloody mucus discharge that  comes from the vagina.  TREATMENT Depending on the length of the pregnancy and other circumstances, your health care provider may suggest bed rest. If necessary, there are medicines that can be given to stop contractions and to mature the fetal lungs. If labor happens before 34 weeks of pregnancy, a prolonged hospital stay may be recommended. Treatment depends on the condition of both you and the fetus.  WHAT SHOULD YOU DO IF YOU THINK YOU ARE IN PRETERM LABOR? Call your health care provider right away. You will need to go to the hospital to get checked immediately. HOW CAN YOU PREVENT PRETERM LABOR IN FUTURE PREGNANCIES? You should:   Stop smoking if you smoke.  Maintain healthy weight gain and avoid chemicals and drugs that are not necessary.  Be watchful for any type of infection.  Inform your health care provider if you have a known history of preterm labor.   This information is not intended to replace advice given to you by your health care provider. Make sure you discuss any questions you have with your health care provider.   Document Released: 08/29/2003 Document Revised: 02/08/2013 Document Reviewed: 07/11/2012 Elsevier Interactive Patient Education 2016 [employer] Third Trimester of Pregnancy The third trimester is from week 29 through week 42, months 7 through 9. The third trimester is a time when the fetus is growing rapidly. At the end of the ninth month, the fetus is about 20 inches in length and weighs 6-10 pounds.  BODY CHANGES Your body goes through many changes during pregnancy.   The changes vary from woman to woman.   Your weight will continue to increase. You can expect to gain 25-35 pounds (11-16 kg) by the end of the pregnancy.  You may begin to get stretch marks on your hips, abdomen, and breasts.  You may urinate more often because the fetus is moving lower into your pelvis and pressing on your bladder.  You may develop or continue to have heartburn as a  result of your pregnancy.  You may develop constipation because certain hormones are causing the muscles that push waste through your intestines to slow down.  You may develop hemorrhoids or swollen, bulging veins (varicose veins).  You may have pelvic pain because of the weight gain and pregnancy hormones relaxing your joints between the bones in your pelvis. Backaches may result from overexertion of the muscles supporting your posture.  You may have changes in your hair. These can include thickening of your hair, rapid growth, and changes in texture. Some women also have hair loss during or after pregnancy, or hair that feels dry or thin. Your hair will most likely return to normal after your baby is born.  Your breasts will continue to grow and be tender. A yellow discharge may leak from your breasts called colostrum.  Your belly button may stick out.  You may feel short of breath because of your expanding uterus.  You may notice the fetus "dropping," or moving lower in your abdomen.  You may have a bloody mucus discharge. This usually occurs a few days to a week before labor begins.  Your cervix becomes thin and soft (effaced) near your due date. WHAT TO EXPECT AT YOUR PRENATAL EXAMS  You will have prenatal exams every 2 weeks until week 36. Then, you will have weekly prenatal exams. During a routine prenatal visit:  You will be weighed to make sure you and the fetus are growing normally.  Your blood pressure is taken.  Your abdomen will be measured to track your baby's growth.  The fetal heartbeat will be listened to.  Any test results from the previous visit will be discussed.  You may have a cervical check near your due date to see if you have effaced. At around 36 weeks, your caregiver will check your cervix. At the same time, your caregiver will also perform a test on the secretions of the vaginal tissue. This test is to determine if a type of bacteria, Group B  streptococcus, is present. Your caregiver will explain this further. Your caregiver may ask you:  What your birth plan is.  How you are feeling.  If you are feeling the baby move.  If you have had any abnormal symptoms, such as leaking fluid, bleeding, severe headaches, or abdominal cramping.  If you are using any tobacco products, including cigarettes, chewing tobacco, and electronic cigarettes.  If you have any questions. Other tests or screenings that may be performed during your third trimester include:  Blood tests that check for low iron levels (anemia).  Fetal testing to check the health, activity level, and growth of the fetus. Testing is done if you have certain medical conditions or if there are problems during the pregnancy.  HIV (human immunodeficiency virus) testing. If you are at high risk, you may be screened for HIV during your third trimester of pregnancy. FALSE LABOR You may feel small, irregular contractions that eventually go away. These are called Braxton Hicks contractions, or false labor. Contractions may last for hours, days, or even   weeks before true labor sets in. If contractions come at regular intervals, intensify, or become painful, it is best to be seen by your caregiver.  SIGNS OF LABOR   Menstrual-like cramps.  Contractions that are 5 minutes apart or less.  Contractions that start on the top of the uterus and spread down to the lower abdomen and back.  A sense of increased pelvic pressure or back pain.  A watery or bloody mucus discharge that comes from the vagina. If you have any of these signs before the 37th week of pregnancy, call your caregiver right away. You need to go to the hospital to get checked immediately. HOME CARE INSTRUCTIONS   Avoid all smoking, herbs, alcohol, and unprescribed drugs. These chemicals affect the formation and growth of the baby.  Do not use any tobacco products, including cigarettes, chewing tobacco, and  electronic cigarettes. If you need help quitting, ask your health care provider. You may receive counseling support and other resources to help you quit.  Follow your caregiver's instructions regarding medicine use. There are medicines that are either safe or unsafe to take during pregnancy.  Exercise only as directed by your caregiver. Experiencing uterine cramps is a good sign to stop exercising.  Continue to eat regular, healthy meals.  Wear a good support bra for breast tenderness.  Do not use hot tubs, steam rooms, or saunas.  Wear your seat belt at all times when driving.  Avoid raw meat, uncooked cheese, cat litter boxes, and soil used by cats. These carry germs that can cause birth defects in the baby.  Take your prenatal vitamins.  Take 1500-2000 mg of calcium daily starting at the 20th week of pregnancy until you deliver your baby.  Try taking a stool softener (if your caregiver approves) if you develop constipation. Eat more high-fiber foods, such as fresh vegetables or fruit and whole grains. Drink plenty of fluids to keep your urine clear or pale yellow.  Take warm sitz baths to soothe any pain or discomfort caused by hemorrhoids. Use hemorrhoid cream if your caregiver approves.  If you develop varicose veins, wear support hose. Elevate your feet for 15 minutes, 3-4 times a day. Limit salt in your diet.  Avoid heavy lifting, wear low heal shoes, and practice good posture.  Rest a lot with your legs elevated if you have leg cramps or low back pain.  Visit your dentist if you have not gone during your pregnancy. Use a soft toothbrush to brush your teeth and be gentle when you floss.  A sexual relationship may be continued unless your caregiver directs you otherwise.  Do not travel far distances unless it is absolutely necessary and only with the approval of your caregiver.  Take prenatal classes to understand, practice, and ask questions about the labor and  delivery.  Make a trial run to the hospital.  Pack your hospital bag.  Prepare the baby's nursery.  Continue to go to all your prenatal visits as directed by your caregiver. SEEK MEDICAL CARE IF:  You are unsure if you are in labor or if your water has broken.  You have dizziness.  You have mild pelvic cramps, pelvic pressure, or nagging pain in your abdominal area.  You have persistent nausea, vomiting, or diarrhea.  You have a bad smelling vaginal discharge.  You have pain with urination. SEEK IMMEDIATE MEDICAL CARE IF:   You have a fever.  You are leaking fluid from your vagina.  You have spotting or bleeding   from your vagina.  You have severe abdominal cramping or pain.  You have rapid weight loss or gain.  You have shortness of breath with chest pain.  You notice sudden or extreme swelling of your face, hands, ankles, feet, or legs.  You have not felt your baby move in over an hour.  You have severe headaches that do not go away with medicine.  You have vision changes.   This information is not intended to replace advice given to you by your health care provider. Make sure you discuss any questions you have with your health care provider.   Document Released: 06/02/2001 Document Revised: 06/29/2014 Document Reviewed: 08/09/2012 Elsevier Interactive Patient Education 2016 Elsevier Inc.  

## 2016-03-26 NOTE — Progress Notes (Signed)
Subjective:    Vinie Silllisha M Whitelaw is a 20 y.o. female being seen today for her obstetrical visit. She is at 7129w6d gestation. Patient reports no bleeding, no leaking and occasional contractions. Fetal movement: normal.  Problem List Items Addressed This Visit      Other   Supervision of other normal pregnancy, antepartum   Relevant Orders   Strep Gp B NAA   [facility]Chlam[facility] ()[facility]    Other Visit Diagnoses    Traumatic injury during pregnancy in third trimester    -  Primary   Yeast vaginitis       Relevant Medications   terconazole (TERAZOL 3) 0.8 % vaginal cream     Patient Active Problem List   Diagnosis Date Noted  . Preterm contractions 03/19/2016  . Supervision of other normal pregnancy, antepartum 02/05/2016  . Urinary tract infection, site not specified 12/14/2012  . Excessive or frequent menstruation 11/09/2012  . Dysmenorrhea 11/01/2012  . Vaginitis and vulvovaginitis, unspecified 11/01/2012  . Depressive disorder, not elsewhere classified 10/03/2012  . Subacute pyelonephritis 09/22/2012  . Normal delivery 09/12/2011  . Active labor 09/12/2011   Objective:    BP 123/85   Pulse 82   LMP 07/02/2015 (Approximate)  FHT:  144 BPM  Uterine Size: 35 cm and size equals dates  Presentation: cephalic   Cervix: 1cm, ballotable, thick, posterior.   Assessment:    Pregnancy @ 4029w6d weeks   Hx of abdominal trauma in pregnancy  yeast vaginitis  Plan:    Out of work letter today, is contracting while at work   labs reviewed, problem list updated Consent signed. GBS sent TDAP offered  Rhogam given for RH negative Pediatrician: discussed. Infant feeding: plans to breastfeed. Maternity leave: discussed. Cigarette smoking: never smoked. Orders Placed This Encounter  Procedures  . Strep Gp B NAA   Meds ordered this encounter  Medications  . terconazole (TERAZOL 3) 0.8 % vaginal cream    Sig: Place 1 applicator vaginally at bedtime.   Dispense:  20 g    Refill:  0   Follow up in 1 Week.

## 2016-03-26 NOTE — Progress Notes (Signed)
Pt states she was admitted to hospital last week after a fall.  Pt states she was also taken out of work during that time. Pt would like to be taken out of work for maternity leave.

## 2016-03-27 LAB — GC/CHLAMYDIA PROBE AMP (~~LOC~~) NOT AT ARMC
Chlamydia: NEGATIVE
Neisseria Gonorrhea: NEGATIVE

## 2016-03-28 LAB — STREP GP B NAA: STREP GROUP B AG: NEGATIVE

## 2016-04-02 ENCOUNTER — Inpatient Hospital Stay (HOSPITAL_COMMUNITY)
Admission: AD | Admit: 2016-04-02 | Discharge: 2016-04-04 | DRG: 775 | Disposition: A | Discharge status: Home / Self Care | Payer: Medicaid Other | Source: Ambulatory Visit | Attending: Obstetrics & Gynecology | Admitting: Obstetrics & Gynecology

## 2016-04-02 ENCOUNTER — Encounter (HOSPITAL_COMMUNITY): Payer: Self-pay

## 2016-04-02 ENCOUNTER — Inpatient Hospital Stay (HOSPITAL_COMMUNITY): Payer: Medicaid Other | Admitting: Anesthesiology

## 2016-04-02 ENCOUNTER — Encounter: Payer: Medicaid Other | Admitting: Certified Nurse Midwife

## 2016-04-02 DIAGNOSIS — Z3A35 35 weeks gestation of pregnancy: Secondary | ICD-10-CM

## 2016-04-02 DIAGNOSIS — Z833 Family history of diabetes mellitus: Secondary | ICD-10-CM | POA: Diagnosis not present

## 2016-04-02 DIAGNOSIS — J45909 Unspecified asthma, uncomplicated: Secondary | ICD-10-CM | POA: Diagnosis present

## 2016-04-02 DIAGNOSIS — O9962 Diseases of the digestive system complicating childbirth: Secondary | ICD-10-CM | POA: Diagnosis present

## 2016-04-02 DIAGNOSIS — K219 Gastro-esophageal reflux disease without esophagitis: Secondary | ICD-10-CM | POA: Diagnosis present

## 2016-04-02 DIAGNOSIS — O2690 Pregnancy related conditions, unspecified, unspecified trimester: Secondary | ICD-10-CM | POA: Diagnosis not present

## 2016-04-02 DIAGNOSIS — Z8249 Family history of ischemic heart disease and other diseases of the circulatory system: Secondary | ICD-10-CM | POA: Diagnosis not present

## 2016-04-02 DIAGNOSIS — O99513 Diseases of the respiratory system complicating pregnancy, third trimester: Secondary | ICD-10-CM | POA: Diagnosis present

## 2016-04-02 DIAGNOSIS — Z3483 Encounter for supervision of other normal pregnancy, third trimester: Secondary | ICD-10-CM | POA: Diagnosis present

## 2016-04-02 LAB — CBC
HCT: 30.7 % — ABNORMAL LOW (ref 36.0–46.0)
HEMOGLOBIN: 10.1 g/dL — AB (ref 12.0–15.0)
MCH: 23 pg — AB (ref 26.0–34.0)
MCHC: 32.9 g/dL (ref 30.0–36.0)
MCV: 69.9 fL — ABNORMAL LOW (ref 78.0–100.0)
Platelets: 214 10*3/uL (ref 150–400)
RBC: 4.39 MIL/uL (ref 3.87–5.11)
RDW: 14.1 % (ref 11.5–15.5)
WBC: 10.4 10*3/uL (ref 4.0–10.5)

## 2016-04-02 LAB — TYPE AND SCREEN
ABO/RH(D): B POS
Antibody Screen: NEGATIVE

## 2016-04-02 LAB — POCT FERN TEST: POCT FERN TEST: NEGATIVE

## 2016-04-02 LAB — RPR: RPR: NONREACTIVE

## 2016-04-02 MED ORDER — FENTANYL 2.5 MCG/ML BUPIVACAINE 1/10 % EPIDURAL INFUSION (WH - ANES)
14.0000 mL/h | INTRAMUSCULAR | Status: DC | PRN
  Administered 2016-04-02: 14 mL/h via EPIDURAL

## 2016-04-02 MED ORDER — ZOLPIDEM TARTRATE 5 MG PO TABS: 5.0000 mg | ORAL_TABLET | Freq: Every evening | ORAL | Status: DC | PRN

## 2016-04-02 MED ORDER — OXYTOCIN 40 UNITS IN LACTATED RINGERS INFUSION - SIMPLE MED
2.5000 [IU]/h | INTRAVENOUS | Status: DC
  Filled 2016-04-02: qty 1000

## 2016-04-02 MED ORDER — DIBUCAINE 1 % RE OINT: 1.0000 "application " | TOPICAL_OINTMENT | RECTAL | Status: DC | PRN

## 2016-04-02 MED ORDER — EPHEDRINE 5 MG/ML INJ
10.0000 mg | INTRAVENOUS | Status: DC | PRN
  Filled 2016-04-02: qty 4

## 2016-04-02 MED ORDER — DIPHENHYDRAMINE HCL 50 MG/ML IJ SOLN: 12.5000 mg | INTRAMUSCULAR | Status: DC | PRN

## 2016-04-02 MED ORDER — LACTATED RINGERS IV SOLN: 500.0000 mL | Freq: Once | INTRAVENOUS | Status: DC

## 2016-04-02 MED ORDER — WITCH HAZEL-GLYCERIN EX PADS: 1.0000 "application " | MEDICATED_PAD | CUTANEOUS | Status: DC | PRN

## 2016-04-02 MED ORDER — LIDOCAINE HCL (PF) 1 % IJ SOLN
INTRAMUSCULAR | Status: DC | PRN
  Administered 2016-04-02: 4 mL via EPIDURAL
  Administered 2016-04-02: 6 mL via EPIDURAL

## 2016-04-02 MED ORDER — OXYTOCIN BOLUS FROM INFUSION
500.0000 mL | Freq: Once | INTRAVENOUS | Status: AC
  Administered 2016-04-02: 500 mL via INTRAVENOUS

## 2016-04-02 MED ORDER — COCONUT OIL OIL
1.0000 "application " | TOPICAL_OIL | Status: DC | PRN
  Administered 2016-04-02: 1 via TOPICAL
  Filled 2016-04-02: qty 120

## 2016-04-02 MED ORDER — PHENYLEPHRINE 40 MCG/ML (10ML) SYRINGE FOR IV PUSH (FOR BLOOD PRESSURE SUPPORT)
PREFILLED_SYRINGE | INTRAVENOUS | Status: AC
  Filled 2016-04-02: qty 20

## 2016-04-02 MED ORDER — ONDANSETRON HCL 4 MG/2ML IJ SOLN: 4.0000 mg | INTRAMUSCULAR | Status: DC | PRN

## 2016-04-02 MED ORDER — OXYCODONE-ACETAMINOPHEN 5-325 MG PO TABS
1.0000 | ORAL_TABLET | ORAL | Status: DC | PRN
  Administered 2016-04-02: 1 via ORAL
  Filled 2016-04-02: qty 1

## 2016-04-02 MED ORDER — SIMETHICONE 80 MG PO CHEW
80.0000 mg | CHEWABLE_TABLET | ORAL | Status: DC | PRN
Start: 2016-04-02 — End: 2016-04-04

## 2016-04-02 MED ORDER — PHENYLEPHRINE 40 MCG/ML (10ML) SYRINGE FOR IV PUSH (FOR BLOOD PRESSURE SUPPORT)
80.0000 ug | PREFILLED_SYRINGE | INTRAVENOUS | Status: DC | PRN
  Filled 2016-04-02: qty 5

## 2016-04-02 MED ORDER — SOD CITRATE-CITRIC ACID 500-334 MG/5ML PO SOLN: 30.0000 mL | ORAL | Status: DC | PRN

## 2016-04-02 MED ORDER — LIDOCAINE HCL (PF) 1 % IJ SOLN
30.0000 mL | INTRAMUSCULAR | Status: DC | PRN
  Filled 2016-04-02: qty 30

## 2016-04-02 MED ORDER — LACTATED RINGERS IV SOLN: INTRAVENOUS | Status: DC

## 2016-04-02 MED ORDER — OXYCODONE-ACETAMINOPHEN 5-325 MG PO TABS: 2.0000 | ORAL_TABLET | ORAL | Status: DC | PRN

## 2016-04-02 MED ORDER — TETANUS-DIPHTH-ACELL PERTUSSIS 5-2.5-18.5 LF-MCG/0.5 IM SUSP: 0.5000 mL | Freq: Once | INTRAMUSCULAR | Status: DC

## 2016-04-02 MED ORDER — PRENATAL MULTIVITAMIN CH
1.0000 | ORAL_TABLET | Freq: Every day | ORAL | Status: DC
  Administered 2016-04-02 – 2016-04-04 (×3): 1 via ORAL
  Filled 2016-04-02 (×3): qty 1

## 2016-04-02 MED ORDER — IBUPROFEN 600 MG PO TABS
600.0000 mg | ORAL_TABLET | Freq: Four times a day (QID) | ORAL | Status: DC
  Administered 2016-04-02 – 2016-04-04 (×9): 600 mg via ORAL
  Filled 2016-04-02 (×9): qty 1

## 2016-04-02 MED ORDER — FLEET ENEMA 7-19 GM/118ML RE ENEM: 1.0000 | ENEMA | RECTAL | Status: DC | PRN

## 2016-04-02 MED ORDER — LACTATED RINGERS IV BOLUS (SEPSIS)
1000.0000 mL | Freq: Once | INTRAVENOUS | Status: AC
  Administered 2016-04-02: 1000 mL via INTRAVENOUS

## 2016-04-02 MED ORDER — DIPHENHYDRAMINE HCL 25 MG PO CAPS: 25.0000 mg | ORAL_CAPSULE | Freq: Four times a day (QID) | ORAL | Status: DC | PRN

## 2016-04-02 MED ORDER — ACETAMINOPHEN 325 MG PO TABS
650.0000 mg | ORAL_TABLET | ORAL | Status: DC | PRN
Start: 2016-04-02 — End: 2016-04-03

## 2016-04-02 MED ORDER — ONDANSETRON HCL 4 MG/2ML IJ SOLN: 4.0000 mg | Freq: Four times a day (QID) | INTRAMUSCULAR | Status: DC | PRN

## 2016-04-02 MED ORDER — ONDANSETRON HCL 4 MG PO TABS: 4.0000 mg | ORAL_TABLET | ORAL | Status: DC | PRN

## 2016-04-02 MED ORDER — BENZOCAINE-MENTHOL 20-0.5 % EX AERO
1.0000 "application " | INHALATION_SPRAY | CUTANEOUS | Status: DC | PRN
  Administered 2016-04-02: 1 via TOPICAL
  Filled 2016-04-02: qty 56

## 2016-04-02 MED ORDER — SENNOSIDES-DOCUSATE SODIUM 8.6-50 MG PO TABS
2.0000 | ORAL_TABLET | ORAL | Status: DC
  Administered 2016-04-02 – 2016-04-03 (×2): 2 via ORAL
  Filled 2016-04-02 (×2): qty 2

## 2016-04-02 MED ORDER — ACETAMINOPHEN 325 MG PO TABS
650.0000 mg | ORAL_TABLET | ORAL | Status: DC | PRN
  Administered 2016-04-03 – 2016-04-04 (×3): 650 mg via ORAL
  Filled 2016-04-02 (×3): qty 2

## 2016-04-02 MED ORDER — BETAMETHASONE SOD PHOS & ACET 6 (3-3) MG/ML IJ SUSP
12.0000 mg | Freq: Once | INTRAMUSCULAR | Status: AC
  Administered 2016-04-02: 12 mg via INTRAMUSCULAR
  Filled 2016-04-02: qty 2

## 2016-04-02 MED ORDER — LACTATED RINGERS IV SOLN
500.0000 mL | INTRAVENOUS | Status: DC | PRN
Start: 2016-04-02 — End: 2016-04-03

## 2016-04-02 MED ORDER — FENTANYL 2.5 MCG/ML BUPIVACAINE 1/10 % EPIDURAL INFUSION (WH - ANES)
INTRAMUSCULAR | Status: AC
  Filled 2016-04-02: qty 125

## 2016-04-02 NOTE — H&P (Signed)
LABOR AND DELIVERY ADMISSION HISTORY AND PHYSICAL NOTE  Judy [name] is a 20 y.o. female [phone] with IUP at [redacted]w[redacted]d by LMP presenting for SOL. She had strong, regular contractions starting at 0100. She thinks her water may have broken, but she was fern negative x 2 in the MAU and did not have any pooling on speculum exam.  She reports positive fetal movement. She denies vaginal bleeding.  Prenatal History/Complications: Hx post-partum depression.  Past Medical History: Past Medical History:  Diagnosis Date  . Asthma   . Post partum depression   . Yeast infection of the vagina     Past Surgical History: Past Surgical History:  Procedure Laterality Date  . NO PAST SURGERIES      Obstetrical History: OB History    Gravida Para Term Preterm AB Living   3 2 1 1  0 2   SAB TAB Ectopic Multiple Live Births   0 0 0 0 2      Social History: Social History   Social History  . Marital status: Single    Spouse name: N/A  . Number of children: N/A  . Years of education: N/A   Social History Main Topics  . Smoking status: Never Smoker  . Smokeless tobacco: Never Used  . Alcohol use No  . Drug use: No  . Sexual activity: No   Other Topics Concern  . None   Social History Narrative  . None    Family History: Family History  Problem Relation Age of Onset  . Hypertension Mother   . Diabetes Mother   . Asthma Father     Allergies: Allergies  Allergen Reactions  . Tape Rash    Prescriptions Prior to Admission  Medication Sig Dispense Refill Last Dose  . albuterol (PROVENTIL HFA;VENTOLIN HFA) 108 (90 BASE) MCG/ACT inhaler Inhale 2 puffs into the lungs every 6 (six) hours as needed for wheezing or shortness of breath.   Past Week at Unknown time  . Prenatal Vit-Fe Phos-FA-Omega (VITAFOL GUMMIES) 3.33-0.333-34.8 MG CHEW Chew 3 each by mouth daily.   04/01/2016 at Unknown time  . acetaminophen (TYLENOL) 325 MG tablet Take 650 mg by mouth every 6 (six) hours as  needed for mild pain, moderate pain or headache.    Past Month at Unknown time  . butalbital-acetaminophen-caffeine (FIORICET) 50-325-40 MG tablet Take 2 tablets by mouth every 6 (six) hours as needed for headache. 40 tablet 2 Past Week at Unknown time  . terconazole (TERAZOL 3) 0.8 % vaginal cream Place 1 applicator vaginally at bedtime. 20 g 0      Review of Systems   All systems reviewed and negative except as stated in HPI  Last menstrual period 07/02/2015. General appearance: alert, cooperative and moderate distress Lungs: clear to auscultation bilaterally Heart: regular rate and rhythm Abdomen: soft, non-tender; bowel sounds normal Extremities: No calf swelling or tenderness Presentation: cephalic by exam Fetal monitoring: Baseline 130bpm, moderate variability, accelerations present, no decelerations Uterine activity: Moderate contractions every 3-5 minutes Dilation: 5 Effacement (%): 60, 70 Station: -2 Exam by:: Remigio Eisenmenger RN   Prenatal labs: ABO, Rh: --/--/B POS, B POS (09/28 0045) Antibody: NEG (09/28 0045) Rubella: Immune RPR: Non Reactive (08/29 1100)  HBsAg: Negative (04/04 1639)  HIV: Non Reactive (08/29 1100)  GBS: Negative (10/05 1127)  1 hr Glucola: 89/109/101 Genetic screening:  None Anatomy US: Normal  Prenatal Transfer Tool  Maternal Diabetes: No Genetic Screening: Declined Maternal Ultrasounds/Referrals: Normal Fetal Ultrasounds or other Referrals:  None Maternal Substance Abuse:  No Significant Maternal Medications:  None Significant Maternal Lab Results: GBS negative  Results for orders placed or performed during the hospital encounter of 04/02/16 (from the past 24 hour(s))  [facility]   Collection Time: 04/02/16  5:59 AM  Result Value Ref Range   POCT Fern Test Negative = intact amniotic membranes     Patient Active Problem List   Diagnosis Date Noted  . Preterm contractions 03/19/2016  . Supervision of other normal pregnancy,  antepartum 02/05/2016  . Urinary tract infection, site not specified 12/14/2012  . Excessive or frequent menstruation 11/09/2012  . Dysmenorrhea 11/01/2012  . Vaginitis and vulvovaginitis, unspecified 11/01/2012  . Depressive disorder, not elsewhere classified 10/03/2012  . Subacute pyelonephritis 09/22/2012  . Normal delivery 09/12/2011  . Active labor 09/12/2011    Assessment: Judy [name] is a 20 y.o. [id number] at 3646w6d here for SOL.  #Labor: Progressing spontaneously #Pain: Wants epidural #FWB: Category I #ID: GBS negative #MOF: Breast #MOC: Nexplanon #Circ:  N/a- female  Judy [name] 04/02/2016, 6:30 AM   OB FELLOW HISTORY AND PHYSICAL ATTESTATION  I have seen and examined this patient; I agree with above documentation in the resident's note.    Ernestina Pennaicholas Shaketta Rill 04/02/2016, 10:36 AM

## 2016-04-02 NOTE — Anesthesia Procedure Notes (Signed)
Epidural Patient location during procedure: OB Start time: 04/02/2016 7:11 AM End time: 04/02/2016 7:16 AM  Staffing Anesthesiologist: Linton RumpALLAN, Tadarrius Burch DICKERSON Performed: anesthesiologist   Preanesthetic Checklist Completed: patient identified, surgical consent, pre-op evaluation, timeout performed, IV checked, risks and benefits discussed and monitors and equipment checked  Epidural Patient position: sitting Prep: site prepped and draped and DuraPrep Patient monitoring: continuous pulse ox and blood pressure Approach: midline Location: L2-L3 Injection technique: LOR air  Needle:  Needle type: Tuohy  Needle gauge: 17 G Needle length: 9 cm and 9 Needle insertion depth: 4 cm Catheter type: closed end flexible Catheter size: 19 Gauge Catheter at skin depth: 9 cm Test dose: negative  Assessment Events: blood not aspirated, injection not painful, no injection resistance, negative IV test and no paresthesia  Additional Notes Epidural placed easily on first attempt.Reason for block:procedure for pain

## 2016-04-02 NOTE — MAU Note (Signed)
Woke up at 1 am having abd pain. Feeling contractions and pressure.  No bleeding. ? Leaking clear fluid at 0440.  Baby moving well today.

## 2016-04-02 NOTE — Anesthesia Preprocedure Evaluation (Signed)
Anesthesia Evaluation  Patient identified by MRN, date of birth, ID band Patient awake    Reviewed: Allergy & Precautions, NPO status , Patient's Chart, lab work & pertinent test results  History of Anesthesia Complications Negative for: history of anesthetic complications  Airway Mallampati: III  TM Distance: >3 FB Neck ROM: Full    Dental  (+) Teeth Intact   Pulmonary asthma ,    Pulmonary exam normal breath sounds clear to auscultation       Cardiovascular negative cardio ROS   Rhythm:Regular Rate:Normal     Neuro/Psych PSYCHIATRIC DISORDERS Depression negative neurological ROS     GI/Hepatic Neg liver ROS, GERD  ,  Endo/Other  negative endocrine ROS  Renal/GU negative Renal ROS     Musculoskeletal   Abdominal   Peds  Hematology negative hematology ROS (+)   Anesthesia Other Findings   Reproductive/Obstetrics (+) Pregnancy                             Anesthesia Physical Anesthesia Plan  ASA: II  Anesthesia Plan: Epidural   Post-op Pain Management:    Induction:   Airway Management Planned: Natural Airway  Additional Equipment:   Intra-op Plan:   Post-operative Plan:   Informed Consent: I have reviewed the patients History and Physical, chart, labs and discussed the procedure including the risks, benefits and alternatives for the proposed anesthesia with the patient or authorized representative who has indicated his/her understanding and acceptance.     Plan Discussed with:   Anesthesia Plan Comments: (I have discussed risks of neuraxial anesthesia including but not limited to infection, bleeding, nerve injury, back pain, headache, seizures, and failure of block. Patient denies bleeding disorders and is not currently anticoagulated. Labs have been reviewed. Risks and benefits discussed. All patient's questions answered.   Platelets 214)        Anesthesia Quick  Evaluation

## 2016-04-02 NOTE — Anesthesia Postprocedure Evaluation (Signed)
Anesthesia Post Note  Patient: Judy [name]  Procedure(s) Performed: * No procedures listed *  Patient location during evaluation: Mother Baby Anesthesia Type: Epidural Level of consciousness: awake, awake and alert, oriented and patient cooperative Pain management: pain level controlled Vital Signs Assessment: post-procedure vital signs reviewed and stable Respiratory status: spontaneous breathing, nonlabored ventilation and respiratory function stable Cardiovascular status: stable Postop Assessment: patient able to bend at knees, no headache, no backache and no signs of nausea or vomiting Anesthetic complications: no     Last Vitals:  Vitals:   04/02/16 1000 04/02/16 1015  BP: 114/72 (!) 122/59  Pulse: 77 84  Resp: 20   Temp: 36.6 C     Last Pain:  Vitals:   04/02/16 1145  TempSrc:   PainSc: 0-No pain   Pain Goal: Patients Stated Pain Goal: 4 (04/02/16 0456)               [name]

## 2016-04-02 NOTE — Plan of Care (Signed)
Problem: Pain Management: Goal: General experience of comfort will improve and pain level will decrease Patient is experiencing intense uterine cramping with breastfeeding unrelieved by medication. Third baby in 4 years. Will assess and provide comfort measures to ease cramping.

## 2016-04-02 NOTE — Lactation Note (Signed)
This note was copied from a baby's chart. Lactation Consultation Note  Patient Name: Judy [name] ZOXWR'UToday's Date: 04/02/2016 Reason for consult: Initial assessment Baby at 10 hr of life. Mom is reporting bilateral sore nipples, no skin break down noted. RN to give coconut oil. She bf her older children with no issues. She stopped bf after returning to school because she was not able to keep up with pumping.  Mom is not pumping after every feeding because it causes her to have more cramps. She is manually expressing into the baby's mouth after bf. Discussed baby behavior, feeding frequency, baby belly size, voids, wt loss, breast changes, and nipple care. She is aware of LPT infant feeding policy. Given lactation handouts. Aware of OP services and support group. She will offer the breast on demand 8+/24hr, post pump, and supplement per volume guidelines. She will call for lactation to observe the next feeding.     Maternal Data Has patient been taught Hand Expression?: Yes Does the patient have breastfeeding experience prior to this delivery?: Yes  Feeding Feeding Type: Breast Fed Length of feed: 15 min  LATCH Score/Interventions Latch: Grasps breast easily, tongue down, lips flanged, rhythmical sucking.  Audible Swallowing: A few with stimulation  Type of Nipple: Everted at rest and after stimulation  Comfort (Breast/Nipple): Soft / non-tender     Hold (Positioning): No assistance needed to correctly position infant at breast. Intervention(s): Breastfeeding basics reviewed;Support Pillows  LATCH Score: 9  Lactation Tools Discussed/Used WIC Program: Yes   Consult Status Consult Status: Follow-up Date: 04/02/16 Follow-up type: In-patient    Judy [name] 04/02/2016, 7:30 PM

## 2016-04-03 MED ORDER — CYCLOBENZAPRINE HCL 5 MG PO TABS
5.0000 mg | ORAL_TABLET | Freq: Three times a day (TID) | ORAL | Status: DC | PRN
  Administered 2016-04-03: 5 mg via ORAL
  Filled 2016-04-03 (×2): qty 1

## 2016-04-03 NOTE — Clinical Social Work Maternal (Signed)
  CLINICAL SOCIAL WORK MATERNAL/CHILD NOTE  Patient Details  Name: Judy [name] MRN: [id number] Date of Birth: [birth year]  Date:  04/03/2016  Clinical Social Worker Initiating Note:  Ferdinand Lango Dereona Kolodny, MSW, LCSW-A Date/ Time Initiated:  04/03/16/1111     Child's Name:  Judy [name]    Legal Guardian:  Other (Comment) (Not established by court system; [name] is primary provider )   Need for Interpreter:  None   Date of Referral:  04/02/16     Reason for Referral:  Other (Comment) ([name] hx of PPD )   Referral Source:  Physician   Address:  [address] [place], [place] [address]  Phone number:  [phone]   Household Members:  Self, Minor Children, Significant Other   Natural Supports (not living in the home):  Parent, Immediate Family, Friends   Professional Supports: None   Employment: Part-time   Type of Work:     Education:  9 to 11 years   Museum/gallery curator Resources:  Medicaid   Other Resources:  Physicist, medical , [place] Considerations Which May Impact Care: None reported at this time.   Strengths:  Pediatrician chosen , Ability to meet basic needs    Risk Factors/Current Problems:  Other (Comment) ([name] hx of PPD)   Cognitive State:  Alert , Goal Oriented , Insightful    Mood/Affect:  Interested , Comfortable , Calm    CSW Assessment: CSW met with [name] at bedside to complete assessment. This Probation officer explained role and reasoning for visit being due to her hx of PPD. At this time [name] endorsed experiencing PPD after her first pregnancy. She noted her symptoms to be feeling down and sad, not wanting to talk to any one or do anything except play with the baby. [name] further noted she was not eating and experienced a lot of weight loss. This Probation officer discussed pre-cautions do PPD and shared some preventive measures she can take to decrease her risk of experiencing it again. This Probation officer empathized with [name]'s previous experience and reassured her PPD is  normal and nothing to be ashamed of. This writer instructed [name] in what to do if she is having symptoms. [name] verbalized understanding. This Probation officer further discussed SIDS which [name] noted familiarity and was able to tell this Probation officer the "do's" and "don'ts" to prevent SIDS.   [name] verbalizes feeling prepared to go home for the most part; however, needs assistance with a car seat, baby diaper bag and any resources to buy papers for the baby. CSW inquired whether or not [name] had any money to put towards the purchase of a carseat. [name] verbalized having $30.00. CSW informed [name] she would be able to assist with getting a carseat; however, is unsure about the hospital provided a diaper bag. Lastly, this Probation officer explained she would seek-out community resources to help [name] with obtaining pampers for baby. At this time, no other needs were addressed or requested. CSW will continue to assist [name] with obtaining the above mentioned items in addition to linking her with community resources to help with basic needs.    CSW Plan/Description:  Psychosocial Support and Ongoing Assessment of Needs, Information/Referral to [employer], MSW, [place] Worker  [facility]  Office: [phone]

## 2016-04-03 NOTE — Lactation Note (Signed)
This note was copied from a baby's chart. Lactation Consultation Note  Patient Name: Judy [name] ZOXWR'UToday's Date: 04/03/2016 Reason for consult: Follow-up assessment Baby at 32 hr of life. Mom stated that she is not making much milk and her nipples are sore. No skin break was noted. She is using expressed milk and coconut oil after each feeding. She reports putting baby to both breast then f/u with formula bottles. She has not pumped today because she is still having cramps with nipple stimulation. She plans to pump tonight. Discussed baby behavior, feeding frequency, pumping, supplementing, baby belly size, voids, breast changes, and nipple care. She will offer both breast for no more than 10 minutes each, post pump, and supplement with formula per LPT infant guidelines. She will call for lactation to view a latch at the next bf.     Maternal Data    Feeding Feeding Type: Bottle Fed - Formula Nipple Type: Slow - flow  LATCH Score/Interventions                      Lactation Tools Discussed/Used     Consult Status Consult Status: Follow-up Date: 04/04/16 Follow-up type: In-patient    Rulon Eisenmengerlizabeth E Aleysha Meckler 04/03/2016, 6:07 PM

## 2016-04-03 NOTE — Lactation Note (Signed)
This note was copied from a baby's chart. Lactation Consultation Note Mom breast and formula feeding baby encouraged to BF first then supplement if felt needed. Discussed just giving breast for now to increase milk supply and for baby to get more colostrum. Discussed benefits of just BF. Mom has pendulum breast w/everted nipples. Mom holding baby in cross cradle position, latched baby, needing positioning assistance for body alignment. Also using props and being comfortable. Baby latched well. Saw good pulling on breast tissue.  Patient Name: Judy [name] Reason for consult: Follow-up assessment   Maternal Data    Feeding Feeding Type: Breast Fed Length of feed: 10 min  LATCH Score/Interventions Latch: Repeated attempts needed to sustain latch, nipple held in mouth throughout feeding, stimulation needed to elicit sucking reflex. Intervention(s): Adjust position;Breast massage;Assist with latch;Breast compression  Audible Swallowing: A few with stimulation Intervention(s): Hand expression;Skin to skin Intervention(s): Alternate breast massage  Type of Nipple: Everted at rest and after stimulation  Comfort (Breast/Nipple): Filling, red/small blisters or bruises, mild/mod discomfort     Hold (Positioning): Assistance needed to correctly position infant at breast and maintain latch. Intervention(s): Breastfeeding basics reviewed;Support Pillows;Position options;Skin to skin  LATCH Score: 6  Lactation Tools Discussed/Used     Consult Status Consult Status: Follow-up Date: 04/03/16 (in pm) Follow-up type: In-patient    Judy [name], Judy [name] [name], 7:16 AM

## 2016-04-03 NOTE — Progress Notes (Signed)
Patient ID: Judy [name], female   DOB: [birth year], 20 y.o.   MRN: [id number]  POSTPARTUM PROGRESS NOTE  Post Partum Day 1  Subjective:  Judy Silllisha M Hakimi is a 20 y.o. [id number] 1018w6d s/p SVD.  No acute events overnight.  Pt denies problems with ambulating, voiding or po intake.  She denies nausea or vomiting.  Pain is moderately controlled.  She has had flatus. She has had bowel movement.  Lochia Minimal.   Objective: Blood pressure (!) 99/44, pulse 75, temperature 98.4 F (36.9 C), temperature source Oral, resp. rate 16, height 5\' 2"  (1.575 m), weight 59.4 kg (131 lb), last menstrual period 07/02/2015, SpO2 100 %, unknown if currently breastfeeding.  Physical Exam:  General: alert, cooperative and no distress Lochia:normal flow Chest: CTAB Heart: RRR no m/r/g Abdomen: +BS, soft, nontender,  Uterine Fundus: firm, below level of umbilicus DVT Evaluation: No calf swelling or tenderness Extremities: no edema   Recent Labs  04/02/16 0616  HGB 10.1*  HCT 30.7*    Assessment/Plan:  ASSESSMENT: Judy Silllisha M Coddington is a 20 y.o. [id number] 5318w6d s/p SVD  Plan for discharge tomorrow   LOS: 1 day   Tillman Sersngela C Riccio, DO 04/03/2016, 7:42 AM   CNM attestation Post Partum Day #1 I have seen and examined this patient and agree with above documentation in the resident's note.   Judy Silllisha M Madej is a 20 y.o. [phone] s/p SVD.  Pt denies problems with ambulating, voiding or po intake. Pain is well controlled.  Plan for birth control is Nexplanon.  Method of Feeding: breast  PE:  BP (!) 99/44 (BP Location: Right Arm)   Pulse 75   Temp 98.4 F (36.9 C) (Oral)   Resp 16   Ht 5\' 2"  (1.575 m)   Wt 59.4 kg (131 lb)   LMP 07/02/2015 (Approximate)   SpO2 100%   Breastfeeding? Unknown   BMI 23.96 kg/m  Fundus firm  Plan for discharge: 04/04/16  Cam HaiSHAW, KIMBERLY, CNM 11:45 AM  04/03/2016

## 2016-04-04 MED ORDER — IBUPROFEN 600 MG PO TABS
600.0000 mg | ORAL_TABLET | Freq: Four times a day (QID) | ORAL | 0 refills | Status: DC
Start: 2016-04-04 — End: 2017-01-04

## 2016-04-04 NOTE — Discharge Summary (Signed)
OB Discharge Summary  Patient Name: BILLIEJEAN SCHIMEK DOB: [birth year] MRN: [id number]  Date of admission: 04/02/2016 Delivering MD: Ernestina Penna MICHAEL   Date of discharge: 04/04/2016  Admitting diagnosis: 40 WEEKS CTX Intrauterine pregnancy: [redacted]w[redacted]d     Secondary diagnosis:Active Problems:   Normal labor  Additional problems: none     Discharge diagnosis: Term Pregnancy Delivered                                                                     Post partum procedures:none  Augmentation: none  Complications: None  Hospital course:  Onset of Labor With Vaginal Delivery     20 y.o. yo [id number] at [redacted]w[redacted]d was admitted in Active Labor on 04/02/2016. Patient had an uncomplicated labor course as follows:  Membrane Rupture Time/Date: 8:55 AM ,04/02/2016   Intrapartum Procedures: Episiotomy: None [1]                                         Lacerations:  None [1]  Patient had a delivery of a Viable infant. 04/02/2016  Information for the patient's newborn:  Ione, [place] [[id number]]  Delivery Method: Vag-Spont    Pateint had an uncomplicated postpartum course.  She is ambulating, tolerating a regular diet, passing flatus, and urinating well. Patient is discharged home in stable condition on 04/04/16.    Physical exam Vitals:   04/02/16 1540 04/03/16 0551 04/03/16 1800 04/04/16 0559  BP: 129/64 (!) 99/44 (!) 125/58 (!) 114/59  Pulse: 82 75 86 (!) 56  Resp: 20 16 18 18   Temp: 98 F (36.7 C) 98.4 F (36.9 C) 98.4 F (36.9 C) 97.5 F (36.4 C)  TempSrc:  Oral Oral Oral  SpO2:      Weight:      Height:       General: alert, cooperative and no distress Lochia: appropriate Uterine Fundus: firm Incision: N/A DVT Evaluation: No evidence of DVT seen on physical exam. Labs: Lab Results  Component Value Date   WBC 10.4 04/02/2016   HGB 10.1 (L) 04/02/2016   HCT 30.7 (L) 04/02/2016   MCV 69.9 (L) 04/02/2016   PLT 214 04/02/2016   CMP Latest Ref Rng &  Units 12/06/2015  Glucose 65 - 99 mg/dL 696(E)  BUN 6 - 20 mg/dL <9(B)  Creatinine 2.84 - 1.00 mg/dL 1.32  Sodium 440 - 102 mmol/L 136  Potassium 3.5 - 5.1 mmol/L 3.4(L)  Chloride 101 - 111 mmol/L 107  CO2 22 - 32 mmol/L 23  Calcium 8.9 - 10.3 mg/dL 7.2(Z)  Total Protein 6.0 - 8.3 g/dL -  Total Bilirubin 0.2 - 1.1 mg/dL -  Alkaline Phos 39 - 366 U/L -  AST 0 - 37 U/L -  ALT 0 - 35 U/L -    Discharge instruction: per After Visit Summary and "Baby and Me Booklet".  After Visit Meds:    Medication List    TAKE these medications   albuterol 108 (90 Base) MCG/ACT inhaler Commonly known as:  PROVENTIL HFA;VENTOLIN HFA Inhale 2 puffs into the lungs every 6 (six) hours as needed for wheezing or shortness of breath.  ibuprofen 600 MG tablet Commonly known as:  ADVIL,MOTRIN Take 1 tablet (600 mg total) by mouth every 6 (six) hours.   VITAFOL GUMMIES 3.33-0.333-34.8 MG Chew Chew 3 each by mouth daily.       Diet: low salt diet  Activity: Advance as tolerated. Pelvic rest for 6 weeks.   Outpatient follow up:6 weeks Follow up Appt:No future appointments. Follow up visit: No Follow-up on file.  Postpartum contraception: Nexplanon  Newborn Data: Live born female  Birth Weight: 5 lb 13 oz (2635 g) APGAR: 8, 9  Baby Feeding: Breast Disposition:home with mother   04/04/2016 Hilton SinclairKaty D Mayo, MD  CNM attestation I have seen and examined this patient and agree with above documentation in the resident's note.   Vinie Silllisha M Rathel is a 20 y.o. [phone] s/p SVD.   Pain is well controlled.  Plan for birth control is Nexplanon.  Method of Feeding: breast  PE:  BP (!) 114/59 (BP Location: Right Arm)   Pulse (!) 56   Temp 97.5 F (36.4 C) (Oral)   Resp 18   Ht 5\' 2"  (1.575 m)   Wt 59.4 kg (131 lb)   LMP 07/02/2015 (Approximate)   SpO2 100%   Breastfeeding? Unknown   BMI 23.96 kg/m  Fundus firm   Recent Labs  04/02/16 0616  HGB 10.1*  HCT 30.7*     Plan:  discharge today - postpartum care discussed - f/u clinic in 6 weeks for postpartum visit   Cam HaiSHAW, Lorin Gawron, CNM 9:28 AM  04/04/2016

## 2016-04-04 NOTE — Lactation Note (Signed)
This note was copied from a baby's chart. Lactation Consultation Note  Patient Name: Girl Judithe Modestlisha Ouderkirk GUYQI'HToday's Date: 04/04/2016 Reason for consult: Follow-up assessment    With this mom of a LPI, now 48 hours old, and 36 1/7 weeks CGA. Mom is very full and engorged this morning. She has not been pumping consistently. I advised her to pump every 3 hours, 8 times a day, until she stops dripping, to protect her milk supply and provide EBm for her baby. Mom is now using ice to both breasts, and pumping with DEP, and is expressing large flow of milk. Mom given hand pump , and knows how to use. Mom was told she could loan a DEP [facility], and she said she will let me know if she decides to do so. Mom knows to call lactation as needed.   Maternal Data    Feeding Feeding Type: Bottle Fed - Formula  LATCH Score/Interventions          Comfort (Breast/Nipple): Engorged, cracked, bleeding, large blisters, severe discomfort Problem noted: Engorgment  Problem noted: Filling Interventions (Filling): Hand pump;Double electric pump        Lactation Tools Discussed/Used WIC Program: Yes ([facility]   Consult Status Consult Status: Complete Follow-up type: Call as needed    Alfred LevinsLee, Vaishnav Demartin Anne 04/04/2016, 9:03 AM

## 2016-04-04 NOTE — Lactation Note (Signed)
Lactation Consultation Note  Patient Name: Judy [name] Vinie SillZOXWR'UToday's Date: 04/04/2016 Reason for consult: Follow-up assessment   See baby's note  Maternal Data    Feeding    LATCH Score/Interventions          Comfort (Breast/Nipple): Engorged, cracked, bleeding, large blisters, severe discomfort Problem noted: Engorgment Intervention(s): Ice;Other (comment) (DEP)           Lactation Tools Discussed/Used WIC Program: Yes (fax sent for DEP)   Consult Status Consult Status: Complete Follow-up type: Call as needed    Alfred LevinsLee, Keelee Yankey Anne 04/04/2016, 9:01 AM

## 2016-04-04 NOTE — Progress Notes (Signed)
AC will assist [name] with obtaining a car seat through the scholarship program. [name] is prepared to pay $30.00.   Liset Mcmonigle Boyd-Gilyard, MSW, LCSW Clinical Social Work [phone]  

## 2016-04-04 NOTE — Discharge Instructions (Signed)

## 2016-04-05 ENCOUNTER — Ambulatory Visit: Payer: Self-pay

## 2016-04-05 NOTE — Lactation Note (Signed)
This note was copied from a baby's chart. Lactation Consultation Note  Patient Name: [name] WUJWJ'XToday's Date: 04/05/2016 Reason for consult: Follow-up assessment (per mom both breast are comfortable )  MBU RN Milagros ReapDonna Esker [facility] eng[facility] this am , and she had started the ice , pumping  And feeding. [facility] - per mom both breast feel comfortable and feeding and pumping helped. LC reviewed sore nipple and engorgement prevention and tx. [facility]  [facility] . Instructions for filling out paperwork explained to mom.  [facility] 2nd visit [facility] and LC received the $30.OO IN cash.  LC explained the difference of the pumping phases to mom on the rental.  LC also offered mom a LC O/P appt. And mom requested to be able to get home check on her DRs.appts. And call LC office  Back. [facility].  Mother informed of post-discharge support and given phone number to the lactation department, including services for phone  call assistance; out-patient appointments; and breastfeeding support group. List of other breastfeeding resources in the community  given in the handout. Encouraged mother to call for problems or concerns related to breastfeeding. Maternal Data    Feeding Feeding Type: Bottle Fed - Breast Milk  LATCH Score/Interventions                Intervention(s): Breastfeeding basics reviewed     Lactation Tools Discussed/Used Tools: Pump Breast pump type: Double-Electric Breast Pump WIC Program: Yes   Consult Status Consult Status: Complete Date: 04/05/16 Follow-up type: In-patient    Matilde SprangMargaret Ann Jessyka Austria 04/05/2016, 2:12 PM

## 2016-04-05 NOTE — Lactation Note (Signed)
This note was copied from a baby's chart. Lactation Consultation Note  Patient Name: Judy [name] ZOXWR'UToday's Date: 04/05/2016 Reason for consult: Follow-up assessment;Breast/nipple pain   Follow up with mom at RN's Request due to engorgement. Mom is pumping about every 3 hours and getting 60 cc EBM. She is using DEBP on Maintenance setting. Mom reports her nipples are very sore and noted to be reddened. She is using coconut oil to lubricate flanges. She is using # 27 flanges. Increased her to # 30 flanges. We laid her back and did reverse pressure and mom is using ice, mom pumped for 10 minutes, did more massage and pumped for 10 minutes, more massage and reverse pressure and milk is noted to be flowing better. She reports she feels much better. She is taking Tylenol for pain, she does not have Motrin here at the hospital, enc her to ask dad to bring to hospital.    Infant was fed a bottle of EBM. Enc mom to use her EBM to feed infant and not to use formula. Mom has about 180 cc pumped EBM in the refrigerator.  Enc mom to consider DEBP rental for d/c. 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 Enc mom to continue to ice breasts, reverse pressure, massage and pumping every 2 hours tonight. Mom voiced understanding. Follow up tomorrow.   Maternal Data Does the patient have breastfeeding experience prior to this delivery?: Yes  Feeding Feeding Type: Bottle Fed - Breast Milk Nipple Type: Slow - flow  LATCH Score/Interventions                      Lactation Tools Discussed/Used Pump Review: Setup, frequency, and cleaning;Milk Storage   Consult Status Consult Status: Follow-up Date: 04/06/16 Follow-up type: In-patient    Silas FloodSharon S Evagelia Knack 04/05/2016, 12:23 AM

## 2016-04-22 ENCOUNTER — Ambulatory Visit (HOSPITAL_COMMUNITY): Admission: RE | Admit: 2016-04-22 | Payer: Medicaid Other | Source: Ambulatory Visit

## 2016-05-13 ENCOUNTER — Encounter: Payer: Self-pay | Admitting: Obstetrics and Gynecology

## 2016-05-13 ENCOUNTER — Encounter: Payer: Self-pay | Admitting: *Deleted

## 2016-05-13 ENCOUNTER — Ambulatory Visit (INDEPENDENT_AMBULATORY_CARE_PROVIDER_SITE_OTHER): Payer: 59 | Admitting: Obstetrics and Gynecology

## 2016-05-13 MED ORDER — VITAFOL GUMMIES 3.33-0.333-34.8 MG PO CHEW: 3.0000 | CHEWABLE_TABLET | Freq: Every day | ORAL | 11 refills | Status: DC

## 2016-05-13 NOTE — Progress Notes (Signed)
Obstetrics Visit Postpartum Visit  Appointment Date: 05/13/2016  [facility]  Primary Care Provider: No PCP Per Patient  Chief Complaint: routine PP visit  History of Present Illness: Judy [name] is a 20 y.o. African-American [phone] (Patient's last menstrual period was 05/10/2016.), seen for the above chief complaint. Her past medical history is significant for PTB x 2, h/o PP depression  She is s/p SVD/intact perineum on 10/12; she was discharged to home on PPD#2  Vaginal bleeding or discharge: Yes. Bleeding started this week and feels like her period Breast or formula feeding: both Intercourse: No  Contraception after delivery: abstinence PP depression s/s: No  Any bowel or bladder issues: No  Pap smear: n/a  Review of Systems: Her 12 point review of systems is negative or as noted in the History of Present Illness.  Medications Ms. Kegel had no medications administered during this visit. Current Outpatient Prescriptions  Medication Sig Dispense Refill  . albuterol (PROVENTIL HFA;VENTOLIN HFA) 108 (90 BASE) MCG/ACT inhaler Inhale 2 puffs into the lungs every 6 (six) hours as needed for wheezing or shortness of breath.    Marland Kitchen. ibuprofen (ADVIL,MOTRIN) 600 MG tablet Take 1 tablet (600 mg total) by mouth every 6 (six) hours. 30 tablet 0  . Prenatal Vit-Fe Phos-FA-Omega (VITAFOL GUMMIES) 3.33-0.333-34.8 MG CHEW Chew 3 each by mouth daily. 90 tablet 11   No current facility-administered medications for this visit.     Allergies Tape  Physical Exam:  BP 107/69   Pulse 92   Wt 124 lb (56.2 kg)   LMP 05/10/2016   BMI 22.68 kg/m  Body mass index is 22.68 kg/m. General appearance: Well nourished, well developed female in no acute distress.  Respiratory:  Normal respiratory effort Neuro/Psych:  Normal mood and affect.  Skin:  Warm and dry.   Laboratory: none  PP Depression Screening:  0 on EPDS  Assessment: pt doing well  Plan:  routine pp visit. Pt would like nexplanon so medicaid order form signed and pt to come back in 1 month for nexplanon insertion and pap smear since she'll be close to age 20. Pt states she'll just abstinence until then.   Pt states she wasn't on 17p this pregnancy. Advised her in future pregnancies that would recommend that and serial CLs.   RTC [id number]  Cornelia Copaharlie Delshawn Stech, Jr MD Attending [facility] Mid[facility](Faculty Practice)

## 2016-05-18 ENCOUNTER — Encounter: Payer: Self-pay | Admitting: *Deleted

## 2016-06-11 ENCOUNTER — Ambulatory Visit: Payer: Medicaid Other | Admitting: Obstetrics

## 2016-07-02 ENCOUNTER — Ambulatory Visit: Payer: Medicaid Other | Admitting: Obstetrics

## 2016-08-31 ENCOUNTER — Ambulatory Visit: Payer: PRIVATE HEALTH INSURANCE | Admitting: Obstetrics

## 2017-01-03 ENCOUNTER — Encounter (HOSPITAL_COMMUNITY): Payer: Self-pay

## 2017-01-03 ENCOUNTER — Inpatient Hospital Stay (HOSPITAL_COMMUNITY)
Admission: AD | Admit: 2017-01-03 | Discharge: 2017-01-04 | Disposition: A | Discharge status: Home / Self Care | Payer: Self-pay | Source: Ambulatory Visit | Attending: Obstetrics & Gynecology | Admitting: Obstetrics & Gynecology

## 2017-01-03 ENCOUNTER — Inpatient Hospital Stay (HOSPITAL_COMMUNITY): Payer: Self-pay

## 2017-01-03 DIAGNOSIS — B9689 Other specified bacterial agents as the cause of diseases classified elsewhere: Secondary | ICD-10-CM | POA: Insufficient documentation

## 2017-01-03 DIAGNOSIS — R109 Unspecified abdominal pain: Secondary | ICD-10-CM

## 2017-01-03 DIAGNOSIS — O4690 Antepartum hemorrhage, unspecified, unspecified trimester: Secondary | ICD-10-CM

## 2017-01-03 DIAGNOSIS — O4691 Antepartum hemorrhage, unspecified, first trimester: Secondary | ICD-10-CM | POA: Insufficient documentation

## 2017-01-03 DIAGNOSIS — Z3A01 Less than 8 weeks gestation of pregnancy: Secondary | ICD-10-CM | POA: Insufficient documentation

## 2017-01-03 DIAGNOSIS — Z91040 Latex allergy status: Secondary | ICD-10-CM | POA: Insufficient documentation

## 2017-01-03 DIAGNOSIS — O23591 Infection of other part of genital tract in pregnancy, first trimester: Secondary | ICD-10-CM | POA: Insufficient documentation

## 2017-01-03 DIAGNOSIS — O26899 Other specified pregnancy related conditions, unspecified trimester: Secondary | ICD-10-CM

## 2017-01-03 DIAGNOSIS — O3680X0 Pregnancy with inconclusive fetal viability, not applicable or unspecified: Secondary | ICD-10-CM

## 2017-01-03 DIAGNOSIS — N760 Acute vaginitis: Secondary | ICD-10-CM

## 2017-01-03 LAB — CBC
HEMATOCRIT: 33.9 % — AB (ref 36.0–46.0)
HEMOGLOBIN: 11.5 g/dL — AB (ref 12.0–15.0)
MCH: 26 pg (ref 26.0–34.0)
MCHC: 33.9 g/dL (ref 30.0–36.0)
MCV: 76.7 fL — ABNORMAL LOW (ref 78.0–100.0)
PLATELETS: 312 10*3/uL (ref 150–400)
RBC: 4.42 MIL/uL (ref 3.87–5.11)
RDW: 13.4 % (ref 11.5–15.5)
WBC: 8.6 10*3/uL (ref 4.0–10.5)

## 2017-01-03 LAB — POCT PREGNANCY, URINE: Preg Test, Ur: POSITIVE — AB

## 2017-01-03 LAB — URINALYSIS, ROUTINE W REFLEX MICROSCOPIC
Bilirubin Urine: NEGATIVE
GLUCOSE, UA: NEGATIVE mg/dL
Ketones, ur: NEGATIVE mg/dL
Nitrite: NEGATIVE
PH: 6 (ref 5.0–8.0)
Protein, ur: 30 mg/dL — AB
SPECIFIC GRAVITY, URINE: 1.028 (ref 1.005–1.030)

## 2017-01-03 LAB — WET PREP, GENITAL
Sperm: NONE SEEN
TRICH WET PREP: NONE SEEN
Yeast Wet Prep HPF POC: NONE SEEN

## 2017-01-03 LAB — HCG, QUANTITATIVE, PREGNANCY: hCG, Beta Chain, Quant, S: 293 m[IU]/mL — ABNORMAL HIGH (ref <5)

## 2017-01-03 NOTE — MAU Note (Addendum)
Pt here with c/o vaginal bleeding all day. Having cramping off and on. Had intercourse a couple of days ago. Wants STD testing

## 2017-01-03 NOTE — MAU Provider Note (Signed)
History     CSN: [id number]  Arrival date and time: 01/03/17 2151   First Provider Initiated Contact with Patient 01/03/17 2237     Chief Complaint  Patient presents with  . Vaginal Bleeding   HPI Judy [name] is a 21 y.o. [phone] at [id number] who presents with a positive pregnancy test, vaginal bleeding and lower abdominal pain. She reports lower abdominal pain that started last night. She rates it a 4/10 and has not tried anything for the pain. She states she noticed spotting when she wiped this morning. Denies any other abnormal vaginal discharge. She is requesting STD testing.   OB History    Gravida Para Term Preterm AB Living   4 3 1 2  0 3   SAB TAB Ectopic Multiple Live Births   0 0 0 0 3      Past Medical History:  Diagnosis Date  . Asthma   . History of preterm delivery   . Post partum depression   . Yeast infection of the vagina     Past Surgical History:  Procedure Laterality Date  . NO PAST SURGERIES      Family History  Problem Relation Age of Onset  . Hypertension Mother   . Diabetes Mother   . Asthma Father     Social History  Substance Use Topics  . Smoking status: Never Smoker  . Smokeless tobacco: Never Used  . Alcohol use No    Allergies:  Allergies  Allergen Reactions  . Latex Dermatitis  . Tape Rash    Prescriptions Prior to Admission  Medication Sig Dispense Refill Last Dose  . albuterol (PROVENTIL HFA;VENTOLIN HFA) 108 (90 BASE) MCG/ACT inhaler Inhale 2 puffs into the lungs every 6 (six) hours as needed for wheezing or shortness of breath.   Taking  . ibuprofen (ADVIL,MOTRIN) 600 MG tablet Take 1 tablet (600 mg total) by mouth every 6 (six) hours. 30 tablet 0 Taking  . Prenatal Vit-Fe Phos-FA-Omega (VITAFOL GUMMIES) 3.33-0.333-34.8 MG CHEW Chew 3 each by mouth daily. 90 tablet 11     Review of Systems  Constitutional: Negative.  Negative for chills and fever.  HENT: Negative.   Respiratory: Negative.  Negative for shortness  of breath.   Cardiovascular: Negative.  Negative for chest pain.  Gastrointestinal: Positive for abdominal pain. Negative for constipation, diarrhea, nausea and vomiting.  Genitourinary: Positive for vaginal bleeding. Negative for dysuria and vaginal discharge.  Neurological: Negative.  Negative for dizziness and headaches.  Psychiatric/Behavioral: Negative.    Physical Exam   Blood pressure (!) 113/58, pulse 96, temperature 98.4 F (36.9 C), temperature source Oral, resp. rate 18, height 5\' 2"  (1.575 m), weight 129 lb (58.5 kg), last menstrual period 11/30/2016, SpO2 100 %, unknown if currently breastfeeding.  Physical Exam  Nursing note and vitals reviewed. Constitutional: She appears well-developed and well-nourished.  HENT:  Head: Normocephalic and atraumatic.  Eyes: Conjunctivae are normal. No scleral icterus.  Cardiovascular: Normal rate, regular rhythm and normal heart sounds.   Respiratory: Effort normal. No respiratory distress.  GI: Soft. She exhibits no distension. There is tenderness.  Genitourinary: Cervix exhibits friability. Cervix exhibits no motion tenderness. There is bleeding in the vagina. Vaginal discharge found.  Genitourinary Comments: Small amount of frothy yellow discharge  Neurological: She is alert.  Skin: Skin is warm and dry.  Psychiatric: She has a normal mood and affect. Her behavior is normal. Judgment and thought content normal.   Bimanual exam: Cervix 0/long/high, firm, anterior, neg CMT,  uterus nontender, adnexa without tenderness, enlargement, or mass  MAU Course  Procedures Results for orders placed or performed during the hospital encounter of 01/03/17 (from the past 24 hour(s))  Urinalysis, Routine w reflex microscopic     Status: Abnormal   Collection Time: 01/03/17 10:07 PM  Result Value Ref Range   Color, Urine YELLOW YELLOW   APPearance CLOUDY (A) CLEAR   Specific Gravity, Urine 1.028 1.005 - 1.030   pH 6.0 5.0 - 8.0   Glucose, UA  NEGATIVE NEGATIVE mg/dL   Hgb urine dipstick MODERATE (A) NEGATIVE   Bilirubin Urine NEGATIVE NEGATIVE   Ketones, ur NEGATIVE NEGATIVE mg/dL   Protein, ur 30 (A) NEGATIVE mg/dL   Nitrite NEGATIVE NEGATIVE   Leukocytes, UA LARGE (A) NEGATIVE   RBC / HPF 6-30 0 - 5 RBC/hpf   WBC, UA TOO NUMEROUS TO COUNT 0 - 5 WBC/hpf   Bacteria, UA RARE (A) NONE SEEN   Squamous Epithelial / LPF 6-30 (A) NONE SEEN   Mucous PRESENT   Pregnancy, urine POC     Status: Abnormal   Collection Time: 01/03/17 10:22 PM  Result Value Ref Range   Preg Test, Ur POSITIVE (A) NEGATIVE  Wet prep, genital     Status: Abnormal   Collection Time: 01/03/17 10:40 PM  Result Value Ref Range   Yeast Wet Prep HPF POC NONE SEEN NONE SEEN   Trich, Wet Prep NONE SEEN NONE SEEN   Clue Cells Wet Prep HPF POC PRESENT (A) NONE SEEN   WBC, Wet Prep HPF POC MANY (A) NONE SEEN   Sperm NONE SEEN   CBC     Status: Abnormal   Collection Time: 01/03/17 10:44 PM  Result Value Ref Range   WBC 8.6 4.0 - 10.5 K/uL   RBC 4.42 3.87 - 5.11 MIL/uL   Hemoglobin 11.5 (L) 12.0 - 15.0 g/dL   HCT 10.2 (L) 72.5 - 36.6 %   MCV 76.7 (L) 78.0 - 100.0 fL   MCH 26.0 26.0 - 34.0 pg   MCHC 33.9 30.0 - 36.0 g/dL   RDW 44.0 34.7 - 42.5 %   Platelets 312 150 - 400 K/uL  hCG, quantitative, pregnancy     Status: Abnormal   Collection Time: 01/03/17 10:44 PM  Result Value Ref Range   hCG, Beta Chain, Quant, S 293 (H) <5 mIU/mL   US Ob Comp Less 14 Wks  Result Date: 01/04/2017 CLINICAL DATA:  Vaginal bleeding and cramping. Gestational age by last menstrual period 4 weeks and 6 days. Beta HCG 293. EXAM: OBSTETRIC <14 WK [place] AND TRANSVAGINAL OB US TECHNIQUE: Both transabdominal and transvaginal ultrasound examinations were performed for complete evaluation of the gestation as well as the with the parent decidual reaction. Maternal uterus, adnexal regions, and pelvic cul-de-sac. Transvaginal technique was performed to assess early pregnancy. COMPARISON:  None.  FINDINGS: Intrauterine gestational sac: 11 mm anechoic possible gestational sac within the endometrium, Yolk sac:  Not present Embryo:  Not present Cardiac Activity: Not present MSD: 3  mm   for w   6  d Subchorionic hemorrhage:  None visualized. Maternal uterus/adnexae: Normal adnexae. Small amount of free fluid. IMPRESSION: Probable early intrauterine gestational sac, but no yolk sac, fetal pole, or cardiac activity yet visualized. Recommend follow-up quantitative B-HCG levels and follow-up US in 14 days to assess viability. This recommendation follows SRU consensus guidelines: Diagnostic Criteria for Nonviable Pregnancy Early in the First Trimester. Malva Limes Med 2013; 956:3875-64. Electronically Signed  By: Awilda Metro M.D.   On: 01/04/2017 00:00   US Ob Transvaginal  Result Date: 01/04/2017 CLINICAL DATA:  Vaginal bleeding and cramping. Gestational age by last menstrual period 4 weeks and 6 days. Beta HCG 293. EXAM: OBSTETRIC <14 WK [place] AND TRANSVAGINAL OB US TECHNIQUE: Both transabdominal and transvaginal ultrasound examinations were performed for complete evaluation of the gestation as well as the with the parent decidual reaction. Maternal uterus, adnexal regions, and pelvic cul-de-sac. Transvaginal technique was performed to assess early pregnancy. COMPARISON:  None. FINDINGS: Intrauterine gestational sac: 11 mm anechoic possible gestational sac within the endometrium, Yolk sac:  Not present Embryo:  Not present Cardiac Activity: Not present MSD: 3  mm   for w   6  d Subchorionic hemorrhage:  None visualized. Maternal uterus/adnexae: Normal adnexae. Small amount of free fluid. IMPRESSION: Probable early intrauterine gestational sac, but no yolk sac, fetal pole, or cardiac activity yet visualized. Recommend follow-up quantitative B-HCG levels and follow-up US in 14 days to assess viability. This recommendation follows SRU consensus guidelines: Diagnostic Criteria for Nonviable Pregnancy Early in the  First Trimester. Malva Limes Med 2013; 161:0960-45. Electronically Signed   By: Awilda Metro M.D.   On: 01/04/2017 00:00   MDM UA, UPT Urine culture Wet prep and gc/chlamydia CBC, quant, HIV, RPR US OB Transvaginal  US OB Comp Less 14 weeks US shows probable early gestational sac, no yolk sac or fetal pole Assessment and Plan   1. Pregnancy of unknown anatomic location   2. Vaginal bleeding in pregnancy   3. Abdominal cramping affecting pregnancy   4. Bacterial vaginosis    -Discharge patient home in stable condition -Prescription for metronidazole sent to patient's pharmacy -Follow up at [facility] clinic Tuesday for repeat bHCG -Strict ectopic precautions reviewed with patient -Encouraged to return here or to other Urgent Care/ED if she develops worsening of symptoms, increase in pain, fever, or other concerning symptoms.   Cleone Slim SNM 01/04/2017, 12:28 AM   I confirm that I have verified the information documented in the nurse midwife student's note and that I have also personally reperformed the physical exam and all medical decision making activities.  Katrinka Blazing, [place], CNM 01/04/2017 2:18 AM

## 2017-01-04 DIAGNOSIS — O26891 Other specified pregnancy related conditions, first trimester: Secondary | ICD-10-CM

## 2017-01-04 DIAGNOSIS — Z3A01 Less than 8 weeks gestation of pregnancy: Secondary | ICD-10-CM

## 2017-01-04 DIAGNOSIS — O4691 Antepartum hemorrhage, unspecified, first trimester: Secondary | ICD-10-CM

## 2017-01-04 DIAGNOSIS — R109 Unspecified abdominal pain: Secondary | ICD-10-CM

## 2017-01-04 LAB — GC/CHLAMYDIA PROBE AMP (~~LOC~~) NOT AT ARMC
Chlamydia: NEGATIVE
Neisseria Gonorrhea: NEGATIVE

## 2017-01-04 MED ORDER — METRONIDAZOLE 500 MG PO TABS: 500.0000 mg | ORAL_TABLET | Freq: Two times a day (BID) | ORAL | 0 refills | Status: DC

## 2017-01-04 NOTE — Discharge Instructions (Signed)

## 2017-01-05 LAB — RPR: RPR Ser Ql: NONREACTIVE

## 2017-01-05 LAB — HIV ANTIBODY (ROUTINE TESTING W REFLEX): HIV SCREEN 4TH GENERATION: NONREACTIVE

## 2017-01-06 ENCOUNTER — Ambulatory Visit: Payer: Self-pay | Admitting: General Practice

## 2017-01-06 ENCOUNTER — Telehealth: Payer: Self-pay | Admitting: General Practice

## 2017-01-06 ENCOUNTER — Telehealth: Payer: Self-pay | Admitting: *Deleted

## 2017-01-06 ENCOUNTER — Other Ambulatory Visit: Payer: Self-pay | Admitting: Obstetrics & Gynecology

## 2017-01-06 DIAGNOSIS — O3680X0 Pregnancy with inconclusive fetal viability, not applicable or unspecified: Secondary | ICD-10-CM

## 2017-01-06 LAB — CULTURE, OB URINE

## 2017-01-06 LAB — HCG, QUANTITATIVE, PREGNANCY: hCG, Beta Chain, Quant, S: 67 m[IU]/mL — ABNORMAL HIGH (ref <5)

## 2017-01-06 MED ORDER — CEPHALEXIN 500 MG PO CAPS: 500.0000 mg | ORAL_CAPSULE | Freq: Four times a day (QID) | ORAL | 0 refills | Status: DC

## 2017-01-06 NOTE — Telephone Encounter (Signed)
Informed patient of UTI and prescription sent to pharmacy. Advised to take all of medication as prescribed. Verbalized understanding.

## 2017-01-06 NOTE — Telephone Encounter (Signed)
Called patient & informed her of results & recommended follow up for next week. Patient verbalized understanding and states she can come 7/25 @ 10am. Patient had no questions

## 2017-01-06 NOTE — Progress Notes (Signed)
Patient here for stat bhcg today. Patient reports spotting last night, but none today. Discussed patient waiting for results today & updated plan of care. Patient states she has to go to work & cannot wait for results. Told patient I will call her around 115 and she should be aware that in an emergency situation she would need to urgently come back to the hospital. Patient verbalized understanding and had no questions. Per Dr Macon LargeAnyanwu, falling bhcg indicates SAB. Patient should return in a week for bhcg to follow to 0 given non visualized pregnancy. Will call patient with results

## 2017-01-11 ENCOUNTER — Ambulatory Visit: Payer: Self-pay | Admitting: Obstetrics

## 2017-01-13 ENCOUNTER — Other Ambulatory Visit: Payer: Self-pay

## 2017-01-14 ENCOUNTER — Other Ambulatory Visit: Payer: Self-pay

## 2017-01-14 DIAGNOSIS — O039 Complete or unspecified spontaneous abortion without complication: Secondary | ICD-10-CM

## 2017-01-15 LAB — BETA HCG QUANT (REF LAB): HCG QUANT: 5 m[IU]/mL

## 2017-02-04 IMAGING — US US OB TRANSVAGINAL
1 series · 15 of 20 positions shown · non-contrast
Comparison: 08/26/2015

CLINICAL DATA: Inconclusive viability

EXAM:
TRANSVAGINAL OB ULTRASOUND
TECHNIQUE: Transvaginal ultrasound was performed for complete evaluation of the
gestation as well as the maternal uterus, adnexal regions, and
pelvic cul-de-sac.

[Series 2: us ob transvaginal · 15 of 20 slices shown]
[im 1/20]
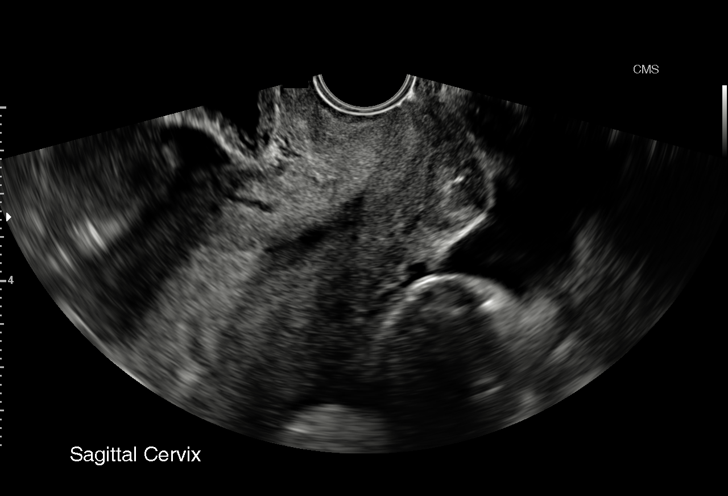
[im 3/20]
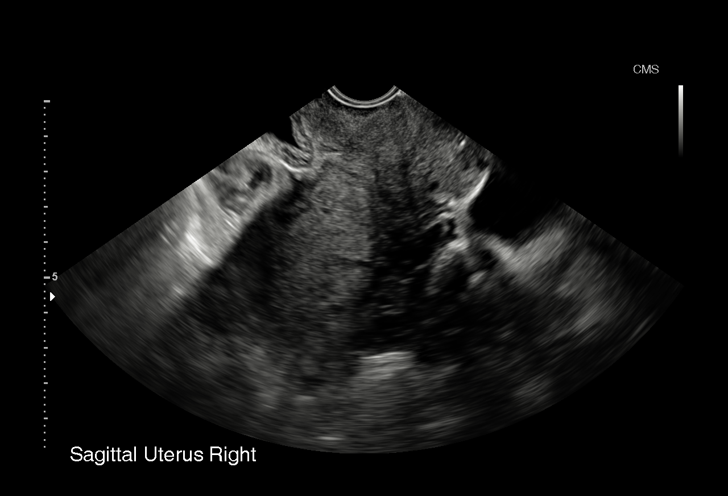
[im 4/20]
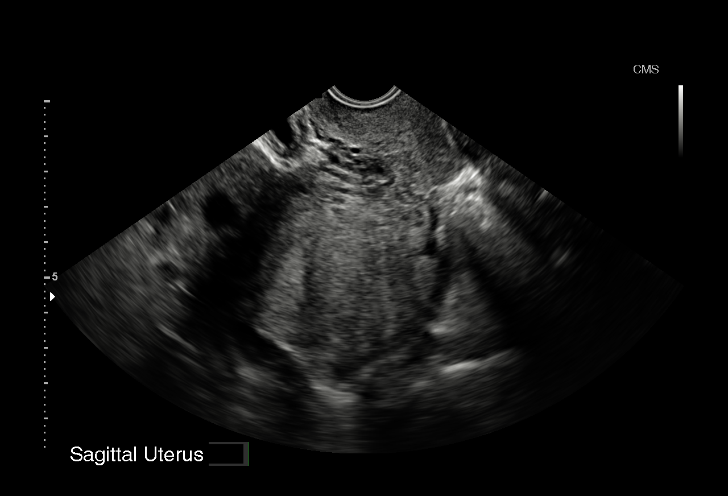
[im 5/20]
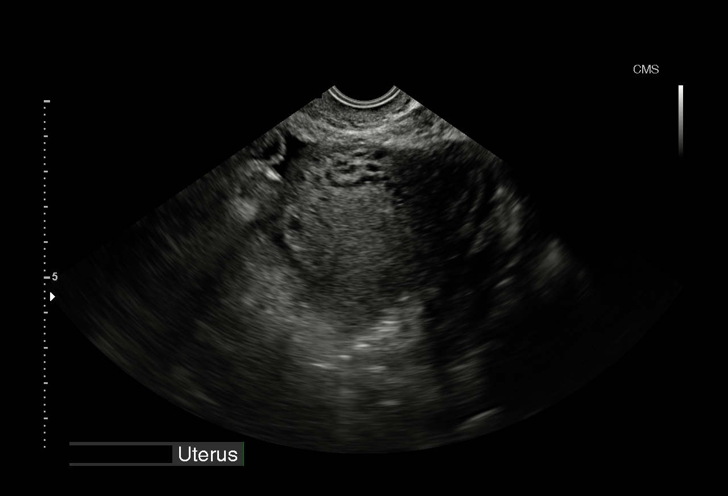
[im 7/20]
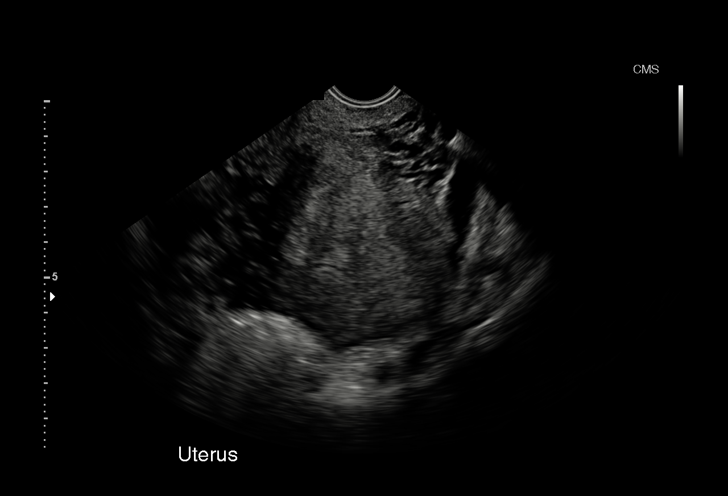
[im 8/20]
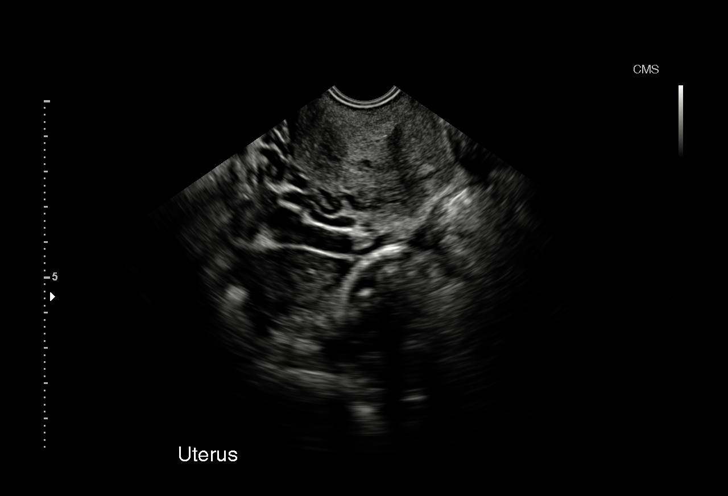
[im 9/20]
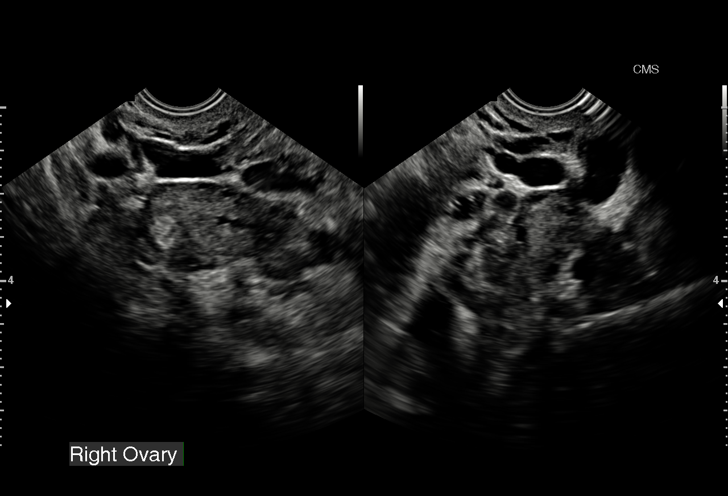
[im 11/20]
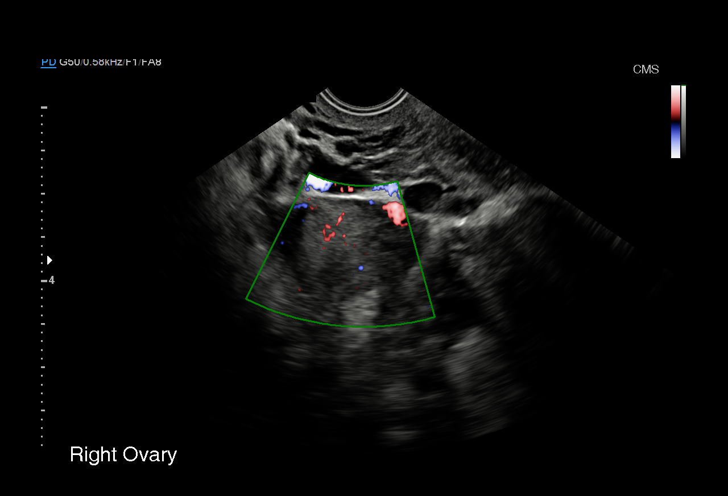
[im 12/20]
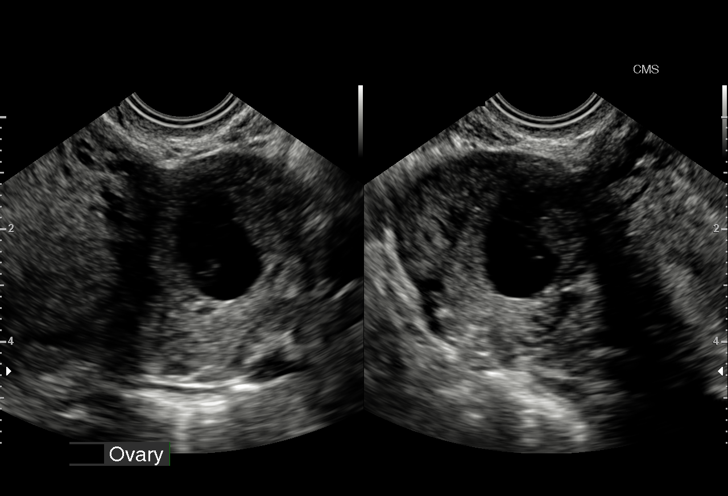
[im 13/20]
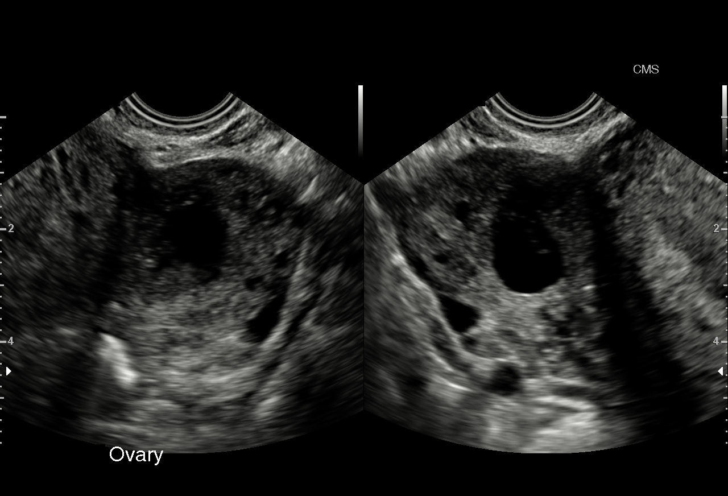
[im 15/20]
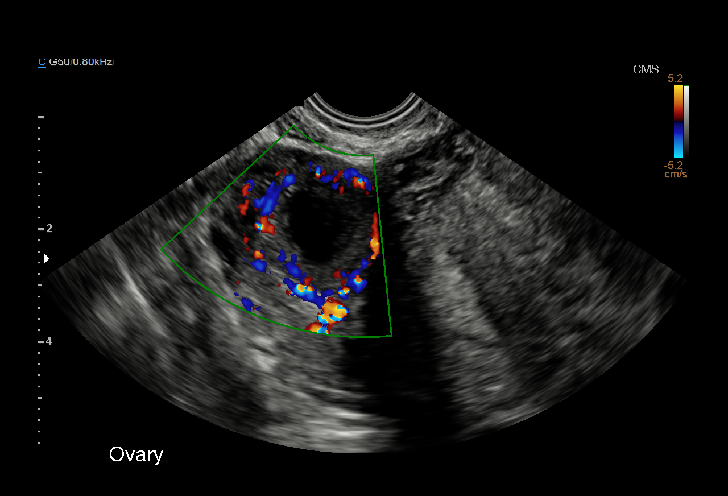
[im 16/20]
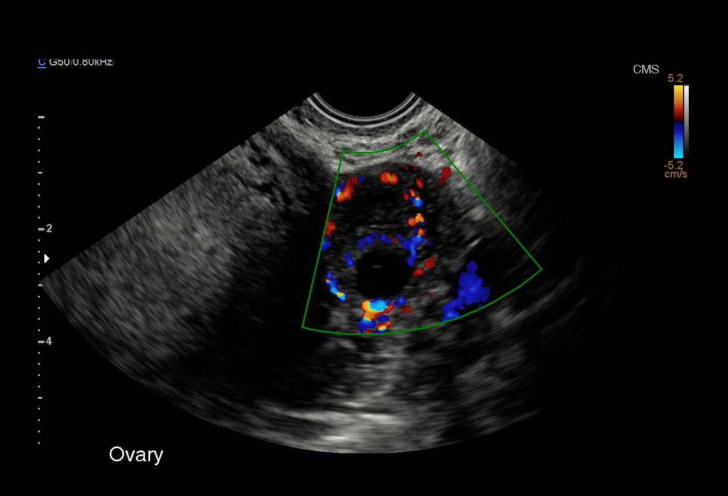
[im 17/20]
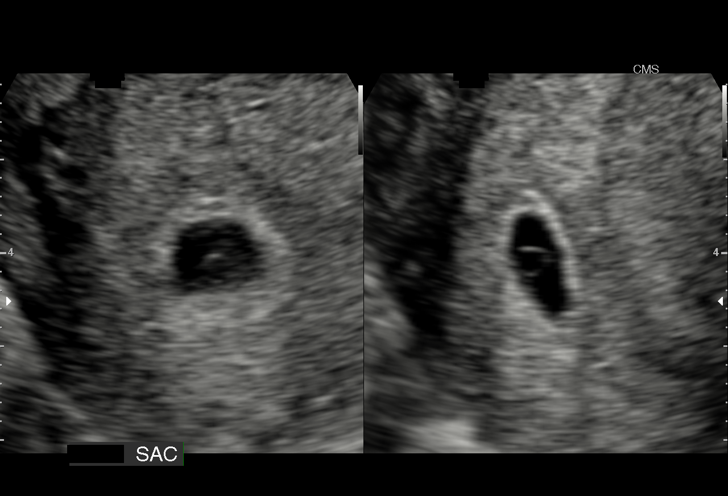
[im 19/20]
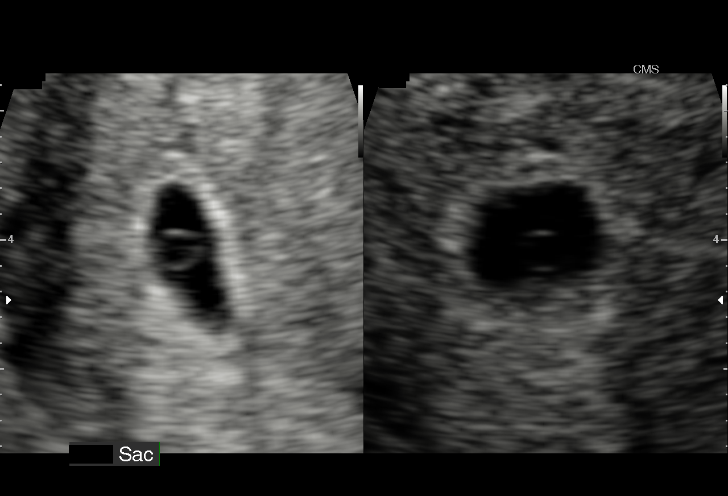
[im 20/20]
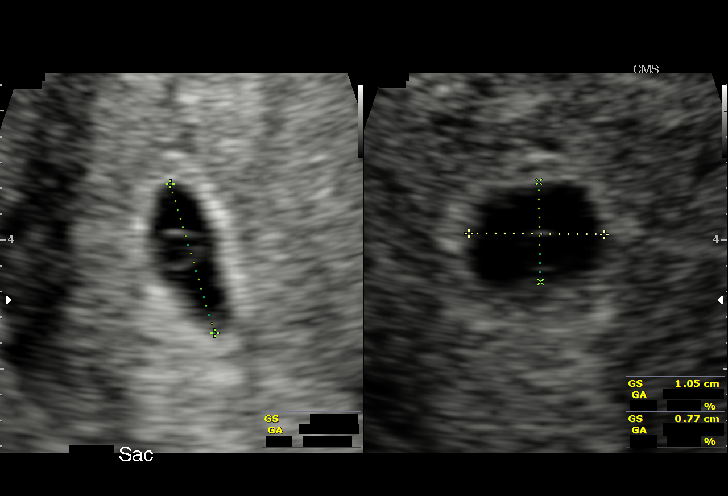

[15 of 20 positions shown; findings below may reference images not displayed]

FINDINGS: Intrauterine gestational sac: Single

Yolk sac:  Yes

Embryo:  No

Cardiac Activity: No

Heart Rate: Not applicable bpm

MSD: 10  mm   5 w   5  d

Maternal uterus/adnexae:

Subchorionic hemorrhage: None

Right ovary: Normal

Left ovary: Normal

Other :None

Free fluid:  None
IMPRESSION: 1. Single intrauterine gestational sac containing a yolk sac.
Probable early intrauterine gestational sac with yolk sac, but no
fetal pole, or cardiac activity yet visualized. Recommend follow-up
quantitative B-HCG levels and follow-up US in 14 days to confirm and
assess viability. This recommendation follows SRU consensus
guidelines: Diagnostic Criteria for Nonviable Pregnancy Early in the
First Trimester. N Engl J Med 3083; [DATE].

## 2017-02-10 ENCOUNTER — Other Ambulatory Visit (HOSPITAL_COMMUNITY)
Admission: RE | Admit: 2017-02-10 | Discharge: 2017-02-10 | Disposition: A | Discharge status: Home / Self Care | Payer: Medicaid Other | Source: Ambulatory Visit | Attending: Obstetrics | Admitting: Obstetrics

## 2017-02-10 ENCOUNTER — Encounter: Payer: Self-pay | Admitting: Obstetrics

## 2017-02-10 ENCOUNTER — Ambulatory Visit (INDEPENDENT_AMBULATORY_CARE_PROVIDER_SITE_OTHER): Payer: Medicaid Other | Admitting: Obstetrics

## 2017-02-10 VITALS — BP 121/74 | HR 96 | Wt 124.8 lb

## 2017-02-10 DIAGNOSIS — Z8759 Personal history of other complications of pregnancy, childbirth and the puerperium: Secondary | ICD-10-CM

## 2017-02-10 DIAGNOSIS — N3001 Acute cystitis with hematuria: Secondary | ICD-10-CM | POA: Diagnosis not present

## 2017-02-10 DIAGNOSIS — N760 Acute vaginitis: Secondary | ICD-10-CM | POA: Diagnosis not present

## 2017-02-10 DIAGNOSIS — Z0000 Encounter for general adult medical examination without abnormal findings: Secondary | ICD-10-CM

## 2017-02-10 DIAGNOSIS — Z30016 Encounter for initial prescription of transdermal patch hormonal contraceptive device: Secondary | ICD-10-CM

## 2017-02-10 DIAGNOSIS — N898 Other specified noninflammatory disorders of vagina: Secondary | ICD-10-CM

## 2017-02-10 DIAGNOSIS — Z3009 Encounter for other general counseling and advice on contraception: Secondary | ICD-10-CM

## 2017-02-10 DIAGNOSIS — B9689 Other specified bacterial agents as the cause of diseases classified elsewhere: Secondary | ICD-10-CM

## 2017-02-10 MED ORDER — VITAFOL GUMMIES 3.33-0.333-34.8 MG PO CHEW: 3.0000 | CHEWABLE_TABLET | Freq: Every day | ORAL | 11 refills | Status: DC

## 2017-02-10 MED ORDER — METRONIDAZOLE 0.75 % VA GEL: 1.0000 | Freq: Two times a day (BID) | VAGINAL | 2 refills | Status: DC

## 2017-02-10 MED ORDER — NORELGESTROMIN-ETH ESTRADIOL 150-35 MCG/24HR TD PTWK: 1.0000 | MEDICATED_PATCH | TRANSDERMAL | 12 refills | Status: DC

## 2017-02-10 NOTE — Progress Notes (Signed)
Patient is in the office for follow up to her early miscarriage. She is doing well. She is on her cycle now and is seems to be normal. Patient would like STD testing. Patient would like to discuss her birth control options.

## 2017-02-10 NOTE — Progress Notes (Signed)
Patient ID: [name], female   DOB: Dec 01, 1995, 21 y.o.   MRN: [id number]  No chief complaint on file.   HPI Judy [name] is a 21 y.o. female.  History of early 1st trimester miscarriage ~ one month ago.  Presents for follow up.  She has no complaints.   LMP 02-07-17.  Currently being treated for BV and a UTI, but could not complete course of medication due to N/V.  She took ~ 4 days of Keflex and Flagyl. HPI  Past Medical History:  Diagnosis Date  . Asthma   . History of preterm delivery   . Post partum depression   . Yeast infection of the vagina     Past Surgical History:  Procedure Laterality Date  . NO PAST SURGERIES      Family History  Problem Relation Age of Onset  . Hypertension Mother   . Diabetes Mother   . Asthma Father     Social History Social History  Substance Use Topics  . Smoking status: Never Smoker  . Smokeless tobacco: Never Used  . Alcohol use No    Allergies  Allergen Reactions  . Latex Dermatitis  . Tape Rash    Current Outpatient Prescriptions  Medication Sig Dispense Refill  . albuterol (PROVENTIL HFA;VENTOLIN HFA) 108 (90 BASE) MCG/ACT inhaler Inhale 2 puffs into the lungs every 6 (six) hours as needed for wheezing or shortness of breath.    . cephALEXin (KEFLEX) 500 MG capsule Take 1 capsule (500 mg total) by mouth 4 (four) times daily. (Patient not taking: Reported on 02/10/2017) 28 capsule 0  . metroNIDAZOLE (METROGEL) 0.75 % vaginal gel Place 1 Applicatorful vaginally 2 (two) times daily. 70 g 2  . norelgestromin-ethinyl estradiol (ORTHO EVRA) 150-35 MCG/24HR transdermal patch Place 1 patch onto the skin once a week. 3 patch 12  . Prenatal Vit-Fe Phos-FA-Omega (VITAFOL GUMMIES) 3.33-0.333-34.8 MG CHEW Chew 3 each by mouth daily. 90 tablet 11   No current facility-administered medications for this visit.     Review of Systems Review of Systems Constitutional: negative for fatigue and weight loss Respiratory: negative  for cough and wheezing Cardiovascular: negative for chest pain, fatigue and palpitations Gastrointestinal: negative for abdominal pain and change in bowel habits Genitourinary:negative Integument/breast: negative for nipple discharge Musculoskeletal:negative for myalgias Neurological: negative for gait problems and tremors Behavioral/Psych: negative for abusive relationship, depression Endocrine: negative for temperature intolerance      Blood pressure 121/74, pulse 96, weight 124 lb 12.8 oz (56.6 kg), last menstrual period 02/07/2017, unknown if currently breastfeeding.  Physical Exam Physical Exam General:   alert  Skin:   no rash or abnormalities  Lungs:   clear to auscultation bilaterally  Heart:   regular rate and rhythm, S1, S2 normal, no murmur, click, rub or gallop  Breasts:   normal without suspicious masses, skin or nipple changes or axillary nodes  Abdomen:  normal findings: no organomegaly, soft, non-tender and no hernia  Pelvis:  External genitalia: normal general appearance Urinary system: urethral meatus normal and bladder without fullness, nontender Vaginal: normal without tenderness, induration or masses Cervix: normal appearance Adnexa: normal bimanual exam Uterus: anteverted and non-tender, normal size    50% of 15 min visit spent on counseling and coordination of care.    Data Reviewed Labs Ultrasound  Assessment     1. History of miscarriage - doing well  2. Acute cystitis with hematuria - taking Keflex  3. Vaginal discharge Rx: - Cervicovaginal ancillary only  4. BV (bacterial vaginosis) Rx: - metroNIDAZOLE (METROGEL) 0.75 % vaginal gel; Place 1 Applicatorful vaginally 2 (two) times daily.  Dispense: 70 g; Refill: 2 - Flagyl discontinued  5. Encounter for other general counseling and advice on contraception - wants to start the Transdermal Patch  6. Encounter for initial prescription of transdermal patch hormonal contraceptive device - Ortho  Evra Patch Rx  7. Routine adult health maintenance Rx: - Prenatal Vit-Fe Phos-FA-Omega (VITAFOL GUMMIES) 3.33-0.333-34.8 MG CHEW; Chew 3 each by mouth daily.  Dispense: 90 tablet; Refill: 11    Plan    Follow up in 3 months for Annual  No orders of the defined types were placed in this encounter.  Meds ordered this encounter  Medications  . norelgestromin-ethinyl estradiol (ORTHO EVRA) 150-35 MCG/24HR transdermal patch    Sig: Place 1 patch onto the skin once a week.    Dispense:  3 patch    Refill:  12  . Prenatal Vit-Fe Phos-FA-Omega (VITAFOL GUMMIES) 3.33-0.333-34.8 MG CHEW    Sig: Chew 3 each by mouth daily.    Dispense:  90 tablet    Refill:  11  . metroNIDAZOLE (METROGEL) 0.75 % vaginal gel    Sig: Place 1 Applicatorful vaginally 2 (two) times daily.    Dispense:  70 g    Refill:  2

## 2017-02-11 LAB — CERVICOVAGINAL ANCILLARY ONLY
Bacterial vaginitis: POSITIVE — AB
CANDIDA VAGINITIS: POSITIVE — AB
CHLAMYDIA, DNA PROBE: NEGATIVE
NEISSERIA GONORRHEA: NEGATIVE
Trichomonas: NEGATIVE

## 2017-02-12 ENCOUNTER — Telehealth: Payer: Self-pay

## 2017-02-12 ENCOUNTER — Other Ambulatory Visit: Payer: Self-pay | Admitting: Obstetrics

## 2017-02-12 DIAGNOSIS — B3731 Acute candidiasis of vulva and vagina: Secondary | ICD-10-CM

## 2017-02-12 DIAGNOSIS — B373 Candidiasis of vulva and vagina: Secondary | ICD-10-CM

## 2017-02-12 MED ORDER — FLUCONAZOLE 150 MG PO TABS: 150.0000 mg | ORAL_TABLET | Freq: Once | ORAL | 0 refills | Status: AC

## 2017-02-12 NOTE — Telephone Encounter (Signed)
Patient notified of Rx.  

## 2017-02-12 NOTE — Telephone Encounter (Signed)
-----   Message from Brock Bad, MD sent at 02/12/2017  8:30 AM EDT ----- Diflucan Rx for yeast

## 2017-03-26 ENCOUNTER — Telehealth: Payer: Self-pay | Admitting: *Deleted

## 2017-03-26 ENCOUNTER — Other Ambulatory Visit: Payer: Self-pay | Admitting: Obstetrics

## 2017-03-26 DIAGNOSIS — B373 Candidiasis of vulva and vagina: Secondary | ICD-10-CM

## 2017-03-26 DIAGNOSIS — B3731 Acute candidiasis of vulva and vagina: Secondary | ICD-10-CM

## 2017-03-26 MED ORDER — FLUCONAZOLE 150 MG PO TABS: 150.0000 mg | ORAL_TABLET | Freq: Once | ORAL | 0 refills | Status: AC

## 2017-03-26 NOTE — Telephone Encounter (Signed)
Diflucan Rx for yeast 

## 2017-03-26 NOTE — Telephone Encounter (Signed)
Pt called and stated the needed something sent to the pharmacy for a yeast infection, please advise.Marland KitchenMarland Kitchen

## 2017-04-07 ENCOUNTER — Ambulatory Visit (HOSPITAL_COMMUNITY)
Admission: EM | Admit: 2017-04-07 | Discharge: 2017-04-07 | Disposition: A | Discharge status: Home / Self Care | Payer: Medicaid Other | Attending: Family Medicine | Admitting: Family Medicine

## 2017-04-07 ENCOUNTER — Encounter (HOSPITAL_COMMUNITY): Payer: Self-pay | Admitting: Emergency Medicine

## 2017-04-07 DIAGNOSIS — R519 Headache, unspecified: Secondary | ICD-10-CM

## 2017-04-07 DIAGNOSIS — R51 Headache: Secondary | ICD-10-CM | POA: Diagnosis not present

## 2017-04-07 DIAGNOSIS — M62830 Muscle spasm of back: Secondary | ICD-10-CM

## 2017-04-07 DIAGNOSIS — T148XXA Other injury of unspecified body region, initial encounter: Secondary | ICD-10-CM

## 2017-04-07 MED ORDER — NAPROXEN 500 MG PO TABS: 500.0000 mg | ORAL_TABLET | Freq: Two times a day (BID) | ORAL | 0 refills | Status: AC

## 2017-04-07 MED ORDER — KETOROLAC TROMETHAMINE 30 MG/ML IJ SOLN
30.0000 mg | Freq: Once | INTRAMUSCULAR | Status: AC
  Administered 2017-04-07: 30 mg via INTRAMUSCULAR

## 2017-04-07 MED ORDER — KETOROLAC TROMETHAMINE 30 MG/ML IJ SOLN
INTRAMUSCULAR | Status: AC
  Filled 2017-04-07: qty 1
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
## 2017-04-07 NOTE — ED Triage Notes (Signed)
Pt c/o back spasms and bad migraines since last week. Pt c/o "losing feeling in her right hand". That comes and goes. Pt has full sensation at this time.

## 2017-04-07 NOTE — Discharge Instructions (Signed)
No alarming signs on exam. Toradol injection today in office. Start Naproxen as directed. Ice/heat compresses as needed. This can take up to 3-4 weeks to completely resolve, but you should be feeling better each week. Follow up here or with PCP if symptoms worsen, changes for reevaluation. If experience numbness/tingling of the inner thighs, loss of bladder or bowel control, go to the emergency department for evaluation.

## 2017-05-25 ENCOUNTER — Encounter (HOSPITAL_COMMUNITY): Payer: Self-pay

## 2017-05-25 ENCOUNTER — Inpatient Hospital Stay (HOSPITAL_COMMUNITY)
Admission: AD | Admit: 2017-05-25 | Discharge: 2017-05-25 | Disposition: A | Discharge status: Home / Self Care | Payer: Medicaid Other | Source: Ambulatory Visit | Attending: Family Medicine | Admitting: Family Medicine

## 2017-05-25 ENCOUNTER — Other Ambulatory Visit: Payer: Self-pay

## 2017-05-25 DIAGNOSIS — N939 Abnormal uterine and vaginal bleeding, unspecified: Secondary | ICD-10-CM | POA: Diagnosis present

## 2017-05-25 DIAGNOSIS — Z79899 Other long term (current) drug therapy: Secondary | ICD-10-CM | POA: Diagnosis not present

## 2017-05-25 DIAGNOSIS — N946 Dysmenorrhea, unspecified: Secondary | ICD-10-CM | POA: Insufficient documentation

## 2017-05-25 LAB — CBC
HCT: 38.8 % (ref 36.0–46.0)
Hemoglobin: 12.7 g/dL (ref 12.0–15.0)
MCH: 26.7 pg (ref 26.0–34.0)
MCHC: 32.7 g/dL (ref 30.0–36.0)
MCV: 81.5 fL (ref 78.0–100.0)
PLATELETS: 292 10*3/uL (ref 150–400)
RBC: 4.76 MIL/uL (ref 3.87–5.11)
RDW: 13 % (ref 11.5–15.5)
WBC: 7 10*3/uL (ref 4.0–10.5)

## 2017-05-25 LAB — URINALYSIS, ROUTINE W REFLEX MICROSCOPIC

## 2017-05-25 LAB — POCT PREGNANCY, URINE: Preg Test, Ur: NEGATIVE

## 2017-05-25 LAB — WET PREP, GENITAL
SPERM: NONE SEEN
TRICH WET PREP: NONE SEEN
WBC, Wet Prep HPF POC: NONE SEEN
Yeast Wet Prep HPF POC: NONE SEEN

## 2017-05-25 LAB — URINALYSIS, MICROSCOPIC (REFLEX)

## 2017-05-25 LAB — HCG, QUANTITATIVE, PREGNANCY: hCG, Beta Chain, Quant, S: <1 m[IU]/mL (ref <5)

## 2017-05-25 MED ORDER — NORGESTIMATE-ETH ESTRADIOL 0.25-35 MG-MCG PO TABS: 1.0000 | ORAL_TABLET | Freq: Every day | ORAL | 2 refills | Status: DC

## 2017-05-25 NOTE — MAU Provider Note (Signed)
History     CSN: [id number]  Arrival date and time: 05/25/17 1454   First Provider Initiated Contact with Patient 05/25/17 1555      Chief Complaint  Patient presents with  . Vaginal Bleeding   HPI Judy [name] 21 y.o.  Comes to MAU with heavy vaginal bleeding.  Last menses was in October.  Did one home pregnancy test that was positive and another that was unequivical.  She was planning to wait and do another pregnancy test in 2 weeks but this morning, she began having heavy vaginal bleeding with lower abdominal pain.  [facility] coming in and now has some pain in her mid chest.  Her history is significant for a miscarriage in July 2018.  She was using the patch but it was "burning" her arm so she stopped it.  Does not have another appointment scheduled in the office.  HIV and RPR were negative in July 2018.   OB History    Gravida Para Term Preterm AB Living   4 3 1 2 1 3    SAB TAB Ectopic Multiple Live Births   1 0 0 0 3      Past Medical History:  Diagnosis Date  . Asthma   . History of preterm delivery   . Post partum depression   . Yeast infection of the vagina     Past Surgical History:  Procedure Laterality Date  . NO PAST SURGERIES      Family History  Problem Relation Age of Onset  . Hypertension Mother   . Diabetes Mother   . Asthma Father     Social History   Tobacco Use  . Smoking status: Never Smoker  . Smokeless tobacco: Never Used  Substance Use Topics  . Alcohol use: No  . Drug use: No    Allergies:  Allergies  Allergen Reactions  . Latex Dermatitis  . Tape Rash    Medications Prior to Admission  Medication Sig Dispense Refill Last Dose  . albuterol (PROVENTIL HFA;VENTOLIN HFA) 108 (90 BASE) MCG/ACT inhaler Inhale 2 puffs into the lungs every 6 (six) hours as needed for wheezing or shortness of breath.   Taking  . Prenatal Vit-Fe Phos-FA-Omega (VITAFOL GUMMIES) 3.33-0.333-34.8 MG CHEW Chew 3 each by  mouth daily. 90 tablet 11     Review of Systems  Constitutional: Negative for fever.  Respiratory: Negative for chest tightness and shortness of breath.   Cardiovascular: Negative for palpitations and leg swelling.       States her chest is hurting and places her hand on her sternum.  Gastrointestinal: Positive for abdominal pain. Negative for nausea and vomiting.  Genitourinary: Positive for vaginal bleeding. Negative for dysuria and vaginal discharge.   Physical Exam   Blood pressure (!) 112/44, pulse 80, temperature 98.2 F (36.8 C), temperature source Oral, resp. rate 20, height 5\' 2"  (1.575 m), weight 130 lb 4 oz (59.1 kg), last menstrual period 03/31/2017, SpO2 100 %, unknown if currently breastfeeding.  Physical Exam  Nursing note and vitals reviewed. Constitutional: She is oriented to person, place, and time. She appears well-developed and well-nourished.  HENT:  Head: Normocephalic.  Eyes: EOM are normal.  Neck: Neck supple.  Cardiovascular: Normal rate, regular rhythm and normal heart sounds.  No murmur heard. Respiratory: Effort normal and breath sounds normal.  GI: Soft. There is tenderness. There is no rebound and no guarding.  Genitourinary:  Genitourinary Comments: Speculum exam: Vagina - Moderate amount  of dark blood, no clots in the vagina Cervix - small amount of active bleeding noted Bimanual exam: Cervix closed Uterus tender, normal size Adnexa non tender, no masses bilaterally GC/Chlam, wet prep done Chaperone present for exam.   Musculoskeletal: Normal range of motion.  Neurological: She is alert and oriented to person, place, and time.  Skin: Skin is warm and dry.  Psychiatric: She has a normal mood and affect.    MAU Course  Procedures Results for orders placed or performed during the hospital encounter of 05/25/17 (from the past 24 hour(s))  Urinalysis, Routine w reflex microscopic     Status: Abnormal   Collection Time: 05/25/17  3:05 PM   Result Value Ref Range   Color, Urine RED (A) YELLOW   APPearance CLOUDY (A) CLEAR   Specific Gravity, Urine  1.005 - 1.030    TEST NOT REPORTED DUE TO COLOR INTERFERENCE OF URINE PIGMENT   pH  5.0 - 8.0    TEST NOT REPORTED DUE TO COLOR INTERFERENCE OF URINE PIGMENT   Glucose, UA (A) NEGATIVE mg/dL    TEST NOT REPORTED DUE TO COLOR INTERFERENCE OF URINE PIGMENT   Hgb urine dipstick (A) NEGATIVE    TEST NOT REPORTED DUE TO COLOR INTERFERENCE OF URINE PIGMENT   Bilirubin Urine (A) NEGATIVE    TEST NOT REPORTED DUE TO COLOR INTERFERENCE OF URINE PIGMENT   Ketones, ur (A) NEGATIVE mg/dL    TEST NOT REPORTED DUE TO COLOR INTERFERENCE OF URINE PIGMENT   Protein, ur (A) NEGATIVE mg/dL    TEST NOT REPORTED DUE TO COLOR INTERFERENCE OF URINE PIGMENT   Nitrite (A) NEGATIVE    TEST NOT REPORTED DUE TO COLOR INTERFERENCE OF URINE PIGMENT   Leukocytes, UA (A) NEGATIVE    TEST NOT REPORTED DUE TO COLOR INTERFERENCE OF URINE PIGMENT  Urinalysis, Microscopic (reflex)     Status: Abnormal   Collection Time: 05/25/17  3:05 PM  Result Value Ref Range   RBC / HPF TOO NUMEROUS TO COUNT 0 - 5 RBC/hpf   WBC, UA 0-5 0 - 5 WBC/hpf   Bacteria, UA FEW (A) NONE SEEN   Squamous Epithelial / LPF 0-5 (A) NONE SEEN  Pregnancy, urine POC     Status: None   Collection Time: 05/25/17  3:27 PM  Result Value Ref Range   Preg Test, Ur NEGATIVE NEGATIVE  Wet prep, genital     Status: Abnormal   Collection Time: 05/25/17  3:43 PM  Result Value Ref Range   Yeast Wet Prep HPF POC NONE SEEN NONE SEEN   Trich, Wet Prep NONE SEEN NONE SEEN   Clue Cells Wet Prep HPF POC PRESENT (A) NONE SEEN   WBC, Wet Prep HPF POC NONE SEEN NONE SEEN   Sperm NONE SEEN   CBC     Status: None   Collection Time: 05/25/17  4:09 PM  Result Value Ref Range   WBC 7.0 4.0 - 10.5 K/uL   RBC 4.76 3.87 - 5.11 MIL/uL   Hemoglobin 12.7 12.0 - 15.0 g/dL   HCT 16.1 09.6 - 04.5 %   MCV 81.5 78.0 - 100.0 fL   MCH 26.7 26.0 - 34.0 pg   MCHC  32.7 30.0 - 36.0 g/dL   RDW 40.9 81.1 - 91.4 %   Platelets 292 150 - 400 K/uL  hCG, quantitative, pregnancy     Status: None   Collection Time: 05/25/17  4:09 PM  Result Value Ref Range   hCG, Beta  Chain, Quant, S <1 <5 mIU/mL    MDM Pregnancy test by urine and by quant is negative today.  Encouraged client to be seen in the office to be continued on a method of contraception.  Desires to take ibuprofen that she has at home for pain today.  Assessment and Plan  Dysmenorrhea  Plan Can take over the counter ibuprofen and tylenol - can take both as long as you take them by the package directions to control your pain. Condoms always for intercourse as pregnancy protection and protection from sexually transmitted infections. See also the instructions on the AVS given to client.  Khia Dieterich L Giavanni Zeitlin 05/25/2017, 3:55 PM

## 2017-05-25 NOTE — Discharge Instructions (Signed)
Take ibuprofen by the package directions for pain. Get your pills from the pharmacy and begin taking one pill per day as soon as you get them. The pills will help your vaginal bleeding decrease. Condoms always along with the pills for pregnancy protection. Make an appointment in the office to be able to continue the pills. Condoms always for protection from sexually transmitted infections.

## 2017-05-25 NOTE — MAU Note (Signed)
Pt states she took two pregnancy test two weeks ago one was positive while the other was invalid. She states that she was going to wait a couple of weeks and take another test. However, she started bleeding before she was able to take another test. The bleeding started early this morning light pink in color and then progressed to heavy and dark red with clots. She states she is having intermittent abdominal pain 7/10.

## 2017-05-26 LAB — GC/CHLAMYDIA PROBE AMP (~~LOC~~) NOT AT ARMC
CHLAMYDIA, DNA PROBE: NEGATIVE
Neisseria Gonorrhea: NEGATIVE

## 2017-06-18 IMAGING — US US MFM OB TRANSVAGINAL
1 series · 15 of 28 positions shown · non-contrast
Comparison: none

[Series 1: us mfm ob transvaginal · 15 of 32 slices shown]
[im 1/32]
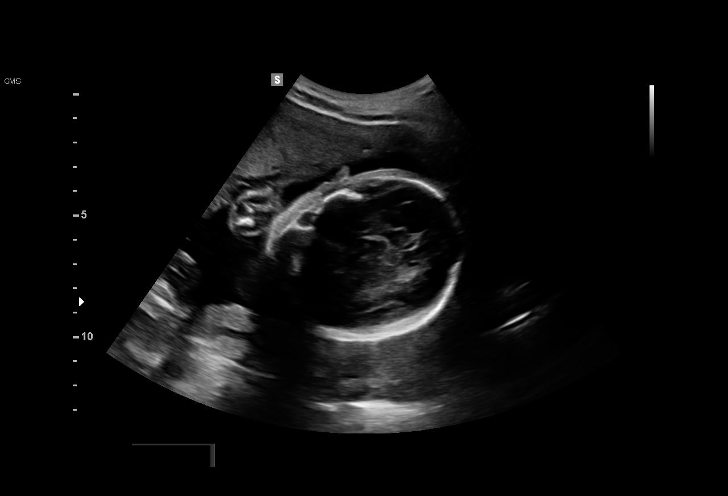
[im 3/32]
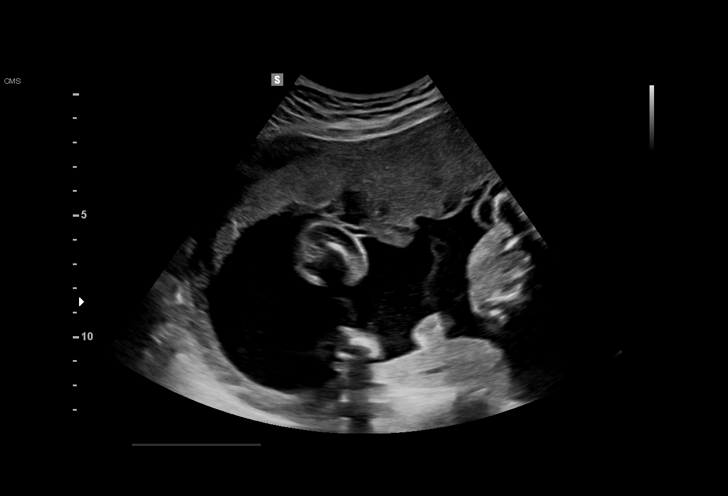
[im 5/32]
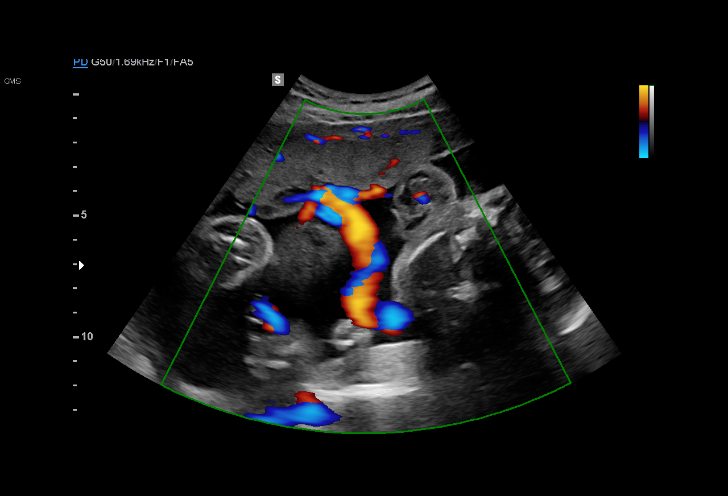
[im 7/32]
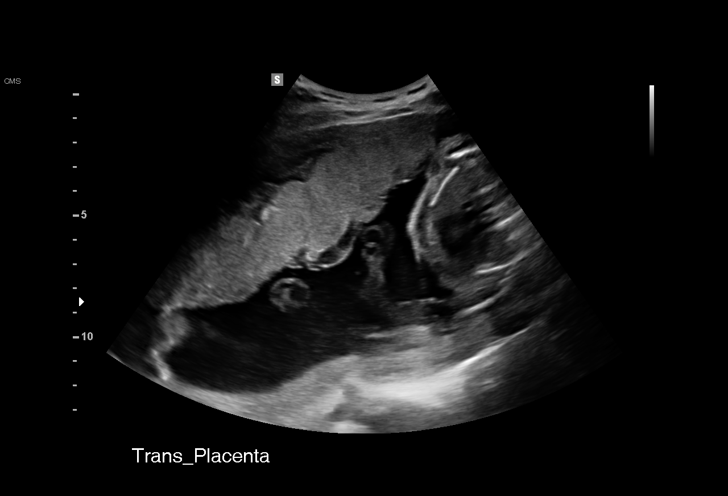
[im 10/32]
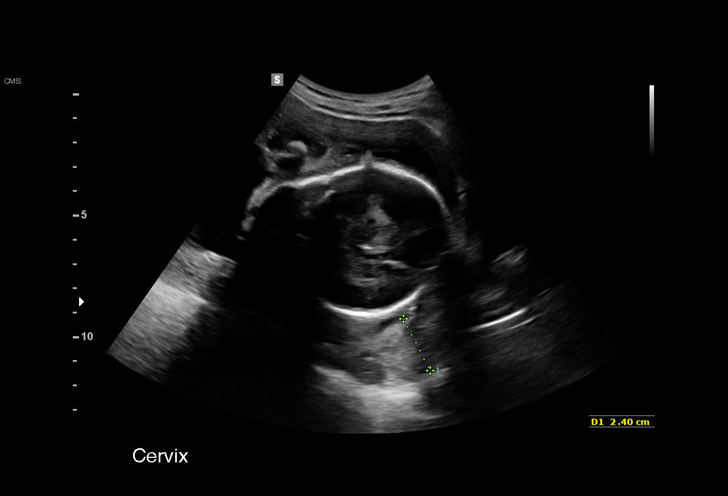
[im 12/32]
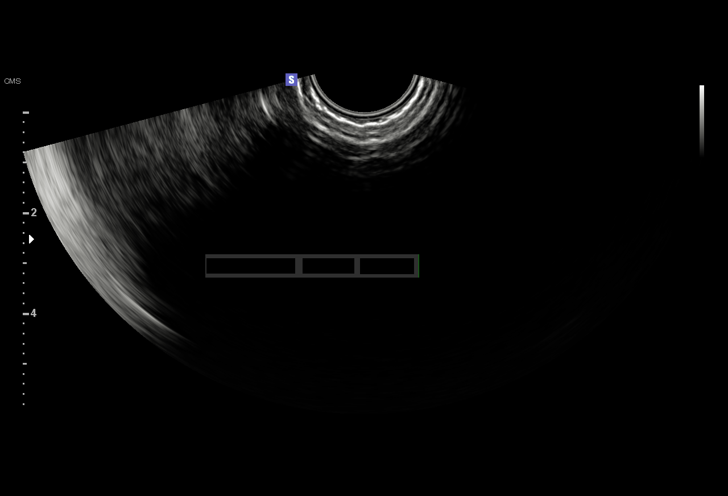
[im 14/32]
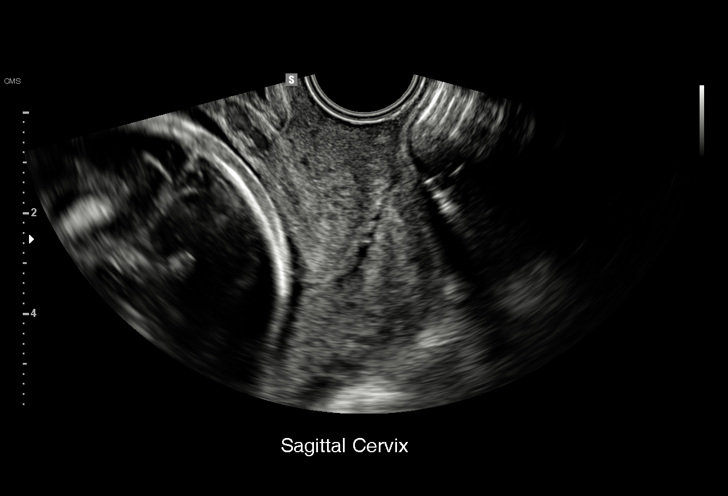
[im 17/32]
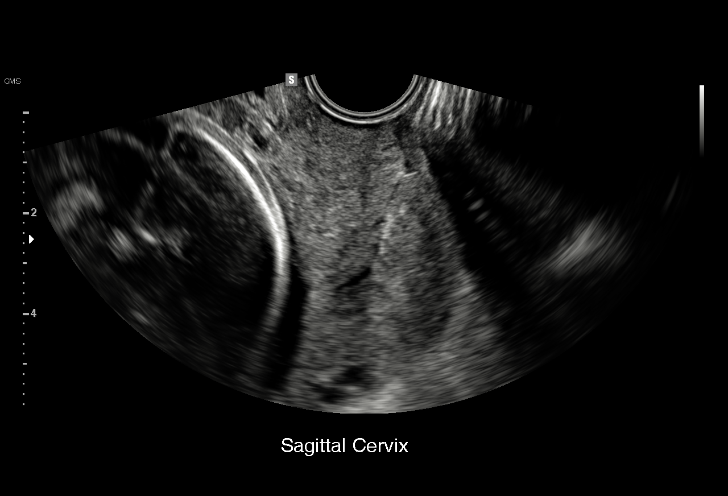
[im 18/32]
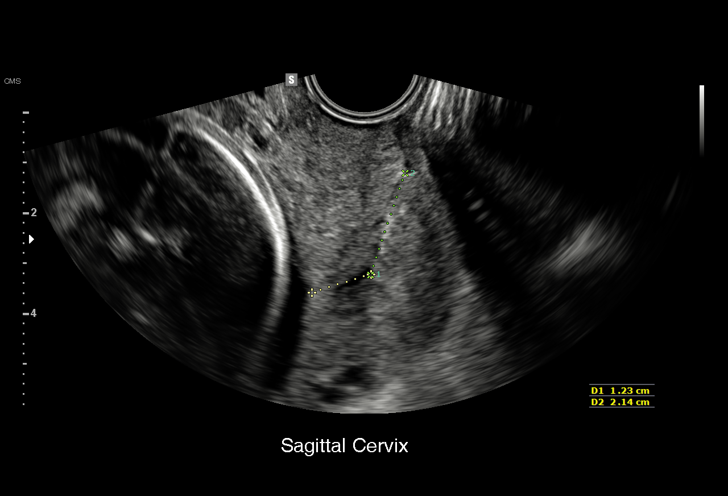
[im 20/32]
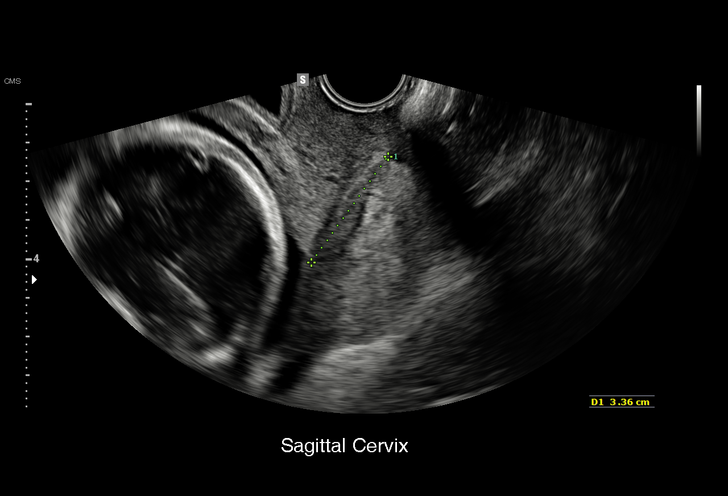
[im 22/32]
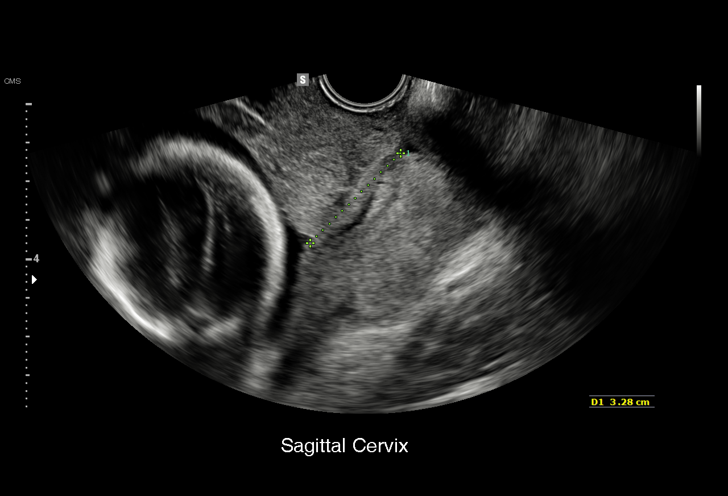
[im 25/32]
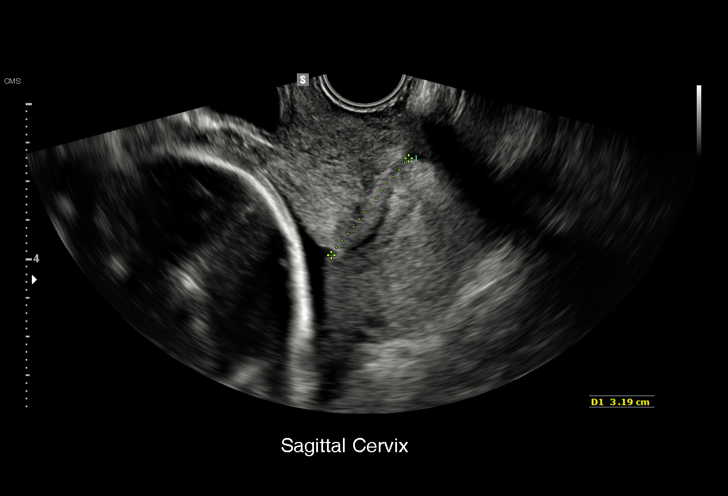
[im 27/32]
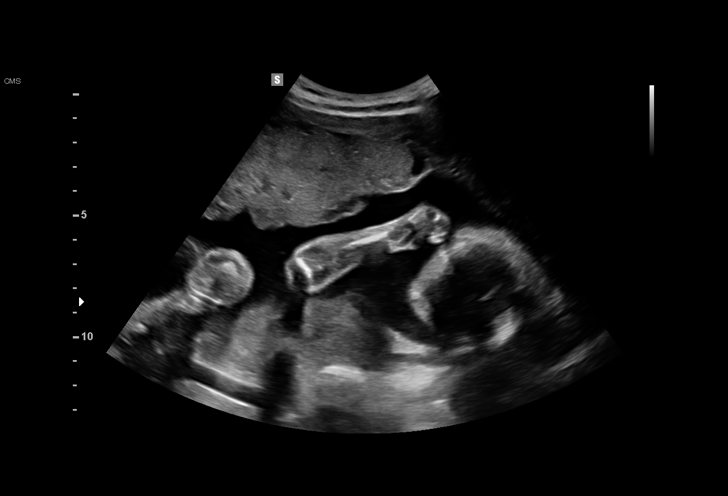
[im 29/32]
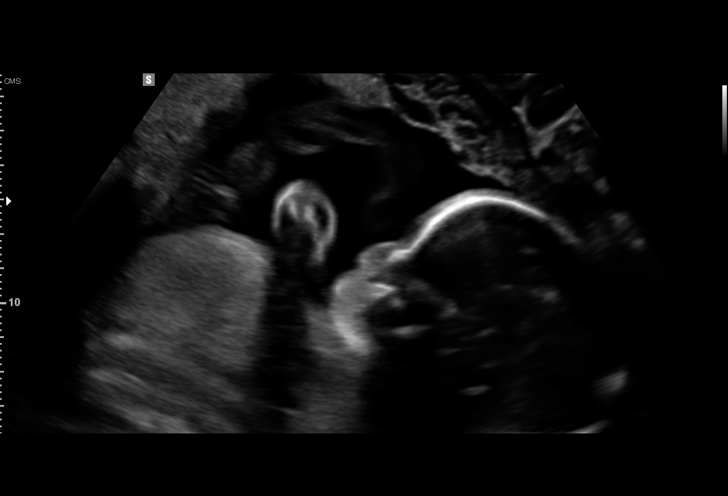
[im 32/32]
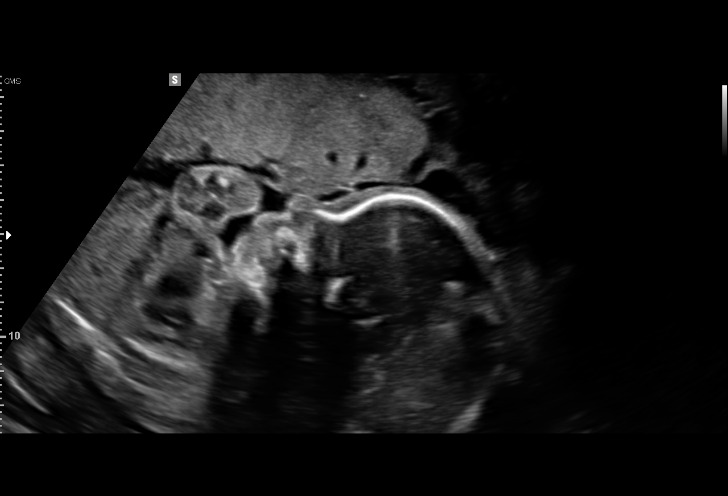

[15 of 28 positions shown; findings below may reference images not displayed]

Indications

24 weeks gestation of pregnancy
Abdominal pain in pregnancy
OB History

Gravidity:    3         Term:   1        Prem:   1        SAB:   0
TOP:          0       Ectopic:  0        Living: 2
Fetal Evaluation

Num Of Fetuses:     1
Fetal Heart         142
Rate(bpm):
Cardiac Activity:   Observed
Presentation:       Cephalic
Placenta:           Anterior, above cervical os
P. Cord Insertion:  Visualized, central

Amniotic Fluid
AFI FV:      Subjectively within normal limits

Largest Pocket(cm)
6.53
Gestational Age

LMP:           28w 2d       Date:   07/02/15                 EDD:   04/07/16
Best:          24w 6d    Det. By:   Early Ultrasound         EDD:   05/01/16
(10/28/15)
Cervix Uterus Adnexa

Cervix
Length:            3.3  cm.
Measured transvaginally.

Uterus
No abnormality visualized.

Left Ovary
Not visualized.

Right Ovary
Not visualized.

Cul De Sac:   No free fluid seen.

Adnexa:       No abnormality visualized.
Impression

Single IUP at 24w 6d
Limited ultrasound performed due to abdominal pain
Anterior placenta without previa.  No sonographic findings
suggestive of abruption noted.

TVUS - cervical length 3.3 cm without funneling or dynamic
changes
Recommendations

Follow-up ultrasounds as clinically indicated.

## 2017-07-02 ENCOUNTER — Ambulatory Visit: Payer: Medicaid Other | Admitting: Advanced Practice Midwife

## 2017-08-03 ENCOUNTER — Other Ambulatory Visit (HOSPITAL_COMMUNITY)
Admission: RE | Admit: 2017-08-03 | Discharge: 2017-08-03 | Disposition: A | Discharge status: Home / Self Care | Payer: Medicaid Other | Source: Ambulatory Visit | Attending: Obstetrics | Admitting: Obstetrics

## 2017-08-03 ENCOUNTER — Ambulatory Visit: Payer: Medicaid Other | Admitting: Obstetrics

## 2017-08-03 ENCOUNTER — Encounter: Payer: Self-pay | Admitting: Obstetrics

## 2017-08-03 VITALS — BP 111/70 | HR 116 | Ht 62.5 in | Wt 129.0 lb

## 2017-08-03 DIAGNOSIS — Z01419 Encounter for gynecological examination (general) (routine) without abnormal findings: Secondary | ICD-10-CM | POA: Diagnosis present

## 2017-08-03 DIAGNOSIS — Z0001 Encounter for general adult medical examination with abnormal findings: Secondary | ICD-10-CM | POA: Diagnosis not present

## 2017-08-03 DIAGNOSIS — Z113 Encounter for screening for infections with a predominantly sexual mode of transmission: Secondary | ICD-10-CM | POA: Diagnosis not present

## 2017-08-03 DIAGNOSIS — N870 Mild cervical dysplasia: Secondary | ICD-10-CM | POA: Insufficient documentation

## 2017-08-03 DIAGNOSIS — G43519 Persistent migraine aura without cerebral infarction, intractable, without status migrainosus: Secondary | ICD-10-CM | POA: Diagnosis not present

## 2017-08-03 MED ORDER — BUTALBITAL-APAP-CAFFEINE 50-325-40 MG PO TABS: 2.0000 | ORAL_TABLET | Freq: Four times a day (QID) | ORAL | 2 refills | Status: DC | PRN

## 2017-08-03 NOTE — Progress Notes (Signed)
Subjective:        Judy [name] is a 22 y.o. female here for a routine exam.  Current complaints: Migraine HA.  Two periods last month.    Personal health questionnaire:  Is patient [name], have a family history of breast and/or ovarian cancer: no Is there a family history of uterine cancer diagnosed at age < 6350, gastrointestinal cancer, urinary tract cancer, family member who is a Personnel officerLynch syndrome-associated carrier: no Is the patient overweight and hypertensive, family history of diabetes, personal history of gestational diabetes, preeclampsia or PCOS: no Is patient over 5555, have PCOS,  family history of premature CHD under age 22, diabetes, smoke, have hypertension or peripheral artery disease:  no At any time, has a partner hit, kicked or otherwise hurt or frightened you?: no Over the past 2 weeks, have you felt down, depressed or hopeless?: no Over the past 2 weeks, have you felt little interest or pleasure in doing things?:no   Gynecologic History Patient's last menstrual period was 06/24/2017 (approximate). Contraception: none Last Pap: none. Results were: none Last mammogram: none. Results were: none  Obstetric History OB History  Gravida Para Term Preterm AB Living  4 3 1 2 1 3   SAB TAB Ectopic Multiple Live Births  1 0 0 0 3    # Outcome Date GA Lbr Len/2nd Weight Sex Delivery Anes PTL Lv  4 Preterm 04/02/16 4888w6d 07:50 / 00:09 5 lb 13 oz (2.635 kg) F Vag-Spont EPI  LIV     Birth Comments: none  3 Preterm 08/10/12 133w6d 09:42 / 00:24 6 lb 4.7 oz (2.855 kg) M Vag-Spont EPI  LIV  2 Term 09/12/11 7068w4d 08:02 / 00:30 6 lb 1.4 oz (2.761 kg) F Vag-Spont EPI  LIV     Birth Comments: WNL  1 SAB               Past Medical History:  Diagnosis Date  . Asthma   . History of preterm delivery   . Post partum depression   . Yeast infection of the vagina     Past Surgical History:  Procedure Laterality Date  . NO PAST SURGERIES       Current Outpatient  Medications:  .  albuterol (PROVENTIL HFA;VENTOLIN HFA) 108 (90 BASE) MCG/ACT inhaler, Inhale 2 puffs into the lungs every 6 (six) hours as needed for wheezing or shortness of breath., Disp: , Rfl:  .  ibuprofen (ADVIL,MOTRIN) 800 MG tablet, Take 800 mg by mouth every 6 (six) hours as needed for moderate pain., Disp: , Rfl:  .  norgestimate-ethinyl estradiol (SPRINTEC 28) 0.25-35 MG-MCG tablet, Take 1 tablet by mouth daily. (Patient not taking: Reported on 08/03/2017), Disp: 1 Package, Rfl: 2 .  Prenatal Vit-Fe Phos-FA-Omega (VITAFOL GUMMIES) 3.33-0.333-34.8 MG CHEW, Chew 3 each by mouth daily. (Patient not taking: Reported on 08/03/2017), Disp: 90 tablet, Rfl: 11 Allergies  Allergen Reactions  . Latex Dermatitis  . Tape Rash    Social History   Tobacco Use  . Smoking status: Never Smoker  . Smokeless tobacco: Never Used  Substance Use Topics  . Alcohol use: No    Family History  Problem Relation Age of Onset  . Hypertension Mother   . Diabetes Mother   . Asthma Father       Review of Systems  Constitutional: negative for fatigue and weight loss Respiratory: negative for cough and wheezing Cardiovascular: negative for chest pain, fatigue and palpitations Gastrointestinal: negative for abdominal pain and change  in bowel habits Musculoskeletal:negative for myalgias Neurological: positive for Migraines Behavioral/Psych: negative for abusive relationship, depression Endocrine: negative for temperature intolerance    Genitourinary:positive for 2 periods last month.  Negative for genital lesions, hot flashes, sexual problems and vaginal discharge Integument/breast: negative for breast lump, breast tenderness, nipple discharge and skin lesion(s)    Objective:       BP 111/70   Pulse (!) 116   Ht 5' 2.5" (1.588 m)   Wt 129 lb (58.5 kg)   LMP 06/24/2017 (Approximate)   Breastfeeding? No   BMI 23.22 kg/m  General:   alert  Skin:   no rash or abnormalities  Lungs:   clear to  auscultation bilaterally  Heart:   regular rate and rhythm, S1, S2 normal, no murmur, click, rub or gallop  Breasts:   normal without suspicious masses, skin or nipple changes or axillary nodes  Abdomen:  normal findings: no organomegaly, soft, non-tender and no hernia  Pelvis:  External genitalia: normal general appearance Urinary system: urethral meatus normal and bladder without fullness, nontender Vaginal: normal without tenderness, induration or masses Cervix: normal appearance Adnexa: normal bimanual exam Uterus: anteverted and non-tender, normal size   Lab Review Urine pregnancy test Labs reviewed yes Radiologic studies reviewed no  50% of 20 min visit spent on counseling and coordination of care.   Assessment and Plan     1. Encounter for routine gynecological examination with Papanicolaou smear of cervix Rx: - Cytology - PAP  2. Screening for STD (sexually transmitted disease) Rx: - Hepatitis B surface antigen - Hepatitis C antibody - HIV antibody - RPR - Cervicovaginal ancillary only  3. Migraine aura, persistent, intractable Rx: - butalbital-acetaminophen-caffeine (FIORICET, ESGIC) 50-325-40 MG tablet; Take 2 tablets by mouth every 6 (six) hours as needed for headache.  Dispense: 40 tablet; Refill: 2 - Ambulatory referral to Neurology    Plan:    Follow up in 1 year  No orders of the defined types were placed in this encounter.  No orders of the defined types were placed in this encounter.   Brock Bad MD

## 2017-08-03 NOTE — Progress Notes (Signed)
LMP: 06/24/2017 per pt had cycle twice last month.  Mammogram:N/A  Last Pap:N/A Colonoscopy:N/A  Contraception:None PCP: None   STD Screening: Desires All Patient came on period twice last month. Pt had miscarriage July 2019. Had No F/U. C/O" Migraines

## 2017-08-04 LAB — HIV ANTIBODY (ROUTINE TESTING W REFLEX): HIV SCREEN 4TH GENERATION: NONREACTIVE

## 2017-08-04 LAB — CERVICOVAGINAL ANCILLARY ONLY
Bacterial vaginitis: NEGATIVE
CANDIDA VAGINITIS: NEGATIVE
Chlamydia: NEGATIVE
Neisseria Gonorrhea: NEGATIVE
Trichomonas: NEGATIVE

## 2017-08-04 LAB — HEPATITIS B SURFACE ANTIGEN: Hepatitis B Surface Ag: NEGATIVE

## 2017-08-04 LAB — RPR: RPR Ser Ql: NONREACTIVE

## 2017-08-04 LAB — HEPATITIS C ANTIBODY

## 2017-08-05 ENCOUNTER — Other Ambulatory Visit: Payer: Self-pay | Admitting: Obstetrics

## 2017-08-05 LAB — CYTOLOGY - PAP

## 2017-08-27 ENCOUNTER — Other Ambulatory Visit (HOSPITAL_COMMUNITY)
Admission: RE | Admit: 2017-08-27 | Discharge: 2017-08-27 | Disposition: A | Discharge status: Home / Self Care | Payer: Medicaid Other | Source: Ambulatory Visit | Attending: Obstetrics | Admitting: Obstetrics

## 2017-08-27 ENCOUNTER — Encounter: Payer: Self-pay | Admitting: Obstetrics

## 2017-08-27 ENCOUNTER — Ambulatory Visit (INDEPENDENT_AMBULATORY_CARE_PROVIDER_SITE_OTHER): Payer: Medicaid Other | Admitting: Obstetrics

## 2017-08-27 ENCOUNTER — Encounter: Payer: Self-pay | Admitting: *Deleted

## 2017-08-27 VITALS — BP 109/68 | HR 73 | Wt 128.7 lb

## 2017-08-27 DIAGNOSIS — N871 Moderate cervical dysplasia: Secondary | ICD-10-CM

## 2017-08-27 DIAGNOSIS — Z3202 Encounter for pregnancy test, result negative: Secondary | ICD-10-CM

## 2017-08-27 DIAGNOSIS — R87612 Low grade squamous intraepithelial lesion on cytologic smear of cervix (LGSIL): Secondary | ICD-10-CM

## 2017-08-27 DIAGNOSIS — N898 Other specified noninflammatory disorders of vagina: Secondary | ICD-10-CM | POA: Diagnosis present

## 2017-08-27 NOTE — Progress Notes (Signed)
    GYNECOLOGY CLINIC COLPOSCOPY PROCEDURE NOTE  22 y.o. [id number] here for colposcopy for low-grade squamous intraepithelial neoplasia (LGSIL - encompassing HPV,mild dysplasia,CIN I) pap smear on 08-03-2017. Discussed role for HPV in cervical dysplasia, need for surveillance.  Patient given informed consent, signed copy in the chart, time out was performed.  Placed in lithotomy position. Cervix viewed with speculum and colposcope after application of acetic acid.   Colposcopy adequate? Yes  acetowhite lesion(s) noted at 6 and 3 o'clock; corresponding biopsies obtained.  ECC specimen obtained. All specimens were labeled and sent to pathology.   Patient was given post procedure instructions.  Will follow up pathology and manage accordingly; patient will be contacted with results and recommendations.  Routine preventative health maintenance measures emphasized.    Brock BadHARLES A. Volney Reierson , MD, FACOG Attending Obstetrician & Gynecologist, [facility] [place], [facility]

## 2017-08-27 NOTE — Progress Notes (Signed)
Presents for COLPO - LOW GRADE SQUAMOUS INTRAEPITHELIAL LESION: CIN-1/ HPV (LSIL). UPT today is NEGATIVE.  Had unprotected sex 2 days ago.

## 2017-08-30 LAB — CERVICOVAGINAL ANCILLARY ONLY
BACTERIAL VAGINITIS: POSITIVE — AB
Candida vaginitis: NEGATIVE
Chlamydia: NEGATIVE
Neisseria Gonorrhea: NEGATIVE
TRICH (WINDOWPATH): NEGATIVE

## 2017-08-31 ENCOUNTER — Other Ambulatory Visit: Payer: Self-pay | Admitting: Obstetrics

## 2017-08-31 ENCOUNTER — Encounter: Payer: Self-pay | Admitting: *Deleted

## 2017-09-10 ENCOUNTER — Ambulatory Visit: Payer: Medicaid Other | Admitting: Obstetrics

## 2017-09-22 ENCOUNTER — Ambulatory Visit (INDEPENDENT_AMBULATORY_CARE_PROVIDER_SITE_OTHER): Payer: Medicaid Other | Admitting: Obstetrics

## 2017-09-22 ENCOUNTER — Encounter: Payer: Self-pay | Admitting: Obstetrics

## 2017-09-22 VITALS — BP 110/56 | HR 86 | Temp 99.6°F | Resp 16 | Wt 131.9 lb

## 2017-09-22 DIAGNOSIS — N871 Moderate cervical dysplasia: Secondary | ICD-10-CM | POA: Diagnosis not present

## 2017-09-22 NOTE — Progress Notes (Signed)
Patient ID: Judy [name], female   DOB: [birth year], [age] y.o.   MRN: [id number]  Chief Complaint  Patient presents with  . Colposcopy    Follow up     HPI Judy Silllisha M Favila is a 22 y.o. female.  Presents for colposcopy results.  H/O LGSIL pap smear. HPI  Past Medical History:  Diagnosis Date  . Asthma   . History of preterm delivery   . Post partum depression   . Yeast infection of the vagina     Past Surgical History:  Procedure Laterality Date  . NO PAST SURGERIES      Family History  Problem Relation Age of Onset  . Hypertension Mother   . Diabetes Mother   . Asthma Father     Social History Social History   Tobacco Use  . Smoking status: Never Smoker  . Smokeless tobacco: Never Used  Substance Use Topics  . Alcohol use: No  . Drug use: No    Allergies  Allergen Reactions  . Latex Dermatitis  . Tape Rash    Current Outpatient Medications  Medication Sig Dispense Refill  . albuterol (PROVENTIL HFA;VENTOLIN HFA) 108 (90 BASE) MCG/ACT inhaler Inhale 2 puffs into the lungs every 6 (six) hours as needed for wheezing or shortness of breath.    . butalbital-acetaminophen-caffeine (FIORICET, ESGIC) 50-325-40 MG tablet Take 2 tablets by mouth every 6 (six) hours as needed for headache. 40 tablet 2  . ibuprofen (ADVIL,MOTRIN) 800 MG tablet Take 800 mg by mouth every 6 (six) hours as needed for moderate pain.    . norgestimate-ethinyl estradiol (SPRINTEC 28) 0.25-35 MG-MCG tablet Take 1 tablet by mouth daily. (Patient not taking: Reported on 08/03/2017) 1 Package 2  . Prenatal Vit-Fe Phos-FA-Omega (VITAFOL GUMMIES) 3.33-0.333-34.8 MG CHEW Chew 3 each by mouth daily. (Patient not taking: Reported on 08/03/2017) 90 tablet 11   No current facility-administered medications for this visit.     Review of Systems Review of Systems Constitutional: negative for fatigue and weight loss Respiratory: negative for cough and wheezing Cardiovascular: negative for chest  pain, fatigue and palpitations Gastrointestinal: negative for abdominal pain and change in bowel habits Genitourinary:negative Integument/breast: negative for nipple discharge Musculoskeletal:negative for myalgias Neurological: negative for gait problems and tremors Behavioral/Psych: negative for abusive relationship, depression Endocrine: negative for temperature intolerance      Blood pressure (!) 110/56, pulse 86, temperature 99.6 F (37.6 C), temperature source Oral, resp. rate 16, weight 131 lb 14.4 oz (59.8 kg), not currently breastfeeding.  Physical Exam Physical Exam:  Deferred  >50% of 10 min visit spent on counseling and coordination of care.   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  No orders of the defined types were placed in this encounter.   Brock BadHARLES A. HARPER MD 09-22-2017

## 2017-10-18 ENCOUNTER — Ambulatory Visit (INDEPENDENT_AMBULATORY_CARE_PROVIDER_SITE_OTHER): Payer: Medicaid Other | Admitting: Obstetrics

## 2017-10-18 ENCOUNTER — Encounter: Payer: Self-pay | Admitting: *Deleted

## 2017-10-18 ENCOUNTER — Other Ambulatory Visit (HOSPITAL_COMMUNITY)
Admission: RE | Admit: 2017-10-18 | Discharge: 2017-10-18 | Disposition: A | Discharge status: Home / Self Care | Payer: Medicaid Other | Source: Ambulatory Visit | Attending: Obstetrics | Admitting: Obstetrics

## 2017-10-18 VITALS — BP 110/68 | HR 84 | Ht 62.5 in | Wt 130.0 lb

## 2017-10-18 DIAGNOSIS — N760 Acute vaginitis: Secondary | ICD-10-CM | POA: Insufficient documentation

## 2017-10-18 DIAGNOSIS — N871 Moderate cervical dysplasia: Secondary | ICD-10-CM | POA: Insufficient documentation

## 2017-10-18 DIAGNOSIS — B9689 Other specified bacterial agents as the cause of diseases classified elsewhere: Secondary | ICD-10-CM | POA: Diagnosis not present

## 2017-10-18 DIAGNOSIS — B373 Candidiasis of vulva and vagina: Secondary | ICD-10-CM | POA: Diagnosis not present

## 2017-10-18 DIAGNOSIS — N898 Other specified noninflammatory disorders of vagina: Secondary | ICD-10-CM

## 2017-10-18 LAB — POCT URINE PREGNANCY: PREG TEST UR: NEGATIVE

## 2017-10-18 NOTE — Progress Notes (Signed)
Pt is in the office for cryo procedure. Pt states last sexual IC over 2 weeks ago.

## 2017-10-19 ENCOUNTER — Encounter: Payer: Self-pay | Admitting: Obstetrics

## 2017-10-19 NOTE — Progress Notes (Signed)
    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 She was counseled about that efficacy of this procedure, and possible need for excisional procedure in the future if her cervical dysplasia persists.  The risks of the procedure where explained in detail and patient was told to expect a copious amount of discharge in the next few weeks. All her questions were answered, and written informed consent was obtained.  The patient was placed in the dorsal lithotomy position and a vaginal speculum was placed. Her cervix was visualized and patient was noted to have had normal size transformation zone. The appropriate cryotherapy probe was picked and affixed to cryotherapy apparatus. Then nitrogen gas was then activated, the probe was coated with lubricating jelly and applied to the transformation zone of the cervix. This was kept in place for 3 minutes. The cryotherapy was then stopped and all instruments were removed from the patient's pelvis; a thawing period of 3 minutes was observed.  A second cycle of cryotherapy was then administered to the cervix for 3 minutes.  The patient tolerated the procedure well without any complications. Routine post procedure instructions were given to the patient.  Will repeat pap smear in 6 months and manage accordingly.   Charles a. Everett Graff, MD, FACOG Obstetrician & Gynecologist, [facility] [employer], [facility] 10-18-2017

## 2017-10-20 ENCOUNTER — Other Ambulatory Visit: Payer: Self-pay | Admitting: Obstetrics

## 2017-10-20 DIAGNOSIS — B3731 Acute candidiasis of vulva and vagina: Secondary | ICD-10-CM

## 2017-10-20 DIAGNOSIS — B9689 Other specified bacterial agents as the cause of diseases classified elsewhere: Secondary | ICD-10-CM

## 2017-10-20 DIAGNOSIS — N760 Acute vaginitis: Secondary | ICD-10-CM

## 2017-10-20 DIAGNOSIS — B373 Candidiasis of vulva and vagina: Secondary | ICD-10-CM

## 2017-10-20 LAB — CERVICOVAGINAL ANCILLARY ONLY
Bacterial vaginitis: POSITIVE — AB
CANDIDA VAGINITIS: POSITIVE — AB
CHLAMYDIA, DNA PROBE: NEGATIVE
NEISSERIA GONORRHEA: NEGATIVE
TRICH (WINDOWPATH): NEGATIVE

## 2017-10-20 MED ORDER — SECNIDAZOLE 2 G PO PACK: 1.0000 | PACK | Freq: Once | ORAL | 2 refills | Status: AC

## 2017-10-20 MED ORDER — FLUCONAZOLE 150 MG PO TABS
150.0000 mg | ORAL_TABLET | Freq: Once | ORAL | 0 refills | Status: AC
Start: 2017-10-20 — End: 2017-10-20

## 2017-10-21 ENCOUNTER — Telehealth: Payer: Self-pay

## 2017-10-21 NOTE — Telephone Encounter (Signed)
Pt made aware of results and that Rx has been sent . Pt voiced understanding.

## 2017-10-22 ENCOUNTER — Telehealth: Payer: Self-pay

## 2017-10-22 NOTE — Telephone Encounter (Signed)
TC to pt regarding Rx change for BV pt states medication made her sick.  I have called pt twice to confirm which  Rx I am assuming it was Solosec.  Please advise

## 2017-10-23 NOTE — Telephone Encounter (Signed)
Continue to contact patient

## 2017-11-01 ENCOUNTER — Ambulatory Visit: Payer: Medicaid Other | Admitting: Obstetrics

## 2017-11-17 ENCOUNTER — Ambulatory Visit: Payer: Medicaid Other | Admitting: Obstetrics

## 2017-11-18 ENCOUNTER — Telehealth: Payer: Self-pay

## 2017-11-18 NOTE — Telephone Encounter (Signed)
Pt c/o vaginal irritation and thick white discharge. She requests BV and yeast medication. Pt does not like Solosec.

## 2017-11-19 MED ORDER — METRONIDAZOLE 500 MG PO TABS: 500.0000 mg | ORAL_TABLET | Freq: Two times a day (BID) | ORAL | 0 refills | Status: DC

## 2017-11-19 MED ORDER — FLUCONAZOLE 150 MG PO TABS: 150.0000 mg | ORAL_TABLET | Freq: Once | ORAL | 0 refills | Status: AC

## 2017-11-19 NOTE — Telephone Encounter (Signed)
After consulting with provider, pt made aware Metronidazole and Diflucan has been approved.

## 2018-04-06 ENCOUNTER — Other Ambulatory Visit: Payer: Self-pay

## 2018-04-06 ENCOUNTER — Encounter (HOSPITAL_COMMUNITY): Payer: Self-pay | Admitting: *Deleted

## 2018-04-06 ENCOUNTER — Inpatient Hospital Stay (HOSPITAL_COMMUNITY)
Admission: AD | Admit: 2018-04-06 | Discharge: 2018-04-06 | Disposition: A | Discharge status: Home / Self Care | Payer: Medicaid Other | Source: Ambulatory Visit | Attending: Family Medicine | Admitting: Family Medicine

## 2018-04-06 DIAGNOSIS — R102 Pelvic and perineal pain: Secondary | ICD-10-CM | POA: Diagnosis not present

## 2018-04-06 DIAGNOSIS — Z91040 Latex allergy status: Secondary | ICD-10-CM | POA: Diagnosis not present

## 2018-04-06 DIAGNOSIS — B373 Candidiasis of vulva and vagina: Secondary | ICD-10-CM | POA: Insufficient documentation

## 2018-04-06 DIAGNOSIS — Z833 Family history of diabetes mellitus: Secondary | ICD-10-CM | POA: Insufficient documentation

## 2018-04-06 DIAGNOSIS — Z888 Allergy status to other drugs, medicaments and biological substances status: Secondary | ICD-10-CM | POA: Insufficient documentation

## 2018-04-06 DIAGNOSIS — Z79899 Other long term (current) drug therapy: Secondary | ICD-10-CM | POA: Diagnosis not present

## 2018-04-06 DIAGNOSIS — Z792 Long term (current) use of antibiotics: Secondary | ICD-10-CM | POA: Insufficient documentation

## 2018-04-06 DIAGNOSIS — Z8249 Family history of ischemic heart disease and other diseases of the circulatory system: Secondary | ICD-10-CM | POA: Insufficient documentation

## 2018-04-06 DIAGNOSIS — R109 Unspecified abdominal pain: Secondary | ICD-10-CM | POA: Diagnosis present

## 2018-04-06 DIAGNOSIS — J45909 Unspecified asthma, uncomplicated: Secondary | ICD-10-CM | POA: Insufficient documentation

## 2018-04-06 DIAGNOSIS — B3731 Acute candidiasis of vulva and vagina: Secondary | ICD-10-CM

## 2018-04-06 LAB — URINALYSIS, ROUTINE W REFLEX MICROSCOPIC
Bilirubin Urine: NEGATIVE
GLUCOSE, UA: NEGATIVE mg/dL
Hgb urine dipstick: NEGATIVE
KETONES UR: NEGATIVE mg/dL
Nitrite: NEGATIVE
PROTEIN: NEGATIVE mg/dL
Specific Gravity, Urine: 1.023 (ref 1.005–1.030)
pH: 6 (ref 5.0–8.0)

## 2018-04-06 LAB — WET PREP, GENITAL
CLUE CELLS WET PREP: NONE SEEN
Sperm: NONE SEEN
Trich, Wet Prep: NONE SEEN

## 2018-04-06 MED ORDER — FLUCONAZOLE 150 MG PO TABS: 150.0000 mg | ORAL_TABLET | ORAL | 0 refills | Status: DC

## 2018-04-06 NOTE — MAU Provider Note (Signed)
History     CSN: [id number]  Arrival date and time: 04/06/18 1548   First Provider Initiated Contact with Patient 04/06/18 1720      Chief Complaint  Patient presents with  . Abdominal Pain   HPI This is a 22 year old G54 P1213 who presents with worsening right lower quadrant/pelvic pain that started 3 days ago.  She had a short period at that time which ended early.  Her pain started shortly after that.  Pain is nonradiating, fairly constant, and is rated 4 out of 10.  Has mild nausea but no fevers, chills, vomiting, diarrhea, constipation.  She is not currently using birth control.  She is sexually active.  No new partners.  OB History    Gravida  4   Para  3   Term  1   Preterm  2   AB  1   Living  3     SAB  1   TAB  0   Ectopic  0   Multiple  0   Live Births  3           Past Medical History:  Diagnosis Date  . Asthma   . History of preterm delivery   . Post partum depression   . Yeast infection of the vagina     Past Surgical History:  Procedure Laterality Date  . NO PAST SURGERIES      Family History  Problem Relation Age of Onset  . Hypertension Mother   . Diabetes Mother   . Asthma Father     Social History   Tobacco Use  . Smoking status: Never Smoker  . Smokeless tobacco: Never Used  Substance Use Topics  . Alcohol use: No  . Drug use: No    Allergies:  Allergies  Allergen Reactions  . Latex Dermatitis  . Tape Rash    Medications Prior to Admission  Medication Sig Dispense Refill Last Dose  . albuterol (PROVENTIL HFA;VENTOLIN HFA) 108 (90 BASE) MCG/ACT inhaler Inhale 2 puffs into the lungs every 6 (six) hours as needed for wheezing or shortness of breath.   Not Taking  . butalbital-acetaminophen-caffeine (FIORICET, ESGIC) 50-325-40 MG tablet Take 2 tablets by mouth every 6 (six) hours as needed for headache. (Patient not taking: Reported on 10/18/2017) 40 tablet 2 Not Taking  . ibuprofen (ADVIL,MOTRIN) 800 MG tablet Take  800 mg by mouth every 6 (six) hours as needed for moderate pain.   Not Taking  . metroNIDAZOLE (FLAGYL) 500 MG tablet Take 1 tablet (500 mg total) by mouth 2 (two) times daily. 14 tablet 0   . norgestimate-ethinyl estradiol (SPRINTEC 28) 0.25-35 MG-MCG tablet Take 1 tablet by mouth daily. (Patient not taking: Reported on 08/03/2017) 1 Package 2 Not Taking  . Prenatal Vit-Fe Phos-FA-Omega (VITAFOL GUMMIES) 3.33-0.333-34.8 MG CHEW Chew 3 each by mouth daily. (Patient not taking: Reported on 08/03/2017) 90 tablet 11 Not Taking    Review of Systems  All other systems reviewed and are negative.  Physical Exam   Blood pressure 118/61, pulse 78, temperature 98.4 F (36.9 C), temperature source Oral, resp. rate 16, weight 63.8 kg, last menstrual period 04/01/2018, SpO2 100 %.  Physical Exam  Constitutional: She is oriented to person, place, and time. She appears well-developed and well-nourished.  HENT:  Head: Normocephalic and atraumatic.  Right Ear: External ear normal.  Left Ear: External ear normal.  Neck: Normal range of motion. Neck supple.  Cardiovascular: Normal rate, regular rhythm and normal heart sounds.  Respiratory: Effort normal and breath sounds normal.  GI: Soft. Bowel sounds are normal. Hernia confirmed negative in the right inguinal area and confirmed negative in the left inguinal area.  Genitourinary: There is no rash, tenderness, lesion or injury on the right labia. There is no rash, tenderness, lesion or injury on the left labia. Uterus is tender. Uterus is not deviated, not enlarged and not fixed. Cervix exhibits no motion tenderness, no discharge and no friability. Right adnexum displays tenderness. Right adnexum displays no mass and no fullness. Left adnexum displays no mass, no tenderness and no fullness. There is tenderness in the vagina. No erythema or bleeding in the vagina. No foreign body in the vagina. No signs of injury around the vagina. Vaginal discharge (clumping  white discharge) found.  Lymphadenopathy:       Right: No inguinal adenopathy present.       Left: No inguinal adenopathy present.  Neurological: She is alert and oriented to person, place, and time.  Skin: Skin is warm and dry.  Psychiatric: She has a normal mood and affect. Her behavior is normal. Judgment and thought content normal.   Results for orders placed or performed during the hospital encounter of 04/06/18 (from the past 24 hour(s))  Urinalysis, Routine w reflex microscopic     Status: Abnormal   Collection Time: 04/06/18  4:24 PM  Result Value Ref Range   Color, Urine YELLOW YELLOW   APPearance CLOUDY (A) CLEAR   Specific Gravity, Urine 1.023 1.005 - 1.030   pH 6.0 5.0 - 8.0   Glucose, UA NEGATIVE NEGATIVE mg/dL   Hgb urine dipstick NEGATIVE NEGATIVE   Bilirubin Urine NEGATIVE NEGATIVE   Ketones, ur NEGATIVE NEGATIVE mg/dL   Protein, ur NEGATIVE NEGATIVE mg/dL   Nitrite NEGATIVE NEGATIVE   Leukocytes, UA LARGE (A) NEGATIVE   RBC / HPF 0-5 0 - 5 RBC/hpf   WBC, UA >50 (H) 0 - 5 WBC/hpf   Bacteria, UA FEW (A) NONE SEEN   Squamous Epithelial / LPF 21-50 0 - 5   Mucus PRESENT   Wet prep, genital     Status: Abnormal   Collection Time: 04/06/18  5:35 PM  Result Value Ref Range   Yeast Wet Prep HPF POC PRESENT (A) NONE SEEN   Trich, Wet Prep NONE SEEN NONE SEEN   Clue Cells Wet Prep HPF POC NONE SEEN NONE SEEN   WBC, Wet Prep HPF POC MANY (A) NONE SEEN   Sperm NONE SEEN      MAU Course  Procedures  MDM  Assessment and Plan     ICD-10-CM   1. Vulvovaginitis due to yeast B37.3    Diflucan 2 doses F/u as needed  Levie Heritage 04/06/2018, 5:34 PM

## 2018-04-06 NOTE — Discharge Instructions (Signed)

## 2018-04-06 NOTE — MAU Note (Signed)
For the last 2 days, has been having some abnormal pain.  Pain in lower abd.  Started bleeding on Friday, ended Monday morning, thought it was her period - but it was short and a little early.  Then the pain started.

## 2018-04-07 LAB — GC/CHLAMYDIA PROBE AMP (~~LOC~~) NOT AT ARMC
Chlamydia: NEGATIVE
Neisseria Gonorrhea: NEGATIVE

## 2018-05-01 ENCOUNTER — Emergency Department (HOSPITAL_COMMUNITY)
Admission: EM | Admit: 2018-05-01 | Discharge: 2018-05-01 | Disposition: A | Discharge status: Home / Self Care | Payer: Medicaid Other | Attending: Emergency Medicine | Admitting: Emergency Medicine

## 2018-05-01 ENCOUNTER — Other Ambulatory Visit: Payer: Self-pay

## 2018-05-01 ENCOUNTER — Encounter (HOSPITAL_COMMUNITY): Payer: Self-pay | Admitting: *Deleted

## 2018-05-01 DIAGNOSIS — K0889 Other specified disorders of teeth and supporting structures: Secondary | ICD-10-CM | POA: Diagnosis not present

## 2018-05-01 DIAGNOSIS — J45909 Unspecified asthma, uncomplicated: Secondary | ICD-10-CM | POA: Diagnosis not present

## 2018-05-01 MED ORDER — PENICILLIN V POTASSIUM 500 MG PO TABS: 500.0000 mg | ORAL_TABLET | Freq: Four times a day (QID) | ORAL | 0 refills | Status: AC

## 2018-05-01 MED ORDER — DEXAMETHASONE SODIUM PHOSPHATE 10 MG/ML IJ SOLN
10.0000 mg | Freq: Once | INTRAMUSCULAR | Status: AC
  Administered 2018-05-01: 10 mg via INTRAMUSCULAR
  Filled 2018-05-01: qty 1

## 2018-05-01 NOTE — Discharge Instructions (Signed)
You were given a prescription for antibiotics. Please take the antibiotic prescription fully.   Please follow-up with a dentist in the next 5 to 7 days for reevaluation.  If you do not have a dentist, resources were provided for dentist in the area in your discharge summary.  Please contact one of the offices that are listed and make an appointment for follow-up.  Please return to the emergency department for any new or worsening symptoms.  

## 2018-05-01 NOTE — ED Triage Notes (Signed)
Pt c/o left lower back dental pain.

## 2018-05-01 NOTE — ED Provider Notes (Signed)
[facility]-EMERGENCY DEPT Provider Note   CSN: [id number] Arrival date & time: 05/01/18  0320     History   Chief Complaint Chief Complaint  Patient presents with  . Dental Pain    HPI Judy [name] is a 22 y.o. female.  HPI  Patient is a 22 year old female with history of asthma, who presents the emergency department today for evaluation of left lower dental pain that has been ongoing over the last several days.  States she tried using Orajel and Advil at home with no significant relief.  Denies fevers.  States she does have a Education officer, community but has not tried to contact them to follow-up yet.  Pain is constant and severe in nature.  Past Medical History:  Diagnosis Date  . Asthma   . History of preterm delivery   . Post partum depression   . Yeast infection of the vagina     Patient Active Problem List   Diagnosis Date Noted  . Supervision of other normal pregnancy, antepartum 02/05/2016  . Excessive or frequent menstruation 11/09/2012  . Dysmenorrhea 11/01/2012  . Depressive disorder, not elsewhere classified 10/03/2012  . Subacute pyelonephritis 09/22/2012    Past Surgical History:  Procedure Laterality Date  . NO PAST SURGERIES       OB History    Gravida  4   Para  3   Term  1   Preterm  2   AB  1   Living  3     SAB  1   TAB  0   Ectopic  0   Multiple  0   Live Births  3            Home Medications    Prior to Admission medications   Medication Sig Start Date End Date Taking? Authorizing Provider  albuterol (PROVENTIL HFA;VENTOLIN HFA) 108 (90 BASE) MCG/ACT inhaler Inhale 2 puffs into the lungs every 6 (six) hours as needed for wheezing or shortness of breath.    [provider]  butalbital-acetaminophen-caffeine (FIORICET, ESGIC) 50-325-40 MG tablet Take 2 tablets by mouth every 6 (six) hours as needed for headache. Patient not taking: Reported on 10/18/2017 08/03/17 08/03/18  Brock Bad, MD    fluconazole (DIFLUCAN) 150 MG tablet Take 1 tablet (150 mg total) by mouth every 3 (three) days. 04/06/18   Levie Heritage, DO  ibuprofen (ADVIL,MOTRIN) 800 MG tablet Take 800 mg by mouth every 6 (six) hours as needed for moderate pain.    [provider]  metroNIDAZOLE (FLAGYL) 500 MG tablet Take 1 tablet (500 mg total) by mouth 2 (two) times daily. 11/19/17   Brock Bad, MD  norgestimate-ethinyl estradiol (SPRINTEC 28) 0.25-35 MG-MCG tablet Take 1 tablet by mouth daily. Patient not taking: Reported on 08/03/2017 05/25/17   Currie Paris, NP  penicillin v potassium (VEETID) 500 MG tablet Take 1 tablet (500 mg total) by mouth 4 (four) times daily for 7 days. 05/01/18 05/08/18  Kerianne Gurr S, PA-C  Prenatal Vit-Fe Phos-FA-Omega (VITAFOL GUMMIES) 3.33-0.333-34.8 MG CHEW Chew 3 each by mouth daily. Patient not taking: Reported on 08/03/2017 02/10/17   Brock Bad, MD    Family History Family History  Problem Relation Age of Onset  . Hypertension Mother   . Diabetes Mother   . Asthma Father     Social History Social History   Tobacco Use  . Smoking status: Never Smoker  . Smokeless tobacco: Never Used  Substance Use Topics  .  Alcohol use: No  . Drug use: Yes    Types: Marijuana     Allergies   Latex and Tape   Review of Systems Review of Systems  Constitutional: Negative for fever.  HENT: Positive for dental problem.      Physical Exam Updated Vital Signs BP (!) 106/54 (BP Location: Left Arm)   Pulse 70   Temp 98.5 F (36.9 C) (Oral)   Resp 16   Ht 5\' 2"  (1.575 m)   Wt 70.8 kg   LMP 04/28/2018 (Exact Date)   SpO2 100%   BMI 28.53 kg/m   Physical Exam  Constitutional: She appears well-developed and well-nourished. No distress.  HENT:  Head: Normocephalic and atraumatic.  No pharyngeal erythema, tonsillar swelling or exudates.  The wisdom tooth on the left side is partially growing in.  No obvious periapical abscess.  Patient has pain  with opening her mouth.  No swelling beneath the tongue.  There is mild swelling to the left side of the face and there is tender to palpation to the left mandible.  Eyes: Conjunctivae are normal.  Neck: Neck supple.  No significant swelling to the neck.  There is mild cervical lymphadenopathy.  Cardiovascular: Normal rate.  Pulmonary/Chest: Effort normal.  Musculoskeletal: Normal range of motion.  Neurological: She is alert.  Skin: Skin is warm and dry.  Psychiatric: She has a normal mood and affect.  Nursing note and vitals reviewed.   ED Treatments / Results  Labs (all labs ordered are listed, but only abnormal results are displayed) Labs Reviewed - No data to display  EKG None  Radiology No results found.  Procedures Procedures (including critical care time)  Medications Ordered in ED Medications  dexamethasone (DECADRON) injection 10 mg (has no administration in time range)     Initial Impression / Assessment and Plan / ED Course  I have reviewed the triage vital signs and the nursing notes.  Pertinent labs & imaging results that were available during my care of the patient were reviewed by me and considered in my medical decision making (see chart for details).    Final Clinical Impressions(s) / ED Diagnoses   Final diagnoses:  Pain, dental   Patient with toothache.  No gross abscess.  Exam unconcerning for Ludwig's angina or spread of infection. Dr. Adela Lank evaluated pt and he did not appreciate trismus and suspects that pt has impacted wisdom tooth. Will treat with penicillin and pain medicine. Decadron given in ED. Urged patient to follow-up with dentist.     ED Discharge Orders         Ordered    penicillin v potassium (VEETID) 500 MG tablet  4 times daily     05/01/18 0410           Kahlin Mark S, PA-C 05/01/18 0411    Melene Plan, DO 05/01/18 [phone]

## 2018-05-30 ENCOUNTER — Inpatient Hospital Stay (HOSPITAL_COMMUNITY): Payer: Medicaid Other

## 2018-05-30 ENCOUNTER — Encounter (HOSPITAL_COMMUNITY): Payer: Self-pay | Admitting: *Deleted

## 2018-05-30 ENCOUNTER — Other Ambulatory Visit: Payer: Self-pay

## 2018-05-30 ENCOUNTER — Ambulatory Visit: Payer: Self-pay | Admitting: Obstetrics

## 2018-05-30 ENCOUNTER — Inpatient Hospital Stay (HOSPITAL_COMMUNITY)
Admission: AD | Admit: 2018-05-30 | Discharge: 2018-05-30 | Disposition: A | Discharge status: Home / Self Care | Payer: Medicaid Other | Attending: Obstetrics & Gynecology | Admitting: Obstetrics & Gynecology

## 2018-05-30 DIAGNOSIS — O23591 Infection of other part of genital tract in pregnancy, first trimester: Secondary | ICD-10-CM | POA: Diagnosis not present

## 2018-05-30 DIAGNOSIS — R1030 Lower abdominal pain, unspecified: Secondary | ICD-10-CM | POA: Diagnosis present

## 2018-05-30 DIAGNOSIS — B9689 Other specified bacterial agents as the cause of diseases classified elsewhere: Secondary | ICD-10-CM

## 2018-05-30 DIAGNOSIS — B373 Candidiasis of vulva and vagina: Secondary | ICD-10-CM | POA: Diagnosis not present

## 2018-05-30 DIAGNOSIS — Z880 Allergy status to penicillin: Secondary | ICD-10-CM | POA: Insufficient documentation

## 2018-05-30 DIAGNOSIS — O26891 Other specified pregnancy related conditions, first trimester: Secondary | ICD-10-CM

## 2018-05-30 DIAGNOSIS — N760 Acute vaginitis: Secondary | ICD-10-CM

## 2018-05-30 DIAGNOSIS — O98811 Other maternal infectious and parasitic diseases complicating pregnancy, first trimester: Secondary | ICD-10-CM | POA: Diagnosis not present

## 2018-05-30 DIAGNOSIS — R109 Unspecified abdominal pain: Secondary | ICD-10-CM

## 2018-05-30 DIAGNOSIS — R87619 Unspecified abnormal cytological findings in specimens from cervix uteri: Secondary | ICD-10-CM | POA: Insufficient documentation

## 2018-05-30 DIAGNOSIS — Z3A01 Less than 8 weeks gestation of pregnancy: Secondary | ICD-10-CM

## 2018-05-30 DIAGNOSIS — O3680X0 Pregnancy with inconclusive fetal viability, not applicable or unspecified: Secondary | ICD-10-CM

## 2018-05-30 DIAGNOSIS — O26899 Other specified pregnancy related conditions, unspecified trimester: Secondary | ICD-10-CM

## 2018-05-30 LAB — CBC
HEMATOCRIT: 37.8 % (ref 36.0–46.0)
HEMOGLOBIN: 12.9 g/dL (ref 12.0–15.0)
MCH: 27.3 pg (ref 26.0–34.0)
MCHC: 34.1 g/dL (ref 30.0–36.0)
MCV: 80.1 fL (ref 80.0–100.0)
Platelets: 300 10*3/uL (ref 150–400)
RBC: 4.72 MIL/uL (ref 3.87–5.11)
RDW: 12.7 % (ref 11.5–15.5)
WBC: 7.1 10*3/uL (ref 4.0–10.5)
nRBC: 0 % (ref 0.0–0.2)

## 2018-05-30 LAB — URINALYSIS, ROUTINE W REFLEX MICROSCOPIC
Bilirubin Urine: NEGATIVE
Glucose, UA: NEGATIVE mg/dL
HGB URINE DIPSTICK: NEGATIVE
Ketones, ur: 5 mg/dL — AB
NITRITE: NEGATIVE
PH: 6 (ref 5.0–8.0)
PROTEIN: NEGATIVE mg/dL
Specific Gravity, Urine: 1.016 (ref 1.005–1.030)

## 2018-05-30 LAB — HCG, QUANTITATIVE, PREGNANCY: HCG, BETA CHAIN, QUANT, S: 174 m[IU]/mL — AB (ref <5)

## 2018-05-30 LAB — WET PREP, GENITAL
SPERM: NONE SEEN
TRICH WET PREP: NONE SEEN
Yeast Wet Prep HPF POC: NONE SEEN

## 2018-05-30 LAB — POCT PREGNANCY, URINE: Preg Test, Ur: POSITIVE — AB

## 2018-05-30 MED ORDER — METRONIDAZOLE 500 MG PO TABS: 500.0000 mg | ORAL_TABLET | Freq: Two times a day (BID) | ORAL | 0 refills | Status: DC

## 2018-05-30 NOTE — Discharge Instructions (Signed)
°  Return to Maternity Admissions for repeat lab work on Saturday morning at 8 am.  This appoitnment will take 2 hours to get results. Return to MAU with severe vaginal bleeding or severe abdominal pain.  Advised to return with worsening symptoms as an ectopic pregnancy cannot be ruled out at this time and this could be a life threatening condition. Pelvic rest - no tampons, no douching, no sex, nothing in the vagina. No heavy lifting or strenuous exercise. You can get medication for a yeast infection over the counter at the store - do not use vagisil.  Use a 7 day cream - it is not as strong.

## 2018-05-30 NOTE — MAU Provider Note (Signed)
History     CSN: [id number]  Arrival date and time: 05/30/18 1725   First Provider Initiated Contact with Patient 05/30/18 1824      Chief Complaint  Patient presents with  . Abdominal Pain   HPI Judy [name] 22 y.o. [redacted]w[redacted]d  Comes to MAU with lower abdominal cramping that has worsened over the past few days.  Had a positive home pregnancy test.  Had an appointment scheduled at [facility] today but was not able to go as she was working.  So came to MAU for evaluation.  Needs to reapply for Medicaid.  OB History    Gravida  5   Para  3   Term  1   Preterm  2   AB  1   Living  3     SAB  1   TAB  0   Ectopic  0   Multiple  0   Live Births  3           Past Medical History:  Diagnosis Date  . Asthma   . History of preterm delivery   . Post partum depression   . Yeast infection of the vagina     Past Surgical History:  Procedure Laterality Date  . NO PAST SURGERIES      Family History  Problem Relation Age of Onset  . Hypertension Mother   . Diabetes Mother   . Asthma Father     Social History   Tobacco Use  . Smoking status: Never Smoker  . Smokeless tobacco: Never Used  Substance Use Topics  . Alcohol use: No  . Drug use: Not Currently    Allergies:  Allergies  Allergen Reactions  . Latex Dermatitis  . Penicillins Hives  . Tape Rash    Medications Prior to Admission  Medication Sig Dispense Refill Last Dose  . albuterol (PROVENTIL HFA;VENTOLIN HFA) 108 (90 BASE) MCG/ACT inhaler Inhale 2 puffs into the lungs every 6 (six) hours as needed for wheezing or shortness of breath.   Not Taking  . butalbital-acetaminophen-caffeine (FIORICET, ESGIC) 50-325-40 MG tablet Take 2 tablets by mouth every 6 (six) hours as needed for headache. (Patient not taking: Reported on 10/18/2017) 40 tablet 2 Not Taking  . fluconazole (DIFLUCAN) 150 MG tablet Take 1 tablet (150 mg total) by mouth every 3 (three) days. 2 tablet 0   . ibuprofen (ADVIL,MOTRIN)  800 MG tablet Take 800 mg by mouth every 6 (six) hours as needed for moderate pain.   Not Taking  . metroNIDAZOLE (FLAGYL) 500 MG tablet Take 1 tablet (500 mg total) by mouth 2 (two) times daily. 14 tablet 0   . norgestimate-ethinyl estradiol (SPRINTEC 28) 0.25-35 MG-MCG tablet Take 1 tablet by mouth daily. (Patient not taking: Reported on 08/03/2017) 1 Package 2 Not Taking  . Prenatal Vit-Fe Phos-FA-Omega (VITAFOL GUMMIES) 3.33-0.333-34.8 MG CHEW Chew 3 each by mouth daily. (Patient not taking: Reported on 08/03/2017) 90 tablet 11 Not Taking    Review of Systems  Constitutional: Negative for fever.  Gastrointestinal: Positive for abdominal pain. Negative for nausea and vomiting.  Genitourinary: Negative for dysuria, vaginal bleeding and vaginal discharge.   Physical Exam   Blood pressure (!) 122/50, pulse 83, temperature 98.5 F (36.9 C), temperature source Oral, resp. rate 18, height 5\' 2"  (1.575 m), weight 64.5 kg, last menstrual period 04/24/2018, SpO2 98 %.  Physical Exam  Nursing note and vitals reviewed. Constitutional: She is oriented to person, place, and time. She appears well-developed  and well-nourished.  HENT:  Head: Normocephalic.  Eyes: EOM are normal.  Neck: Neck supple.  GI: Soft. There is no tenderness. There is no rebound and no guarding.  Genitourinary:  Genitourinary Comments: Speculum exam: Vulva - white discharge noted on labia minora Vagina - Small amount of creamy discharge coating the cervix and vaginal walls, no odor Cervix - No contact bleeding Bimanual exam: Cervix closed Uterus non tender, normal size Adnexa non tender, no masses bilaterally GC/Chlam, wet prep done Chaperone present for exam.   Musculoskeletal: Normal range of motion.  Neurological: She is alert and oriented to person, place, and time.  Skin: Skin is warm and dry.  Psychiatric: She has a normal mood and affect.    MAU Course  Procedures Results for orders placed or performed  during the hospital encounter of 05/30/18 (from the past 24 hour(s))  Urinalysis, Routine w reflex microscopic     Status: Abnormal   Collection Time: 05/30/18  5:34 PM  Result Value Ref Range   Color, Urine YELLOW YELLOW   APPearance CLOUDY (A) CLEAR   Specific Gravity, Urine 1.016 1.005 - 1.030   pH 6.0 5.0 - 8.0   Glucose, UA NEGATIVE NEGATIVE mg/dL   Hgb urine dipstick NEGATIVE NEGATIVE   Bilirubin Urine NEGATIVE NEGATIVE   Ketones, ur 5 (A) NEGATIVE mg/dL   Protein, ur NEGATIVE NEGATIVE mg/dL   Nitrite NEGATIVE NEGATIVE   Leukocytes, UA SMALL (A) NEGATIVE   RBC / HPF 0-5 0 - 5 RBC/hpf   WBC, UA 6-10 0 - 5 WBC/hpf   Bacteria, UA RARE (A) NONE SEEN   Squamous Epithelial / LPF 21-50 0 - 5   Mucus PRESENT   Pregnancy, urine POC     Status: Abnormal   Collection Time: 05/30/18  6:19 PM  Result Value Ref Range   Preg Test, Ur POSITIVE (A) NEGATIVE  CBC     Status: None   Collection Time: 05/30/18  6:32 PM  Result Value Ref Range   WBC 7.1 4.0 - 10.5 K/uL   RBC 4.72 3.87 - 5.11 MIL/uL   Hemoglobin 12.9 12.0 - 15.0 g/dL   HCT 40.937.8 81.136.0 - 91.446.0 %   MCV 80.1 80.0 - 100.0 fL   MCH 27.3 26.0 - 34.0 pg   MCHC 34.1 30.0 - 36.0 g/dL   RDW 78.212.7 95.611.5 - 21.315.5 %   Platelets 300 150 - 400 K/uL   nRBC 0.0 0.0 - 0.2 %  hCG, quantitative, pregnancy     Status: Abnormal   Collection Time: 05/30/18  6:32 PM  Result Value Ref Range   hCG, Beta Chain, Quant, S 174 (H) <5 mIU/mL  Wet prep, genital     Status: Abnormal   Collection Time: 05/30/18  6:47 PM  Result Value Ref Range   Yeast Wet Prep HPF POC NONE SEEN NONE SEEN   Trich, Wet Prep NONE SEEN NONE SEEN   Clue Cells Wet Prep HPF POC PRESENT (A) NONE SEEN   WBC, Wet Prep HPF POC MANY (A) NONE SEEN   Sperm NONE SEEN     MDM Classic appearance of BV externally and internally - will treat.  Patient informed that at this time the pregnancy cannot be confirmed to be in the uterus as no yolk sac is visualized on ultrasound.  Advised to  return with worsening symptoms as an ectopic pregnancy cannot be ruled out at this time and this could be a life threatening condition.   Assessment and Plan  Pregnancy of unknown anatomic location Bacterial vaginosis Yeast infection  Plan Return to MAU with severe vaginal bleeding or severe abdominal pain.  Pelvic rest - no tampons, no douching, no sex, nothing in the vagina. No heavy lifting or strenuous exercise. Will treat with Metronidazole and OTC yeast cream - Monistat or Gynezol as she does not currently have any insurance. Will repeat stat BHCG in 48 hours  - will come on Saturday at 8 AM to MAU for repeat labs. Stressed importance of client to come to appointment.    L  05/30/2018, 8:30 PM

## 2018-05-30 NOTE — MAU Note (Signed)
Urine in lab 

## 2018-05-30 NOTE — MAU Note (Signed)
Presents with c/o lower abdominal pain that began 2 days ago but has worsened.  +HPT yesterday.  LMP 04/24/2018

## 2018-05-31 LAB — RPR: RPR: NONREACTIVE

## 2018-05-31 LAB — CERVICOVAGINAL ANCILLARY ONLY
CHLAMYDIA, DNA PROBE: NEGATIVE
Neisseria Gonorrhea: NEGATIVE

## 2018-06-04 ENCOUNTER — Inpatient Hospital Stay (HOSPITAL_COMMUNITY)
Admission: AD | Admit: 2018-06-04 | Discharge: 2018-06-04 | Disposition: A | Discharge status: Home / Self Care | Payer: Medicaid Other | Attending: Family Medicine | Admitting: Family Medicine

## 2018-06-04 DIAGNOSIS — Z3A01 Less than 8 weeks gestation of pregnancy: Secondary | ICD-10-CM

## 2018-06-04 DIAGNOSIS — O26891 Other specified pregnancy related conditions, first trimester: Secondary | ICD-10-CM | POA: Insufficient documentation

## 2018-06-04 DIAGNOSIS — O3680X0 Pregnancy with inconclusive fetal viability, not applicable or unspecified: Secondary | ICD-10-CM | POA: Diagnosis present

## 2018-06-04 LAB — HCG, QUANTITATIVE, PREGNANCY: HCG, BETA CHAIN, QUANT, S: 1354 m[IU]/mL — AB (ref <5)

## 2018-06-04 MED ORDER — METRONIDAZOLE 0.75 % VA GEL: 1.0000 | Freq: Every day | VAGINAL | 0 refills | Status: AC

## 2018-06-04 MED ORDER — METRONIDAZOLE 500 MG PO TABS: 500.0000 mg | ORAL_TABLET | Freq: Two times a day (BID) | ORAL | 0 refills | Status: DC

## 2018-06-04 NOTE — MAU Note (Signed)
Judy [name] is a 22 y.o. at 199w6d here in MAU reporting: for repeat HCG. Per Bartholome Bill. Burrleson NP's note on 12/9 patient needed the above stated labs. Pain score: 5/10; lower intermittent abdominal cramping; states that it is the same as her previous visit. Vaginal bleeding: Denies Vitals:   06/04/18 0843  BP: (!) 103/59  Pulse: 84  Resp: 17  Temp: 98.3 F (36.8 C)  SpO2: 99%     Lab orders placed from triage: HCG Patient to lobby to await labs and results.

## 2018-06-04 NOTE — MAU Provider Note (Signed)
History   Chief Complaint:  Follow-up   Judy [name] is  22 y.o. [id number] Patient's last menstrual period was 04/24/2018 (exact date).. Patient is here for follow up of quantitative HCG and ongoing surveillance of pregnancy status. She is 3238w6d weeks gestation by LMP.    Since her last visit, the patient is without new complaint. The patient reports bleeding as  none now.  She does still have the same abdominal cramping she had when she was last seen. She reports not picking up Rx for Flagyl, because "Medicaid is not straighten out."  General ROS:  positive for abdominal cramping  Her previous Quantitative HCG values are:     Physical Exam   Blood pressure (!) 103/59, pulse 84, temperature 98.3 F (36.8 C), temperature source Oral, resp. rate 17, weight 64.9 kg, last menstrual period 04/24/2018, SpO2 99 %.  Focused Gynecological Exam: normal external genitalia, vulva, vagina, cervix, uterus and adnexa, examination not indicated  Labs: Results for orders placed or performed during the hospital encounter of 06/04/18 (from the past 24 hour(s))  hCG, quantitative, pregnancy   Collection Time: 06/04/18  8:49 AM  Result Value Ref Range   hCG, Beta Chain, Quant, S 1,354 (H) <5 mIU/mL    Ultrasound Studies:   [place] Ob Comp Less 14 Wks  Result Date: 05/30/2018 CLINICAL DATA:  22 y/o F; 2 days of lower abdominal pain. Quantitative beta HCG 174. EXAM: OBSTETRIC <14 WK US AND TRANSVAGINAL OB US TECHNIQUE: Both transabdominal and transvaginal ultrasound examinations were performed for complete evaluation of the gestation as well as the maternal uterus, adnexal regions, and pelvic cul-de-sac. Transvaginal technique was performed to assess early pregnancy. COMPARISON:  01/03/2017 pelvic ultrasound. FINDINGS: Intrauterine gestational sac: None Yolk sac:  Not Visualized. Embryo:  Not Visualized. Cardiac Activity: Not Visualized. Subchorionic hemorrhage:  None visualized. Maternal uterus/adnexae:  Left ovary measures 3.0 x 1.5 x 1.5 cm. Right ovary measures 3.3 x 3.4 x 2.3 cm. Right ovary 2 cm cyst with peripheral hyperemia compatible with corpus luteum. No adnexal mass. Small volume of simple free fluid in the pelvis, likely physiologic. Endometrium measures up to 12 mm in thickness. IMPRESSION: 1. Pregnancy of unknown location. Intrauterine pregnancy may not be identified with a beta HCG of less than 3,000. Follow-up beta HCG and possible pelvic ultrasound as indicated is recommended to evaluate pregnancy location. This recommendation follows SRU consensus guidelines: Diagnostic Criteria for Nonviable Pregnancy Early in the First Trimester. Malva Limes Engl J Med 2013; 130:8657-84; 369:1443-51. 2. No acute process identified. Electronically Signed   By: Mitzi HansenLance  Furusawa-Stratton M.D. On: 05/30/2018 20:47   Assessment: 5338w6d weeks gestation  By LMP Pregnancy of Unknown Location Appropriate rise in HCG   Plan: The patient is instructed to follow up in in 10 days for repeat U/S.  Raelyn Moraolitta Gimena Buick, MSN, CNM 06/04/2018, 10:25 AM

## 2018-06-06 IMAGING — US US OB COMP LESS 14 WK
1 series · 15 of 28 positions shown · non-contrast
Comparison: None.

CLINICAL DATA: Vaginal bleeding and cramping. Gestational age by
last menstrual period 4 weeks and 6 days. Beta HCG 293.

EXAM:
OBSTETRIC <14 WK US AND TRANSVAGINAL OB US
TECHNIQUE: Both transabdominal and transvaginal ultrasound examinations were
performed for complete evaluation of the gestation as well as the
with the parent decidual reaction. Maternal uterus, adnexal regions,
and pelvic cul-de-sac. Transvaginal technique was performed to
assess early pregnancy.

[Series 1: us ob comp less 14 wk · 41 acquisitions, 15 frames shown]
[im 1/41]
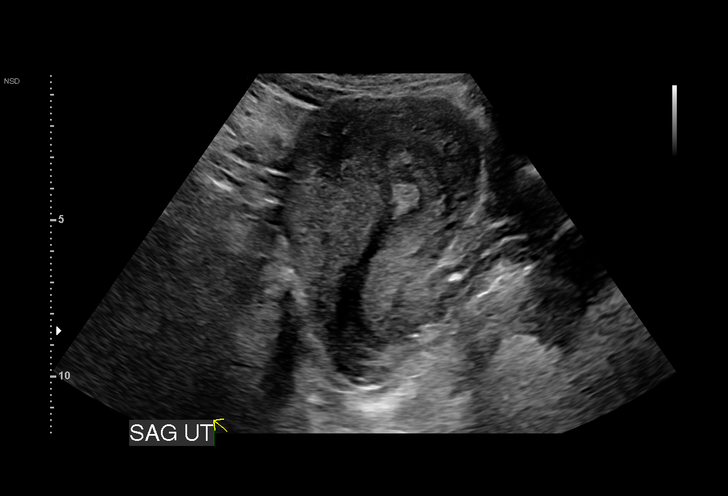
[im 3/41]
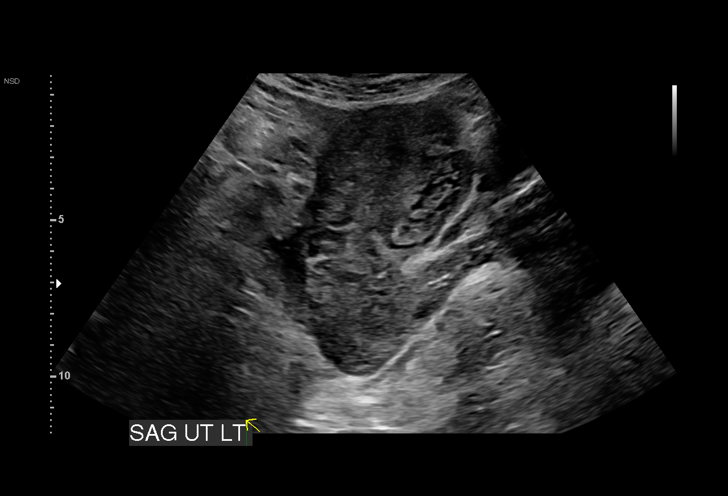
[im 6/41]
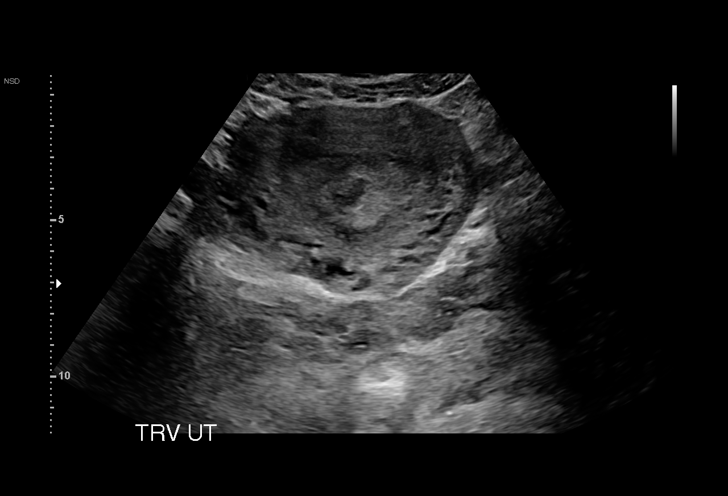
[im 9/41]
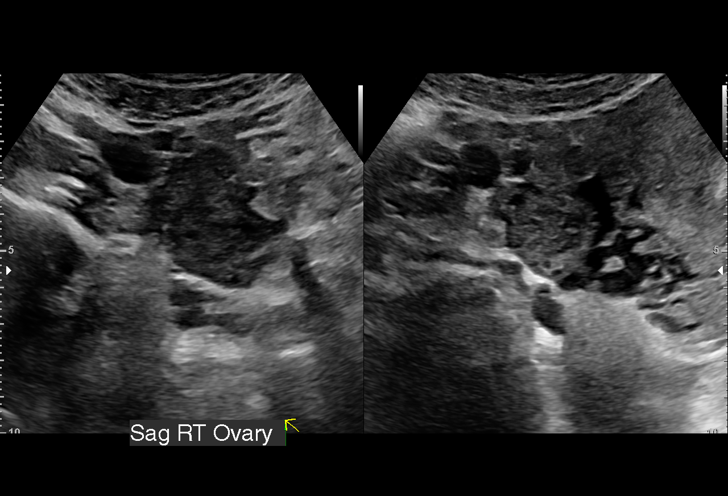
[im 12/41]
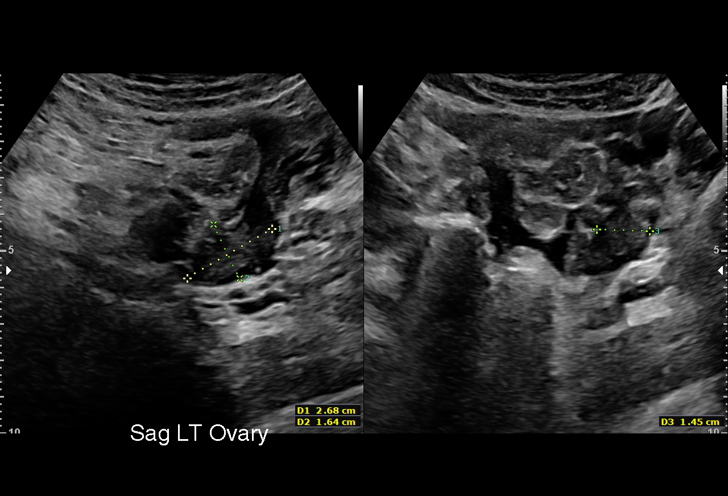
[im 15/41]
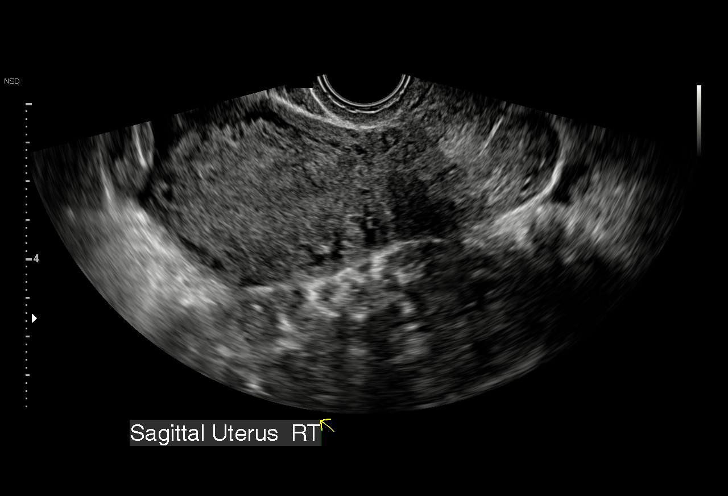
[im 18/41]
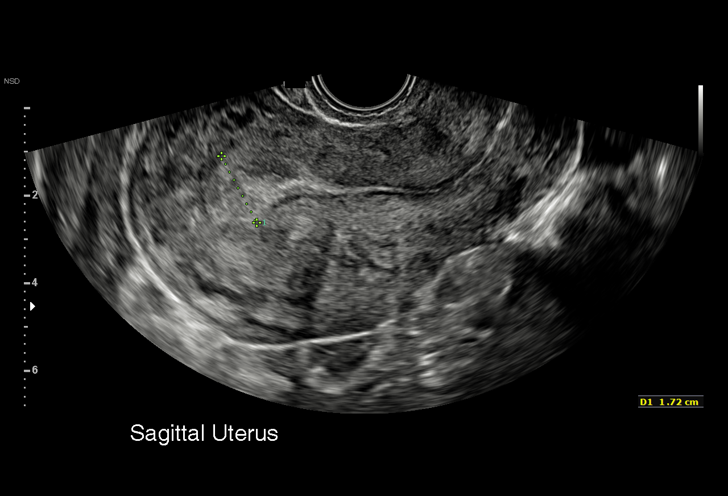
[im 21/41]
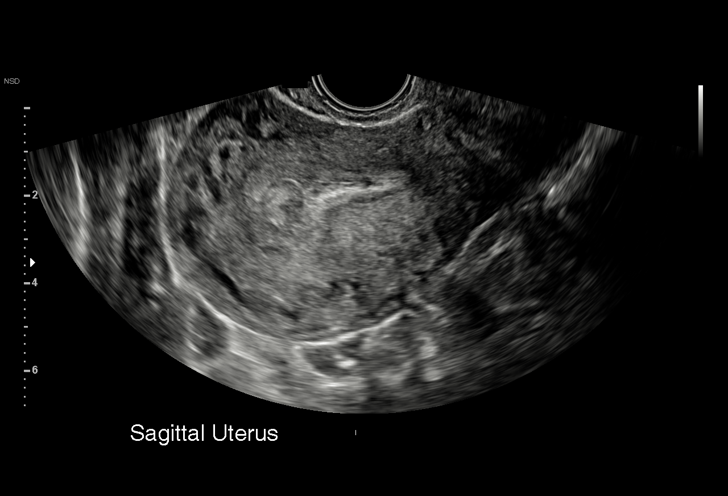
[im 23/41]
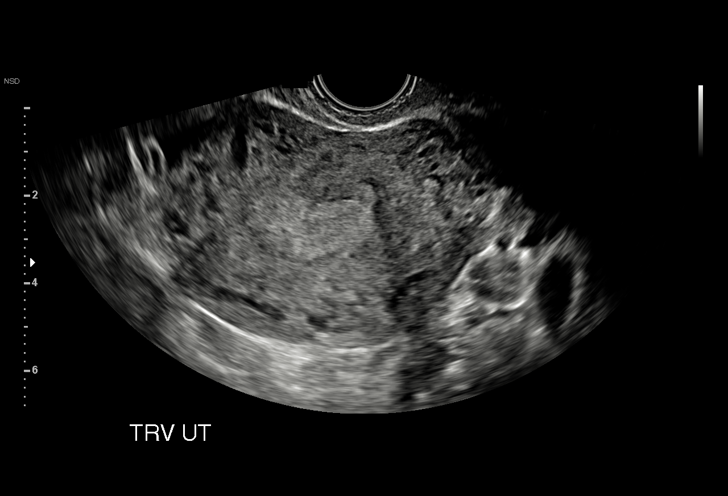
[im 26/41]
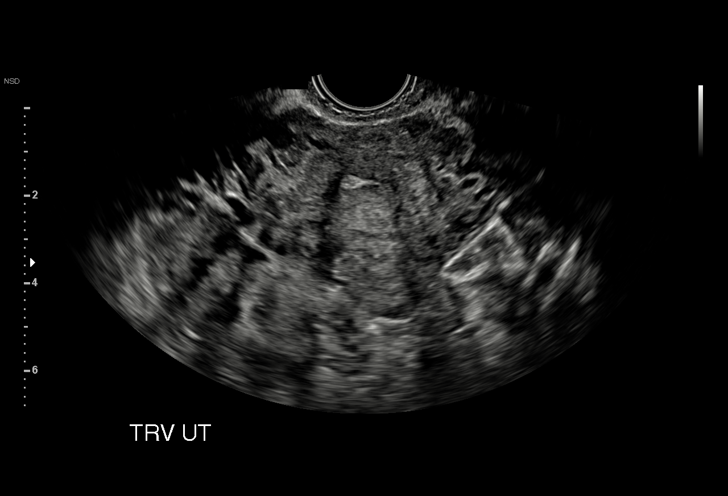
[im 29/41]
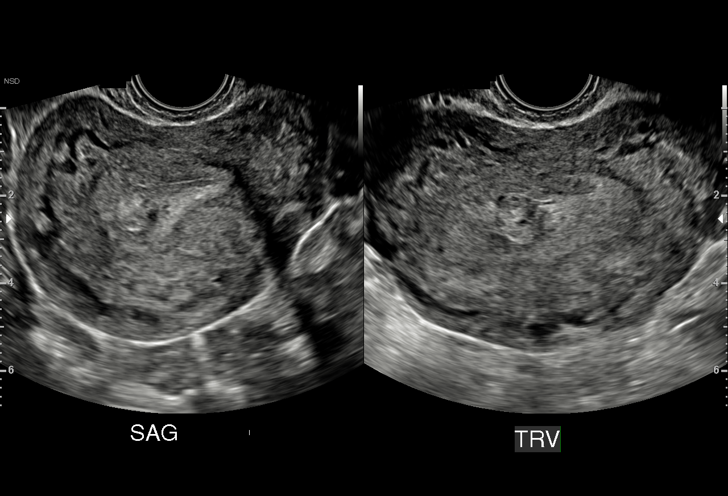
[im 32/41]
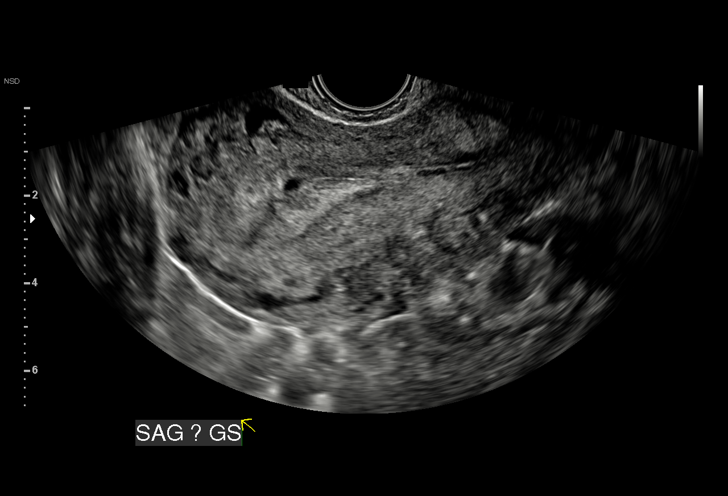
[im 35/41]
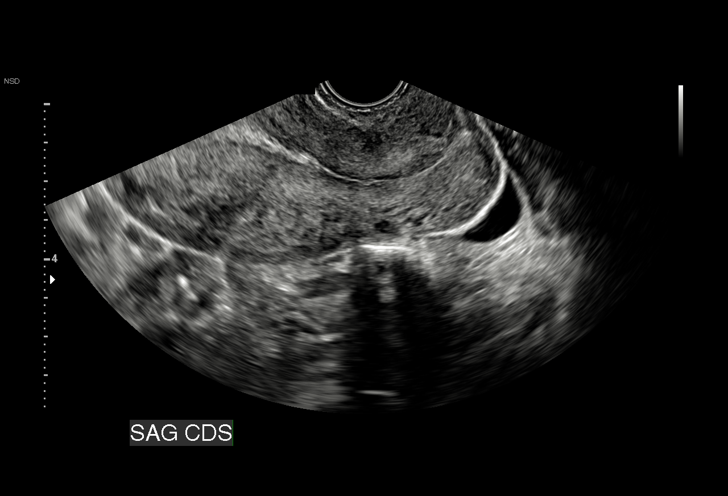
[im 38/41]
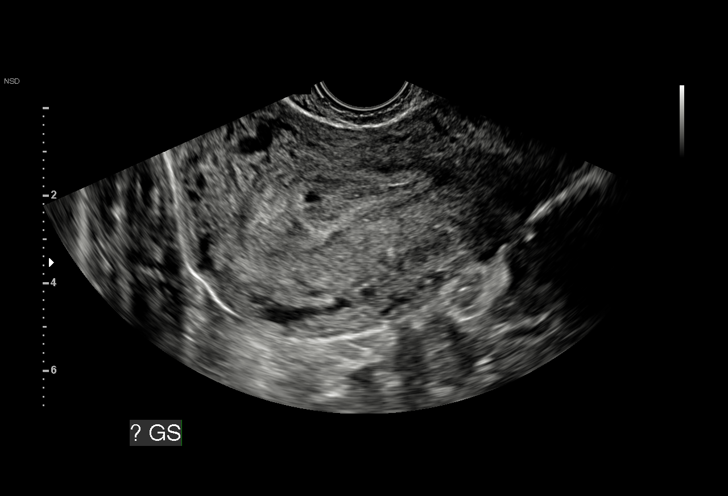
[im 41/41]
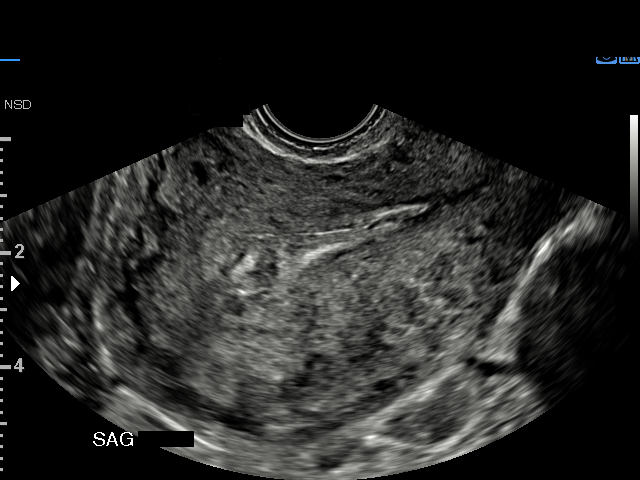

[15 of 28 positions shown; findings below may reference images not displayed]

FINDINGS: Intrauterine gestational sac: 11 mm anechoic possible gestational
sac within the endometrium,

Yolk sac:  Not present

Embryo:  Not present

Cardiac Activity: Not present

MSD: 3  mm   for w   6  d

Subchorionic hemorrhage:  None visualized.

Maternal uterus/adnexae: Normal adnexae. Small amount of free fluid.
IMPRESSION: Probable early intrauterine gestational sac, but no yolk sac, fetal
pole, or cardiac activity yet visualized. Recommend follow-up
quantitative B-HCG levels and follow-up US in 14 days to assess
viability. This recommendation follows SRU consensus guidelines:
Diagnostic Criteria for Nonviable Pregnancy Early in the First
Trimester. N Engl J Med 8376; [DATE].

## 2018-06-20 ENCOUNTER — Ambulatory Visit (HOSPITAL_COMMUNITY)
Admission: RE | Admit: 2018-06-20 | Discharge: 2018-06-20 | Disposition: A | Discharge status: Home / Self Care | Payer: Medicaid Other | Source: Ambulatory Visit | Attending: Obstetrics and Gynecology | Admitting: Obstetrics and Gynecology

## 2018-06-20 ENCOUNTER — Ambulatory Visit (INDEPENDENT_AMBULATORY_CARE_PROVIDER_SITE_OTHER): Payer: Medicaid Other | Admitting: General Practice

## 2018-06-20 DIAGNOSIS — O3680X0 Pregnancy with inconclusive fetal viability, not applicable or unspecified: Secondary | ICD-10-CM | POA: Diagnosis not present

## 2018-06-20 DIAGNOSIS — Z712 Person consulting for explanation of examination or test findings: Secondary | ICD-10-CM

## 2018-06-20 NOTE — Progress Notes (Signed)
Patient presents to office today for viability ultrasound results. Reviewed results with Dr Adrian BlackwaterStinson who finds living IUP, patient should begin prenatal care.  Informed patient of results, provided pictures and reviewed dating. Recommended she take PNV. Patient will contact [facility] office to begin prenatal care.  Chase Callerarrie H RN BSN 06/20/18

## 2018-06-21 ENCOUNTER — Telehealth: Payer: Self-pay

## 2018-06-21 MED ORDER — TERCONAZOLE 0.8 % VA CREA: 1.0000 | TOPICAL_CREAM | Freq: Every day | VAGINAL | 0 refills | Status: DC

## 2018-06-21 NOTE — Telephone Encounter (Signed)
Returned call, pt states that she recently used metrogel and now has yeast infection, sent rx per protocol.

## 2018-06-21 NOTE — Progress Notes (Signed)
Chart reviewed - agree with RN documentation.   

## 2018-06-22 NOTE — L&D Delivery Note (Addendum)
Delivery Note At 3:06 AM a viable female was delivered via Vaginal, Spontaneous (Presentation: Vertex; LOA).  APGAR: 9, 9; weight see delivery summary.   Placenta status: Spontaneous, intact.  Cord: 3 vessel with the following complications: none.   Anesthesia: None  Episiotomy: None Lacerations: None Suture Repair: None Est. Blood Loss (mL): 250  Mom to postpartum.  Baby to Couplet care / Skin to Skin.  Gerlene Fee, DO Family Medicine Resident, PGY-1 01/05/2019, 3:47 AM  I confirm that I have verified the information documented in the resident's note and that I have also personally reperformed the physical exam and all medical decision making activities.  Full PPE worn during delivery due to refusal of testing  I was gloved and present for entire delivery SVD without incident No difficulty with shoulders No lacerations  Seabron Spates, CNM  Please schedule this patient for Postpartum visit in: 4 weeks with the following provider: Any provider For C/S patients schedule nurse incision check in weeks 2 weeks: no Low risk pregnancy complicated by: none Delivery mode:  SVD Anticipated Birth Control:  Depo PP Procedures needed: none  Schedule Integrated BH visit: no

## 2018-07-07 ENCOUNTER — Ambulatory Visit (INDEPENDENT_AMBULATORY_CARE_PROVIDER_SITE_OTHER): Payer: Medicaid Other | Admitting: Obstetrics

## 2018-07-07 ENCOUNTER — Encounter: Payer: Self-pay | Admitting: Obstetrics

## 2018-07-07 ENCOUNTER — Other Ambulatory Visit (HOSPITAL_COMMUNITY)
Admission: RE | Admit: 2018-07-07 | Discharge: 2018-07-07 | Disposition: A | Discharge status: Home / Self Care | Payer: Medicaid Other | Source: Ambulatory Visit | Attending: Obstetrics | Admitting: Obstetrics

## 2018-07-07 VITALS — BP 131/75 | HR 98 | Wt 134.8 lb

## 2018-07-07 DIAGNOSIS — Z3480 Encounter for supervision of other normal pregnancy, unspecified trimester: Secondary | ICD-10-CM | POA: Insufficient documentation

## 2018-07-07 DIAGNOSIS — O219 Vomiting of pregnancy, unspecified: Secondary | ICD-10-CM

## 2018-07-07 DIAGNOSIS — Z3481 Encounter for supervision of other normal pregnancy, first trimester: Secondary | ICD-10-CM | POA: Diagnosis not present

## 2018-07-07 MED ORDER — DOXYLAMINE-PYRIDOXINE 10-10 MG PO TBEC: DELAYED_RELEASE_TABLET | ORAL | 5 refills | Status: DC

## 2018-07-07 NOTE — Progress Notes (Signed)
Subjective:    Judy [name] is being seen today for her first obstetrical visit.  This is not a planned pregnancy. She is at 756w4d gestation. Her obstetrical history is significant for none. Relationship with FOB: significant other, living together and supportive. Patient does intend to breast feed. Pregnancy history fully reviewed.  The information documented in the HPI was reviewed and verified.  Menstrual History: OB History    Gravida  5   Para  3   Term  1   Preterm  2   AB  1   Living  3     SAB  1   TAB  0   Ectopic  0   Multiple  0   Live Births  3            Patient's last menstrual period was 04/24/2018 (exact date).    Past Medical History:  Diagnosis Date  . Asthma   . History of preterm delivery   . Post partum depression   . Yeast infection of the vagina     Past Surgical History:  Procedure Laterality Date  . NO PAST SURGERIES      (Not in a hospital admission)  Allergies  Allergen Reactions  . Latex Dermatitis  . Penicillins Hives  . Tape Rash    Social History   Tobacco Use  . Smoking status: Never Smoker  . Smokeless tobacco: Never Used  Substance Use Topics  . Alcohol use: No    Family History  Problem Relation Age of Onset  . Hypertension Mother   . Diabetes Mother   . Asthma Father   . Hypertension Paternal Aunt   . Hypertension Maternal Grandfather   . Heart disease Paternal Grandmother   . Heart disease Paternal Grandfather      Review of Systems Constitutional: negative for weight loss Gastrointestinal: negative for vomiting Genitourinary:negative for genital lesions and vaginal discharge and dysuria Musculoskeletal:negative for back pain Behavioral/Psych: negative for abusive relationship, depression, illegal drug usage and tobacco use    Objective:    BP 131/75   Pulse 98   Wt 134 lb 12.8 oz (61.1 kg)   LMP 04/24/2018 (Exact Date)   BMI 24.66 kg/m  General Appearance:    Alert, cooperative, no  distress, appears stated age  Head:    Normocephalic, without obvious abnormality, atraumatic  Eyes:    PERRL, conjunctiva/corneas clear, EOM's intact, fundi    benign, both eyes  Ears:    Normal TM's and external ear canals, both ears  Nose:   Nares normal, septum midline, mucosa normal, no drainage    or sinus tenderness  Throat:   Lips, mucosa, and tongue normal; teeth and gums normal  Neck:   Supple, symmetrical, trachea midline, no adenopathy;    thyroid:  no enlargement/tenderness/nodules; no carotid   bruit or JVD  Back:     Symmetric, no curvature, ROM normal, no CVA tenderness  Lungs:     Clear to auscultation bilaterally, respirations unlabored  Chest Wall:    No tenderness or deformity   Heart:    Regular rate and rhythm, S1 and S2 normal, no murmur, rub   or gallop  Breast Exam:    No tenderness, masses, or nipple abnormality  Abdomen:     Soft, non-tender, bowel sounds active all four quadrants,    no masses, no organomegaly  Genitalia:    Normal female without lesion, discharge or tenderness  Extremities:   Extremities normal, atraumatic, no cyanosis  or edema  Pulses:   2+ and symmetric all extremities  Skin:   Skin color, texture, turgor normal, no rashes or lesions  Lymph nodes:   Cervical, supraclavicular, and axillary nodes normal  Neurologic:   CNII-XII intact, normal strength, sensation and reflexes    throughout      Lab Review Urine pregnancy test Labs reviewed yes Radiologic studies reviewed yes  Assessment:    Pregnancy at 122w4d weeks    Plan:     1. Supervision of other normal pregnancy, antepartum Rx: - Obstetric Panel, Including HIV - Culture, OB Urine - Hemoglobinopathy evaluation - Cystic Fibrosis Mutation 97 - SMN1 COPY NUMBER ANALYSIS (SMA Carrier Screen) - CHL AMB BABYSCRIPTS OPT IN - Cervicovaginal ancillary only( [place]) - Cytology - PAP( [place]) - Influenza a and b  2. Nausea and vomiting in pregnancy prior to [redacted] weeks  gestation Rx: - Doxylamine-Pyridoxine (DICLEGIS) 10-10 MG TBEC; 1 tab in AM, 1 tab mid afternoon 2 tabs at bedtime. Max dose 4 tabs daily.  Dispense: 100 tablet; Refill: 5  Prenatal vitamins.  Counseling provided regarding continued use of seat belts, cessation of alcohol consumption, smoking or use of illicit drugs; infection precautions i.e., influenza/TDAP immunizations, toxoplasmosis,CMV, parvovirus, listeria and varicella; workplace safety, exercise during pregnancy; routine dental care, safe medications, sexual activity, hot tubs, saunas, pools, travel, caffeine use, fish and methlymercury, potential toxins, hair treatments, varicose veins Weight gain recommendations per IOM guidelines reviewed: underweight/BMI< 18.5--> gain 28 - 40 lbs; normal weight/BMI 18.5 - 24.9--> gain 25 - 35 lbs; overweight/BMI 25 - 29.9--> gain 15 - 25 lbs; obese/BMI >30->gain  11 - 20 lbs Problem list reviewed and updated. FIRST/CF mutation testing/NIPT/QUAD SCREEN/fragile X/[name] population testing/Spinal muscular atrophy discussed: requested. Role of ultrasound in pregnancy discussed; fetal survey: requested. Amniocentesis discussed: not indicated.  No orders of the defined types were placed in this encounter.  No orders of the defined types were placed in this encounter.   Follow up in 4 weeks. 50% of 20 min visit spent on counseling and coordination of care.     Brock BadHARLES A. HARPER MD 07-07-2018

## 2018-07-08 LAB — CERVICOVAGINAL ANCILLARY ONLY
BACTERIAL VAGINITIS: POSITIVE — AB
CANDIDA VAGINITIS: NEGATIVE
CHLAMYDIA, DNA PROBE: NEGATIVE
Neisseria Gonorrhea: NEGATIVE
TRICH (WINDOWPATH): NEGATIVE

## 2018-07-08 LAB — INFLUENZA A AND B
Influenza A Ag, EIA: NEGATIVE
Influenza B Ag, EIA: NEGATIVE

## 2018-07-08 LAB — PLEASE NOTE:

## 2018-07-09 LAB — URINE CULTURE, OB REFLEX

## 2018-07-09 LAB — CULTURE, OB URINE

## 2018-07-11 LAB — CYTOLOGY - PAP: Diagnosis: NEGATIVE

## 2018-07-17 ENCOUNTER — Other Ambulatory Visit: Payer: Self-pay | Admitting: Obstetrics

## 2018-07-17 DIAGNOSIS — B3731 Acute candidiasis of vulva and vagina: Secondary | ICD-10-CM

## 2018-07-17 DIAGNOSIS — N760 Acute vaginitis: Secondary | ICD-10-CM

## 2018-07-17 DIAGNOSIS — B373 Candidiasis of vulva and vagina: Secondary | ICD-10-CM

## 2018-07-17 DIAGNOSIS — B9689 Other specified bacterial agents as the cause of diseases classified elsewhere: Secondary | ICD-10-CM

## 2018-07-17 MED ORDER — TERCONAZOLE 0.4 % VA CREA: 1.0000 | TOPICAL_CREAM | Freq: Every day | VAGINAL | 0 refills | Status: DC

## 2018-07-17 MED ORDER — TINIDAZOLE 500 MG PO TABS: 1000.0000 mg | ORAL_TABLET | Freq: Every day | ORAL | 2 refills | Status: DC

## 2018-07-18 ENCOUNTER — Other Ambulatory Visit: Payer: Self-pay | Admitting: Obstetrics

## 2018-07-18 LAB — SMN1 COPY NUMBER ANALYSIS (SMA CARRIER SCREENING)

## 2018-07-18 LAB — OBSTETRIC PANEL, INCLUDING HIV
ANTIBODY SCREEN: NEGATIVE
BASOS ABS: 0 10*3/uL (ref 0.0–0.2)
Basos: 0 %
EOS (ABSOLUTE): 0.3 10*3/uL (ref 0.0–0.4)
Eos: 4 %
HEMATOCRIT: 38.6 % (ref 34.0–46.6)
HIV Screen 4th Generation wRfx: NONREACTIVE
Hemoglobin: 13.1 g/dL (ref 11.1–15.9)
Hepatitis B Surface Ag: NEGATIVE
Immature Grans (Abs): 0 10*3/uL (ref 0.0–0.1)
Immature Granulocytes: 0 %
LYMPHS: 18 %
Lymphocytes Absolute: 1.1 10*3/uL (ref 0.7–3.1)
MCH: 27.1 pg (ref 26.6–33.0)
MCHC: 33.9 g/dL (ref 31.5–35.7)
MCV: 80 fL (ref 79–97)
MONOS ABS: 0.7 10*3/uL (ref 0.1–0.9)
Monocytes: 11 %
NEUTROS ABS: 4.1 10*3/uL (ref 1.4–7.0)
Neutrophils: 67 %
PLATELETS: 305 10*3/uL (ref 150–450)
RBC: 4.83 x10E6/uL (ref 3.77–5.28)
RDW: 13 % (ref 11.7–15.4)
RH TYPE: POSITIVE
RPR Ser Ql: NONREACTIVE
Rubella Antibodies, IGG: 5.98 index (ref >0.99)
WBC: 6.1 10*3/uL (ref 3.4–10.8)

## 2018-07-18 LAB — CYSTIC FIBROSIS MUTATION 97: GENE DIS ANAL CARRIER INTERP BLD/T-IMP: NOT DETECTED

## 2018-07-18 LAB — HEMOGLOBINOPATHY EVALUATION
HGB C: 0 %
HGB S: 0 %
HGB VARIANT: 0 %
Hemoglobin A2 Quantitation: 2.5 % (ref 1.8–3.2)
Hemoglobin F Quantitation: 0 % (ref 0.0–2.0)
Hgb A: 97.5 % (ref 96.4–98.8)

## 2018-08-04 ENCOUNTER — Encounter: Payer: Medicaid Other | Admitting: Obstetrics

## 2018-08-10 ENCOUNTER — Encounter: Payer: Self-pay | Admitting: Obstetrics

## 2018-08-10 ENCOUNTER — Ambulatory Visit (INDEPENDENT_AMBULATORY_CARE_PROVIDER_SITE_OTHER): Payer: Medicaid Other | Admitting: Obstetrics

## 2018-08-10 VITALS — BP 122/73 | HR 81 | Wt 129.0 lb

## 2018-08-10 DIAGNOSIS — Z3482 Encounter for supervision of other normal pregnancy, second trimester: Secondary | ICD-10-CM

## 2018-08-10 DIAGNOSIS — Z3480 Encounter for supervision of other normal pregnancy, unspecified trimester: Secondary | ICD-10-CM

## 2018-08-10 DIAGNOSIS — M549 Dorsalgia, unspecified: Secondary | ICD-10-CM

## 2018-08-10 DIAGNOSIS — Z3A14 14 weeks gestation of pregnancy: Secondary | ICD-10-CM

## 2018-08-10 MED ORDER — COMFORT FIT MATERNITY SUPP SM MISC: 0 refills | Status: DC

## 2018-08-10 NOTE — Progress Notes (Signed)
Pt presents for ROB c/o nausea.

## 2018-08-10 NOTE — Progress Notes (Signed)
Subjective:  Judy [name] is a 23 y.o. [phone] at [redacted]w[redacted]d being seen today for ongoing prenatal care.  She is currently monitored for the following issues for this low-risk pregnancy and has Subacute pyelonephritis; Depressive disorder, not elsewhere classified; Dysmenorrhea; Excessive or frequent menstruation; Supervision of other normal pregnancy, antepartum; Abnormal Pap smear of cervix; Pregnancy of unknown anatomic location; and Encounter for supervision of other normal pregnancy, unspecified trimester on their problem list.  Patient reports backache.  Contractions: Not present. Vag. Bleeding: None.  Movement: Present. Denies leaking of fluid.   The following portions of the patient's history were reviewed and updated as appropriate: allergies, current medications, past family history, past medical history, past social history, past surgical history and problem list. Problem list updated.  Objective:   Vitals:   08/10/18 1050  BP: 122/73  Pulse: 81  Weight: 129 lb (58.5 kg)    Fetal Status:     Movement: Present     General:  Alert, oriented and cooperative. Patient is in no acute distress.  Skin: Skin is warm and dry. No rash noted.   Cardiovascular: Normal heart rate noted  Respiratory: Normal respiratory effort, no problems with respiration noted  Abdomen: Soft, gravid, appropriate for gestational age. Pain/Pressure: Absent     Pelvic:  Cervical exam deferred        Extremities: Normal range of motion.  Edema: None  Mental Status: Normal mood and affect. Normal behavior. Normal judgment and thought content.   Urinalysis:      Assessment and Plan:  Pregnancy: [id number] at [redacted]w[redacted]d  1. Supervision of other normal pregnancy, antepartum Rx: - Genetic Screening; Future  2. Backache symptom Rx: - Elastic Bandages & Supports (COMFORT FIT MATERNITY SUPP SM) MISC; Wear as directed.  Dispense: 1 each; Refill: 0  Preterm labor symptoms and general obstetric precautions including  but not limited to vaginal bleeding, contractions, leaking of fluid and fetal movement were reviewed in detail with the patient. Please refer to After Visit Summary for other counseling recommendations.  Return in about 2 weeks (around 08/24/2018) for ROB.  AFP, .   Brock Bad, MD

## 2018-08-14 ENCOUNTER — Encounter (HOSPITAL_COMMUNITY): Payer: Self-pay

## 2018-08-14 ENCOUNTER — Inpatient Hospital Stay (HOSPITAL_COMMUNITY)
Admission: EM | Admit: 2018-08-14 | Discharge: 2018-08-14 | Disposition: A | Discharge status: Home / Self Care | Payer: Medicaid Other | Attending: Obstetrics & Gynecology | Admitting: Obstetrics & Gynecology

## 2018-08-14 DIAGNOSIS — R109 Unspecified abdominal pain: Secondary | ICD-10-CM | POA: Insufficient documentation

## 2018-08-14 DIAGNOSIS — Z3A15 15 weeks gestation of pregnancy: Secondary | ICD-10-CM | POA: Diagnosis not present

## 2018-08-14 DIAGNOSIS — O26892 Other specified pregnancy related conditions, second trimester: Secondary | ICD-10-CM

## 2018-08-14 DIAGNOSIS — O99352 Diseases of the nervous system complicating pregnancy, second trimester: Secondary | ICD-10-CM | POA: Diagnosis not present

## 2018-08-14 DIAGNOSIS — R1030 Lower abdominal pain, unspecified: Secondary | ICD-10-CM | POA: Diagnosis not present

## 2018-08-14 DIAGNOSIS — G43909 Migraine, unspecified, not intractable, without status migrainosus: Secondary | ICD-10-CM | POA: Diagnosis not present

## 2018-08-14 DIAGNOSIS — M549 Dorsalgia, unspecified: Secondary | ICD-10-CM | POA: Diagnosis present

## 2018-08-14 DIAGNOSIS — Z880 Allergy status to penicillin: Secondary | ICD-10-CM | POA: Diagnosis not present

## 2018-08-14 LAB — URINALYSIS, ROUTINE W REFLEX MICROSCOPIC
BILIRUBIN URINE: NEGATIVE
Glucose, UA: NEGATIVE mg/dL
Hgb urine dipstick: NEGATIVE
KETONES UR: 20 mg/dL — AB
Nitrite: NEGATIVE
Protein, ur: NEGATIVE mg/dL
Specific Gravity, Urine: 1.024 (ref 1.005–1.030)
pH: 6 (ref 5.0–8.0)

## 2018-08-14 LAB — CBC
HCT: 37.7 % (ref 36.0–46.0)
Hemoglobin: 12.5 g/dL (ref 12.0–15.0)
MCH: 26.3 pg (ref 26.0–34.0)
MCHC: 33.2 g/dL (ref 30.0–36.0)
MCV: 79.4 fL — ABNORMAL LOW (ref 80.0–100.0)
Platelets: 267 10*3/uL (ref 150–400)
RBC: 4.75 MIL/uL (ref 3.87–5.11)
RDW: 11.6 % (ref 11.5–15.5)
WBC: 7.5 10*3/uL (ref 4.0–10.5)
nRBC: 0 % (ref 0.0–0.2)

## 2018-08-14 LAB — COMPREHENSIVE METABOLIC PANEL
ALT: 9 U/L (ref 0–44)
AST: 16 U/L (ref 15–41)
Albumin: 3.2 g/dL — ABNORMAL LOW (ref 3.5–5.0)
Alkaline Phosphatase: 42 U/L (ref 38–126)
Anion gap: 8 (ref 5–15)
CO2: 23 mmol/L (ref 22–32)
Calcium: 9.4 mg/dL (ref 8.9–10.3)
Chloride: 105 mmol/L (ref 98–111)
Creatinine, Ser: 0.58 mg/dL (ref 0.44–1.00)
GFR calc Af Amer: >60 mL/min (ref >60)
GFR calc non Af Amer: >60 mL/min (ref >60)
Glucose, Bld: 74 mg/dL (ref 70–99)
Potassium: 3.9 mmol/L (ref 3.5–5.1)
Sodium: 136 mmol/L (ref 135–145)
Total Bilirubin: 0.4 mg/dL (ref 0.3–1.2)
Total Protein: 6.9 g/dL (ref 6.5–8.1)

## 2018-08-14 LAB — WET PREP, GENITAL
Sperm: NONE SEEN
Trich, Wet Prep: NONE SEEN
Yeast Wet Prep HPF POC: NONE SEEN

## 2018-08-14 MED ORDER — BUTALBITAL-APAP-CAFFEINE 50-325-40 MG PO TABS: 2.0000 | ORAL_TABLET | Freq: Four times a day (QID) | ORAL | Status: DC | PRN

## 2018-08-14 MED ORDER — DIPHENHYDRAMINE HCL 50 MG/ML IJ SOLN
25.0000 mg | Freq: Once | INTRAMUSCULAR | Status: AC
  Administered 2018-08-14: 25 mg via INTRAVENOUS
  Filled 2018-08-14: qty 1

## 2018-08-14 MED ORDER — METOCLOPRAMIDE HCL 5 MG/ML IJ SOLN
10.0000 mg | Freq: Once | INTRAMUSCULAR | Status: AC
  Administered 2018-08-14: 10 mg via INTRAVENOUS
  Filled 2018-08-14: qty 2

## 2018-08-14 MED ORDER — METRONIDAZOLE 500 MG PO TABS: 500.0000 mg | ORAL_TABLET | Freq: Three times a day (TID) | ORAL | 0 refills | Status: DC

## 2018-08-14 MED ORDER — DEXAMETHASONE SODIUM PHOSPHATE 10 MG/ML IJ SOLN
10.0000 mg | Freq: Once | INTRAMUSCULAR | Status: AC
  Administered 2018-08-14: 10 mg via INTRAVENOUS
  Filled 2018-08-14: qty 1

## 2018-08-14 MED ORDER — LACTATED RINGERS IV SOLN
INTRAVENOUS | Status: DC
  Administered 2018-08-14: 14:00:00 via INTRAVENOUS

## 2018-08-14 MED ORDER — BUTALBITAL-APAP-CAFFEINE 50-325-40 MG PO TABS: 2.0000 | ORAL_TABLET | Freq: Four times a day (QID) | ORAL | 1 refills | Status: DC | PRN

## 2018-08-14 NOTE — ED Triage Notes (Signed)
[redacted] weeks pregnant.  EDC 02-05-19.  G5P3.  Complications with this pregnancy, abd pain, pt has seen OB, was advised to take Tylenol.  Taking Tylenol with no relief.   Onset 3 days migraine, no h/o migraines and lower abd pain, aching pain, different than usual abd pain.  Onset yesterday low back pain, shooting pain.   Vomited yesterday x 2.  NO leaking fluid, vaginal bleeding.   OB Dr. Clearance Coots

## 2018-08-14 NOTE — ED Notes (Signed)
Called Ginger at [facility], pt to come to [facility]

## 2018-08-14 NOTE — MAU Note (Signed)
Judy [name] is a 23 y.o. at [redacted]w[redacted]d here in [facility] reporting: migraine that started 3 days ago, tried tylenol but it did not help. Also having abdominal pain and back pain that started 2 days ago. No bleeding, reports vaginal discharge is normal.  Onset of complaint: ongoing for a few days  Vitals:   08/14/18 1243  BP: (!) 119/47  Pulse: 83  Resp: 18  Temp: 98.9 F (37.2 C)  SpO2: 100%      FHT:156  Lab orders placed from triage: UA

## 2018-08-14 NOTE — MAU Note (Signed)
K[place] CNM states that RN does not need to give flexeril here, pt is to take medication at home and use as a rescue med

## 2018-08-14 NOTE — MAU Provider Note (Signed)
Patient Judy [name] is a 23 y.o.  [phone] At [redacted]w[redacted]d here with complaints of migraine for three days and abdominal pain for 2 days that was preceded by back pain. She denies vaginal bleeding, abnormal discharge, dysuria, or other ob-gyn complaint.  History     CSN: [id number]  Arrival date and time: 08/14/18 1146   First Provider Initiated Contact with Patient 08/14/18 1303      Chief Complaint  Patient presents with  . Migraine  . Back Pain  . Abdominal Pain   Migraine   This is a new problem. The current episode started in the past 7 days. The problem occurs constantly. The problem has been unchanged. The pain quality is not similar to prior headaches. The pain is at a severity of 10/10. Associated symptoms include abdominal pain and back pain. The symptoms are aggravated by bright light. She has tried acetaminophen for the symptoms.  Back Pain  This is a new problem. The current episode started in the past 7 days. The problem occurs intermittently. Progression since onset: feels like the pain has now moved to the front of her belly. Associated symptoms include abdominal pain.  Abdominal Pain     OB History    Gravida  5   Para  3   Term  1   Preterm  2   AB  1   Living  3     SAB  1   TAB  0   Ectopic  0   Multiple  0   Live Births  3           Past Medical History:  Diagnosis Date  . Asthma   . History of preterm delivery   . Post partum depression   . Yeast infection of the vagina     Past Surgical History:  Procedure Laterality Date  . NO PAST SURGERIES      Family History  Problem Relation Age of Onset  . Hypertension Mother   . Diabetes Mother   . Asthma Father   . Hypertension Paternal Aunt   . Hypertension Maternal Grandfather   . Heart disease Paternal Grandmother   . Heart disease Paternal Grandfather     Social History   Tobacco Use  . Smoking status: Never Smoker  . Smokeless tobacco: Never Used  Substance Use  Topics  . Alcohol use: No  . Drug use: Not Currently    Allergies:  Allergies  Allergen Reactions  . Latex Dermatitis  . Penicillins Hives  . Tape Rash    Medications Prior to Admission  Medication Sig Dispense Refill Last Dose  . albuterol (PROVENTIL HFA;VENTOLIN HFA) 108 (90 BASE) MCG/ACT inhaler Inhale 2 puffs into the lungs every 6 (six) hours as needed for wheezing or shortness of breath.   Not Taking  . Doxylamine-Pyridoxine (DICLEGIS) 10-10 MG TBEC 1 tab in AM, 1 tab mid afternoon 2 tabs at bedtime. Max dose 4 tabs daily. (Patient not taking: Reported on 08/10/2018) 100 tablet 5 Not Taking  . Elastic Bandages & Supports (COMFORT FIT MATERNITY SUPP SM) MISC Wear as directed. 1 each 0   . tinidazole (TINDAMAX) 500 MG tablet Take 2 tablets (1,000 mg total) by mouth daily with breakfast. (Patient not taking: Reported on 08/10/2018) 10 tablet 2 Not Taking    Review of Systems  Constitutional: Negative.   HENT: Negative.   Respiratory: Negative.   Gastrointestinal: Positive for abdominal pain.  Endocrine: Negative.   Genitourinary: Negative.   Musculoskeletal:  Positive for back pain.   Physical Exam   Blood pressure (!) 119/47, pulse 83, temperature 98.9 F (37.2 C), temperature source Oral, resp. rate 18, last menstrual period 04/24/2018, SpO2 100 %.  Physical Exam  Constitutional: She appears well-developed and well-nourished.  HENT:  Head: Normocephalic.  Eyes: Pupils are equal, round, and reactive to light.  Respiratory: Effort normal.  GI: Soft.  Genitourinary:    Vaginal discharge present.     Genitourinary Comments: NEFG; copious amounts of mucous discharge; no odor. Cervix is long, closed, thick, no CMT.    Musculoskeletal: Normal range of motion.  Neurological: She is alert.  Skin: Skin is warm.    MAU Course  Procedures  MDM -FHR is 156 -wet prep  show moderate yeast, will treat with flagyl -UA shows bacteria, will send for culture.  -CBC and CMP  normal.  -RX for Fioricet  Assessment and Plan   1. Migraine without status migrainosus, not intractable, unspecified migraine type    2. Patient stable for discharge with return precautions reviewed.   3. Will D/C with fioricet and flagyl.   4. Return precautions reviewed; patient knows that we will call her if she needs any other treatment.    Charlesetta Garibaldi Zakariah Urwin 08/14/2018, 1:15 PM

## 2018-08-14 NOTE — Discharge Instructions (Signed)
-  you can take medicines for migraine that have iburpofen in it until 28 weeks.  -return to [facility]U if you do not feel better after ibuprofen for 24 hours or taking fioricet as a rescue medicine.   Migraine Headache  A migraine headache is a very strong throbbing pain on one side or both sides of your head. Migraines can also cause other symptoms. Talk with your doctor about what things may bring on (trigger) your migraine headaches. Follow these instructions at home: Medicines  Take over-the-counter and prescription medicines only as told by your doctor.  Do not drive or use heavy machinery while taking prescription pain medicine.  To prevent or treat constipation while you are taking prescription pain medicine, your doctor may recommend that you: ? Drink enough fluid to keep your pee (urine) clear or pale yellow. ? Take over-the-counter or prescription medicines. ? Eat foods that are high in fiber. These include fresh fruits and vegetables, whole grains, and beans. ? Limit foods that are high in fat and processed sugars. These include fried and sweet foods. Lifestyle  Avoid alcohol.  Do not use any products that contain nicotine or tobacco, such as cigarettes and e-cigarettes. If you need help quitting, ask your doctor.  Get at least 8 hours of sleep every night.  Limit your stress. General instructions   Keep a journal to find out what may bring on your migraines. For example, write down: ? What you eat and drink. ? How much sleep you get. ? Any change in what you eat or drink. ? Any change in your medicines.  If you have a migraine: ? Avoid things that make your symptoms worse, such as bright lights. ? It may help to lie down in a dark, quiet room. ? Do not drive or use heavy machinery. ? Ask your doctor what activities are safe for you.  Keep all follow-up visits as told by your doctor. This is important. Contact a doctor if:  You get a migraine that is different or worse  than your usual migraines. Get help right away if:  Your migraine gets very bad.  You have a fever.  You have a stiff neck.  You have trouble seeing.  Your muscles feel weak or like you cannot control them.  You start to lose your balance a lot.  You start to have trouble walking.  You pass out (faint). This information is not intended to replace advice given to you by your health care provider. Make sure you discuss any questions you have with your health care provider. Document Released: 03/17/2008 Document Revised: 03/02/2018 Document Reviewed: 11/25/2015 Elsevier Interactive Patient Education  2019 [employer].

## 2018-08-16 LAB — GC/CHLAMYDIA PROBE AMP (~~LOC~~) NOT AT ARMC
Chlamydia: NEGATIVE
Neisseria Gonorrhea: NEGATIVE

## 2018-08-16 LAB — CULTURE, OB URINE: Culture: <10000 — AB

## 2018-08-22 ENCOUNTER — Other Ambulatory Visit: Payer: Self-pay

## 2018-08-22 ENCOUNTER — Encounter: Payer: Self-pay | Admitting: Obstetrics

## 2018-08-22 DIAGNOSIS — Z3480 Encounter for supervision of other normal pregnancy, unspecified trimester: Secondary | ICD-10-CM

## 2018-08-22 NOTE — Progress Notes (Signed)
error 

## 2018-08-24 ENCOUNTER — Ambulatory Visit (INDEPENDENT_AMBULATORY_CARE_PROVIDER_SITE_OTHER): Payer: Medicaid Other | Admitting: Obstetrics

## 2018-08-24 ENCOUNTER — Encounter: Payer: Self-pay | Admitting: Obstetrics

## 2018-08-24 VITALS — BP 111/64 | HR 103 | Wt 132.0 lb

## 2018-08-24 DIAGNOSIS — Z3A16 16 weeks gestation of pregnancy: Secondary | ICD-10-CM

## 2018-08-24 DIAGNOSIS — O26892 Other specified pregnancy related conditions, second trimester: Secondary | ICD-10-CM

## 2018-08-24 DIAGNOSIS — O26899 Other specified pregnancy related conditions, unspecified trimester: Secondary | ICD-10-CM

## 2018-08-24 DIAGNOSIS — G43909 Migraine, unspecified, not intractable, without status migrainosus: Secondary | ICD-10-CM

## 2018-08-24 DIAGNOSIS — Z3480 Encounter for supervision of other normal pregnancy, unspecified trimester: Secondary | ICD-10-CM

## 2018-08-24 DIAGNOSIS — M549 Dorsalgia, unspecified: Secondary | ICD-10-CM

## 2018-08-24 DIAGNOSIS — R12 Heartburn: Secondary | ICD-10-CM

## 2018-08-24 MED ORDER — COMFORT FIT MATERNITY SUPP SM MISC: 0 refills | Status: DC

## 2018-08-24 MED ORDER — CYCLOBENZAPRINE HCL 5 MG PO TABS: 5.0000 mg | ORAL_TABLET | Freq: Three times a day (TID) | ORAL | 2 refills | Status: DC | PRN

## 2018-08-24 MED ORDER — BUTALBITAL-APAP-CAFFEINE 50-325-40 MG PO TABS: 2.0000 | ORAL_TABLET | Freq: Four times a day (QID) | ORAL | 2 refills | Status: DC | PRN

## 2018-08-24 MED ORDER — CALCIUM CARBONATE ANTACID 500 MG PO CHEW: 1.0000 | CHEWABLE_TABLET | Freq: Every day | ORAL | 99 refills | Status: DC

## 2018-08-24 NOTE — Progress Notes (Signed)
Subjective:  Judy [name] is a 23 y.o. [phone] at [redacted]w[redacted]d being seen today for ongoing prenatal care.  She is currently monitored for the following issues for this low-risk pregnancy and has Subacute pyelonephritis; Depressive disorder, not elsewhere classified; Dysmenorrhea; Excessive or frequent menstruation; Supervision of other normal pregnancy, antepartum; Abnormal Pap smear of cervix; Pregnancy of unknown anatomic location; and Encounter for supervision of other normal pregnancy, unspecified trimester on their problem list.  Patient reports backache, headache and heartburn.  Contractions: Not present. Vag. Bleeding: None.   . Denies leaking of fluid.   The following portions of the patient's history were reviewed and updated as appropriate: allergies, current medications, past family history, past medical history, past social history, past surgical history and problem list. Problem list updated.  Objective:   Vitals:   08/24/18 1312  BP: 111/64  Pulse: (!) 103  Weight: 132 lb (59.9 kg)    Fetal Status:           General:  Alert, oriented and cooperative. Patient is in no acute distress.  Skin: Skin is warm and dry. No rash noted.   Cardiovascular: Normal heart rate noted  Respiratory: Normal respiratory effort, no problems with respiration noted  Abdomen: Soft, gravid, appropriate for gestational age. Pain/Pressure: Absent     Pelvic:  Cervical exam deferred        Extremities: Normal range of motion.  Edema: None  Mental Status: Normal mood and affect. Normal behavior. Normal judgment and thought content.   Urinalysis:      Assessment and Plan:  Pregnancy: [id number] at [redacted]w[redacted]d  1. Supervision of other normal pregnancy, antepartum Rx: - AFP, Serum, Open Spina Bifida - [place] MFM OB COMP + 14 WK; Future  2. Backache symptom Rx: - cyclobenzaprine (FLEXERIL) 5 MG tablet; Take 1 tablet (5 mg total) by mouth 3 (three) times daily as needed for muscle spasms.  Dispense: 30 tablet;  Refill: 2 - Elastic Bandages & Supports (COMFORT FIT MATERNITY SUPP SM) MISC; Wear as directed.  Dispense: 1 each; Refill: 0  3. Migraine without status migrainosus, not intractable, unspecified migraine type Rx: - butalbital-acetaminophen-caffeine (FIORICET, ESGIC) 50-325-40 MG tablet; Take 2 tablets by mouth every 6 (six) hours as needed for headache.  Dispense: 30 tablet; Refill: 2  4. Heartburn during pregnancy, antepartum Rx: - calcium carbonate (TUMS) 500 MG chewable tablet; Chew 1 tablet (200 mg of elemental calcium total) by mouth daily.  Dispense: 30 tablet; Refill: prn   Preterm labor symptoms and general obstetric precautions including but not limited to vaginal bleeding, contractions, leaking of fluid and fetal movement were reviewed in detail with the patient. Please refer to After Visit Summary for other counseling recommendations.  Return in about 4 weeks (around 09/21/2018) for ROB.   Brock Bad, MD

## 2018-08-24 NOTE — Progress Notes (Signed)
Pt is here for ROB. [redacted]w[redacted]d

## 2018-08-26 LAB — AFP, SERUM, OPEN SPINA BIFIDA
AFP MOM: 0.85
AFP Value: 35.3 ng/mL
Gest. Age on Collection Date: 16.4 weeks
Maternal Age At EDD: 23.6 yr
OSBR Risk 1 IN: 10000
Test Results:: NEGATIVE
Weight: 132 [lb_av]

## 2018-09-06 ENCOUNTER — Encounter (HOSPITAL_COMMUNITY): Payer: Self-pay

## 2018-09-15 ENCOUNTER — Other Ambulatory Visit: Payer: Self-pay

## 2018-09-15 ENCOUNTER — Ambulatory Visit (HOSPITAL_COMMUNITY)
Admission: RE | Admit: 2018-09-15 | Discharge: 2018-09-15 | Disposition: A | Discharge status: Home / Self Care | Payer: Medicaid Other | Source: Ambulatory Visit | Attending: Obstetrics and Gynecology | Admitting: Obstetrics and Gynecology

## 2018-09-15 DIAGNOSIS — O09212 Supervision of pregnancy with history of pre-term labor, second trimester: Secondary | ICD-10-CM | POA: Diagnosis not present

## 2018-09-15 DIAGNOSIS — Z3480 Encounter for supervision of other normal pregnancy, unspecified trimester: Secondary | ICD-10-CM | POA: Diagnosis not present

## 2018-09-15 DIAGNOSIS — Z363 Encounter for antenatal screening for malformations: Secondary | ICD-10-CM | POA: Diagnosis not present

## 2018-09-15 DIAGNOSIS — Z3A19 19 weeks gestation of pregnancy: Secondary | ICD-10-CM | POA: Diagnosis not present

## 2018-09-16 ENCOUNTER — Other Ambulatory Visit: Payer: Self-pay | Admitting: Obstetrics

## 2018-09-16 ENCOUNTER — Other Ambulatory Visit: Payer: Self-pay

## 2018-09-16 ENCOUNTER — Telehealth: Payer: Self-pay | Admitting: Obstetrics

## 2018-09-16 DIAGNOSIS — N760 Acute vaginitis: Secondary | ICD-10-CM

## 2018-09-16 DIAGNOSIS — B9689 Other specified bacterial agents as the cause of diseases classified elsewhere: Secondary | ICD-10-CM

## 2018-09-16 MED ORDER — TINIDAZOLE 500 MG PO TABS: 1000.0000 mg | ORAL_TABLET | Freq: Every day | ORAL | 2 refills | Status: DC

## 2018-09-16 NOTE — Telephone Encounter (Signed)
Patient called requesting a refill on her headache medication and medication for BV.  I do show she has two refills of her Fioricet at her pharmacy.  I told her to contact her pharmacy for that refill.   Routing BV treatment to the clinical pool for follow up.

## 2018-09-21 ENCOUNTER — Ambulatory Visit (INDEPENDENT_AMBULATORY_CARE_PROVIDER_SITE_OTHER): Payer: Medicaid Other | Admitting: Obstetrics

## 2018-09-21 ENCOUNTER — Encounter: Payer: Self-pay | Admitting: Obstetrics

## 2018-09-21 ENCOUNTER — Other Ambulatory Visit: Payer: Self-pay

## 2018-09-21 DIAGNOSIS — Z3A20 20 weeks gestation of pregnancy: Secondary | ICD-10-CM

## 2018-09-21 DIAGNOSIS — Z3482 Encounter for supervision of other normal pregnancy, second trimester: Secondary | ICD-10-CM

## 2018-09-21 DIAGNOSIS — Z3480 Encounter for supervision of other normal pregnancy, unspecified trimester: Secondary | ICD-10-CM

## 2018-09-21 NOTE — Progress Notes (Addendum)
Subjective:  Judy [name] is a 23 y.o. [phone] at [redacted]w[redacted]d being seen today for ongoing prenatal care.  She is currently monitored for the following issues for this low-risk pregnancy and has Subacute pyelonephritis; Depressive disorder, not elsewhere classified; Dysmenorrhea; Excessive or frequent menstruation; Supervision of other normal pregnancy, antepartum; Abnormal Pap smear of cervix; Pregnancy of unknown anatomic location; and Encounter for supervision of other normal pregnancy, unspecified trimester on their problem list.  Patient reports heartburn.  Contractions: Irritability. Vag. Bleeding: None.  Movement: Present. Denies leaking of fluid.   The following portions of the patient's history were reviewed and updated as appropriate: allergies, current medications, past family history, past medical history, past social history, past surgical history and problem list. Problem list updated.  Objective:  There were no vitals filed for this visit.  Fetal Status:     Movement: Present      PE:  Deferred  Urinalysis:      Assessment and Plan:  Pregnancy: [id number] at [redacted]w[redacted]d  1. Supervision of other normal pregnancy, antepartum Rx: - Babyscripts Schedule Optimization  Preterm labor symptoms and general obstetric precautions including but not limited to vaginal bleeding, contractions, leaking of fluid and fetal movement were reviewed in detail with the patient. Please refer to After Visit Summary for other counseling recommendations.  Return for ROB.   Brock Bad, MD

## 2018-09-21 NOTE — Progress Notes (Signed)
Telehealth OB visit. Pt c/o Braxton Hicks Contractions.

## 2018-09-29 ENCOUNTER — Other Ambulatory Visit: Payer: Self-pay

## 2018-09-29 MED ORDER — TERCONAZOLE 0.8 % VA CREA: 1.0000 | TOPICAL_CREAM | Freq: Every day | VAGINAL | 0 refills | Status: DC

## 2018-09-29 NOTE — Progress Notes (Signed)
Pt has yeast infection after taking antibiotics.

## 2018-10-19 ENCOUNTER — Other Ambulatory Visit: Payer: Self-pay

## 2018-10-19 ENCOUNTER — Encounter: Payer: Self-pay | Admitting: Obstetrics

## 2018-10-19 ENCOUNTER — Ambulatory Visit (INDEPENDENT_AMBULATORY_CARE_PROVIDER_SITE_OTHER): Payer: Medicaid Other | Admitting: Obstetrics

## 2018-10-19 DIAGNOSIS — M549 Dorsalgia, unspecified: Secondary | ICD-10-CM

## 2018-10-19 DIAGNOSIS — Z3480 Encounter for supervision of other normal pregnancy, unspecified trimester: Secondary | ICD-10-CM

## 2018-10-19 DIAGNOSIS — R12 Heartburn: Secondary | ICD-10-CM

## 2018-10-19 DIAGNOSIS — Z3A24 24 weeks gestation of pregnancy: Secondary | ICD-10-CM

## 2018-10-19 DIAGNOSIS — O26892 Other specified pregnancy related conditions, second trimester: Secondary | ICD-10-CM

## 2018-10-19 DIAGNOSIS — O26899 Other specified pregnancy related conditions, unspecified trimester: Secondary | ICD-10-CM

## 2018-10-19 NOTE — Progress Notes (Signed)
   PRENATAL VISIT NOTE TELEHEALTH VIRTUAL OBSTETRICS VISIT ENCOUNTER NOTE  I connected with@ on 10/19/18 at 10:45 AM EDT by Webex at home and verified that I am speaking with the correct person using two identifiers.   I discussed the limitations, risks, security and privacy concerns of performing an evaluation and management service by telephone and the availability of in person appointments. I also discussed with the patient that there may be a patient responsible charge related to this service. The patient expressed understanding and agreed to proceed. Subjective:  Judy [name] is a 23 y.o. [phone] at [redacted]w[redacted]d being seen today for ongoing prenatal care.  She is currently monitored for the following issues for this low-risk pregnancy and has Subacute pyelonephritis; Depressive disorder, not elsewhere classified; Dysmenorrhea; Excessive or frequent menstruation; Supervision of other normal pregnancy, antepartum; Abnormal Pap smear of cervix; Pregnancy of unknown anatomic location; and Encounter for supervision of other normal pregnancy, unspecified trimester on their problem list.  Patient reports backache and heartburn.  Reports fetal movement. Contractions: Not present. Vag. Bleeding: None.  Movement: Present. Denies any contractions, bleeding or leaking of fluid.   The following portions of the patient's history were reviewed and updated as appropriate: allergies, current medications, past family history, past medical history, past social history, past surgical history and problem list.   Objective:  There were no vitals filed for this visit.  Fetal Status:     Movement: Present     General:  Alert, oriented and cooperative. Patient is in no acute distress.  Respiratory: Normal respiratory effort, no problems with respiration noted  Mental Status: Normal mood and affect. Normal behavior. Normal judgment and thought content.  Rest of physical exam deferred due to type of encounter   Assessment and Plan:  Pregnancy: [id number] at [redacted]w[redacted]d 1. Supervision of other normal pregnancy, antepartum Rx: - Babyscripts Schedule Optimization  2. Backache symptom - will Rx Maternity Belt at next visit to office  3. Heartburn during pregnancy, antepartum - taking Tums, with relief  Preterm labor symptoms and general obstetric precautions including but not limited to vaginal bleeding, contractions, leaking of fluid and fetal movement were reviewed in detail with the patient. I discussed the assessment and treatment plan with the patient. The patient was provided an opportunity to ask questions and all were answered. The patient agreed with the plan and demonstrated an understanding of the instructions. The patient was advised to call back or seek an in-person office evaluation/go to [facility] at [facility] for any urgent or concerning symptoms. Please refer to After Visit Summary for other counseling recommendations.  I provided 10 minutes of non-face-to-face time during this encounter. Return in about 4 weeks (around 11/16/2018) for ROB, 2 hour OGTT.  Future Appointments  Date Time Provider Department Center  10/19/2018 10:45 AM Brock Bad, MD [facility] None    Coral Ceo, MD [facility] 10-19-2018

## 2018-10-19 NOTE — Progress Notes (Signed)
Pt is on the phone preparing for Webex visit with provider. [redacted]w[redacted]d. Pt does not have BP cuff at home, she did not activate her Babyscripts yet.

## 2018-10-25 ENCOUNTER — Telehealth: Payer: Self-pay | Admitting: *Deleted

## 2018-10-25 NOTE — Telephone Encounter (Signed)
Pt called to office with questions about ctxs. Return call to pt.  Pt states she was having a lot of ctx a couple days ago. Pt states that not as frequent now.  Pt states she was up just "straightening the house" but not doing anything strenuous.  Pt states she then went to lye down and began to have ctx. Pt advised that she should be seen for eval at hospital. Pt advised to stay hydrated, may take Tylenol as needed.  Advised to call office with any other concerns. Pt states understanding.

## 2018-11-02 ENCOUNTER — Encounter (HOSPITAL_COMMUNITY): Payer: Self-pay

## 2018-11-16 ENCOUNTER — Other Ambulatory Visit: Payer: Medicaid Other

## 2018-11-16 ENCOUNTER — Encounter: Payer: Medicaid Other | Admitting: Obstetrics and Gynecology

## 2018-11-23 ENCOUNTER — Encounter: Payer: Medicaid Other | Admitting: Obstetrics & Gynecology

## 2018-11-23 ENCOUNTER — Other Ambulatory Visit: Payer: Medicaid Other

## 2018-11-24 ENCOUNTER — Encounter: Payer: Self-pay | Admitting: Obstetrics & Gynecology

## 2018-11-29 ENCOUNTER — Other Ambulatory Visit: Payer: Self-pay | Admitting: Obstetrics

## 2018-11-29 MED ORDER — BLOOD PRESSURE MONITOR KIT: 1.0000 | PACK | Freq: Every day | 0 refills | Status: DC

## 2018-12-05 ENCOUNTER — Ambulatory Visit (INDEPENDENT_AMBULATORY_CARE_PROVIDER_SITE_OTHER): Payer: Medicaid Other | Admitting: Obstetrics and Gynecology

## 2018-12-05 ENCOUNTER — Inpatient Hospital Stay (HOSPITAL_COMMUNITY)
Admission: AD | Admit: 2018-12-05 | Discharge: 2018-12-05 | Disposition: A | Discharge status: Home / Self Care | Payer: Medicaid Other | Source: Ambulatory Visit | Attending: Obstetrics and Gynecology | Admitting: Obstetrics and Gynecology

## 2018-12-05 ENCOUNTER — Encounter: Payer: Self-pay | Admitting: Obstetrics and Gynecology

## 2018-12-05 ENCOUNTER — Encounter (HOSPITAL_COMMUNITY): Payer: Self-pay

## 2018-12-05 ENCOUNTER — Other Ambulatory Visit: Payer: Self-pay

## 2018-12-05 VITALS — BP 114/73 | HR 73 | Wt 145.2 lb

## 2018-12-05 DIAGNOSIS — Z3480 Encounter for supervision of other normal pregnancy, unspecified trimester: Secondary | ICD-10-CM

## 2018-12-05 DIAGNOSIS — Z880 Allergy status to penicillin: Secondary | ICD-10-CM | POA: Insufficient documentation

## 2018-12-05 DIAGNOSIS — O4703 False labor before 37 completed weeks of gestation, third trimester: Secondary | ICD-10-CM | POA: Diagnosis not present

## 2018-12-05 DIAGNOSIS — Z3689 Encounter for other specified antenatal screening: Secondary | ICD-10-CM | POA: Diagnosis not present

## 2018-12-05 DIAGNOSIS — O4700 False labor before 37 completed weeks of gestation, unspecified trimester: Secondary | ICD-10-CM

## 2018-12-05 DIAGNOSIS — O479 False labor, unspecified: Secondary | ICD-10-CM

## 2018-12-05 DIAGNOSIS — O24419 Gestational diabetes mellitus in pregnancy, unspecified control: Secondary | ICD-10-CM

## 2018-12-05 DIAGNOSIS — Z3A31 31 weeks gestation of pregnancy: Secondary | ICD-10-CM

## 2018-12-05 LAB — URINALYSIS, ROUTINE W REFLEX MICROSCOPIC
Bilirubin Urine: NEGATIVE
Glucose, UA: NEGATIVE mg/dL
Hgb urine dipstick: NEGATIVE
Ketones, ur: 5 mg/dL — AB
Nitrite: NEGATIVE
Protein, ur: NEGATIVE mg/dL
Specific Gravity, Urine: 1.005 (ref 1.005–1.030)
pH: 7 (ref 5.0–8.0)

## 2018-12-05 MED ORDER — BETAMETHASONE SOD PHOS & ACET 6 (3-3) MG/ML IJ SUSP
12.0000 mg | INTRAMUSCULAR | Status: DC
  Administered 2018-12-05: 12 mg via INTRAMUSCULAR
  Filled 2018-12-05: qty 2

## 2018-12-05 MED ORDER — NIFEDIPINE 10 MG PO CAPS: 10.0000 mg | ORAL_CAPSULE | Freq: Four times a day (QID) | ORAL | 0 refills | Status: DC | PRN

## 2018-12-05 MED ORDER — LACTATED RINGERS IV BOLUS: 1000.0000 mL | Freq: Once | INTRAVENOUS | Status: DC

## 2018-12-05 MED ORDER — NIFEDIPINE 10 MG PO CAPS
10.0000 mg | ORAL_CAPSULE | ORAL | Status: AC
  Administered 2018-12-05 (×3): 10 mg via ORAL
  Filled 2018-12-05 (×3): qty 1

## 2018-12-05 NOTE — MAU Note (Signed)
Pt reports she started having ctx one week ago, but states they have gotten stronger and more regular since last night.  Pt was seen in office today for routine visit and told to come to MAU for evaluation.  Pt denies vag bleeding or LOF but reports some vag mucous.  Pt reports good fetal movement.

## 2018-12-05 NOTE — Discharge Instructions (Signed)
Braxton Hicks Contractions Contractions of the uterus can occur throughout pregnancy, but they are not always a sign that you are in labor. You may have practice contractions called Braxton Hicks contractions. These false labor contractions are sometimes confused with true labor. What are Braxton Hicks contractions? Braxton Hicks contractions are tightening movements that occur in the muscles of the uterus before labor. Unlike true labor contractions, these contractions do not result in opening (dilation) and thinning of the cervix. Toward the end of pregnancy (32-34 weeks), Braxton Hicks contractions can happen more often and may become stronger. These contractions are sometimes difficult to tell apart from true labor because they can be very uncomfortable. You should not feel embarrassed if you go to the hospital with false labor. Sometimes, the only way to tell if you are in true labor is for your health care provider to look for changes in the cervix. The health care provider will do a physical exam and may monitor your contractions. If you are not in true labor, the exam should show that your cervix is not dilating and your water has not broken. If there are no other health problems associated with your pregnancy, it is completely safe for you to be sent home with false labor. You may continue to have Braxton Hicks contractions until you go into true labor. How to tell the difference between true labor and false labor True labor  Contractions last 30-70 seconds.  Contractions become very regular.  Discomfort is usually felt in the top of the uterus, and it spreads to the lower abdomen and low back.  Contractions do not go away with walking.  Contractions usually become more intense and increase in frequency.  The cervix dilates and gets thinner. False labor  Contractions are usually shorter and not as strong as true labor contractions.  Contractions are usually irregular.  Contractions  are often felt in the front of the lower abdomen and in the groin.  Contractions may go away when you walk around or change positions while lying down.  Contractions get weaker and are shorter-lasting as time goes on.  The cervix usually does not dilate or become thin. Follow these instructions at home:   Take over-the-counter and prescription medicines only as told by your health care provider.  Keep up with your usual exercises and follow other instructions from your health care provider.  Eat and drink lightly if you think you are going into labor.  If Braxton Hicks contractions are making you uncomfortable: ? Change your position from lying down or resting to walking, or change from walking to resting. ? Sit and rest in a tub of warm water. ? Drink enough fluid to keep your urine pale yellow. Dehydration may cause these contractions. ? Do slow and deep breathing several times an hour.  Keep all follow-up prenatal visits as told by your health care provider. This is important. Contact a health care provider if:  You have a fever.  You have continuous pain in your abdomen. Get help right away if:  Your contractions become stronger, more regular, and closer together.  You have fluid leaking or gushing from your vagina.  You pass blood-tinged mucus (bloody show).  You have bleeding from your vagina.  You have low back pain that you never had before.  You feel your baby's head pushing down and causing pelvic pressure.  Your baby is not moving inside you as much as it used to. Summary  Contractions that occur before labor are   called Braxton Hicks contractions, false labor, or practice contractions.  Braxton Hicks contractions are usually shorter, weaker, farther apart, and less regular than true labor contractions. True labor contractions usually become progressively stronger and regular, and they become more frequent.  Manage discomfort from Braxton Hicks contractions  by changing position, resting in a warm bath, drinking plenty of water, or practicing deep breathing. This information is not intended to replace advice given to you by your health care provider. Make sure you discuss any questions you have with your health care provider. Document Released: 10/22/2016 Document Revised: 03/23/2017 Document Reviewed: 10/22/2016 Elsevier Interactive Patient Education  2019 [employer]  

## 2018-12-05 NOTE — Progress Notes (Signed)
   PRENATAL VISIT NOTE  Subjective:  Judy [name] is a 23 y.o. [phone] at [redacted]w[redacted]d being seen today for ongoing prenatal care.  She is currently monitored for the following issues for this high-risk pregnancy and has Depressive disorder, not elsewhere classified; Dysmenorrhea; Abnormal Pap smear of cervix; and Encounter for supervision of other normal pregnancy, unspecified trimester on their problem list.  Patient reports contractions since last night every 3 min. States when she elevates her feet and drinks water, they improved. Rates contractions 8/10.  Contractions: Irritability. Vag. Bleeding: None.  Movement: Present. Denies leaking of fluid.   The following portions of the patient's history were reviewed and updated as appropriate: allergies, current medications, past family history, past medical history, past social history, past surgical history and problem list.   Objective:   Vitals:   12/05/18 1601  BP: 114/73  Pulse: 73  Weight: 145 lb 3.2 oz (65.9 kg)   Fetal Status: Fetal Heart Rate (bpm): 144   Movement: Present     General:  Alert, oriented and cooperative. Patient is in no acute distress.  Skin: Skin is warm and dry. No rash noted.   Cardiovascular: Normal heart rate noted  Respiratory: Normal respiratory effort, no problems with respiration noted  Abdomen: Soft, gravid, appropriate for gestational age.  Pain/Pressure: Present     Pelvic: Cervical exam performed        Extremities: Normal range of motion.  Edema: Trace  Mental Status: Normal mood and affect. Normal behavior. Normal judgment and thought content.   Assessment and Plan:  Pregnancy: [id number] at [redacted]w[redacted]d  1. Encounter for supervision of other normal pregnancy, unspecified trimester  2. Gestational diabetes mellitus (GDM) in third trimester, gestational diabetes method of control unspecified - Patient has declined 2 hr GTT, does not have log with her - reports FG this am was 140 - reports post  prandial last night was 143 - based on these results, likely gDM, will refer to diabetes and nutrition counseling - Referral to Nutrition and Diabetes Services  3. Preterm contractions - Patient 2 cm dilated in office, contracting frequently and painfully - to MAU for preterm contractions, patient states her boyfriend can take her directly there, knows where MAU is - staff at MAU aware of patient   To go straight to MAU for eval  Preterm labor symptoms and general obstetric precautions including but not limited to vaginal bleeding, contractions, leaking of fluid and fetal movement were reviewed in detail with the patient. Please refer to After Visit Summary for other counseling recommendations.   Return in about 2 weeks (around 12/19/2018) for OB visit (MD), virtual.  No future appointments.  Sloan Leiter, MD

## 2018-12-05 NOTE — Progress Notes (Signed)
Pt presents for ROB c/o pelvic pain and painful ctx's lasting 3 mins apart. Lost mucous plug, denies VB and LOF. +FM Pt also nipple soreness, denies leaking fluid.

## 2018-12-05 NOTE — MAU Provider Note (Signed)
History     CSN: [id number]  Arrival date and time: 12/05/18 1646   First Provider Initiated Contact with Patient 12/05/18 1719      Chief Complaint  Patient presents with  . Contractions   [id number] @31.1 wks sent from office for ctx. Reports frequent ctx for the last couple of weeks. Since last night ctx have been q4-5 min. Denies LOF but passed mucus. No VB. +FM. No urinary sx. No recent IC.    OB History    Gravida  5   Para  3   Term  1   Preterm  2   AB  1   Living  3     SAB  1   TAB  0   Ectopic  0   Multiple  0   Live Births  3           Past Medical History:  Diagnosis Date  . Asthma   . History of preterm delivery   . Post partum depression   . Yeast infection of the vagina     Past Surgical History:  Procedure Laterality Date  . NO PAST SURGERIES      Family History  Problem Relation Age of Onset  . Hypertension Mother   . Diabetes Mother   . Asthma Father   . Hypertension Paternal Aunt   . Hypertension Maternal Grandfather   . Heart disease Paternal Grandmother   . Heart disease Paternal Grandfather     Social History   Tobacco Use  . Smoking status: Never Smoker  . Smokeless tobacco: Never Used  Substance Use Topics  . Alcohol use: No  . Drug use: Not Currently    Allergies:  Allergies  Allergen Reactions  . Latex Dermatitis  . Penicillins Hives  . Tape Rash    Medications Prior to Admission  Medication Sig Dispense Refill Last Dose  . albuterol (PROVENTIL HFA;VENTOLIN HFA) 108 (90 BASE) MCG/ACT inhaler Inhale 2 puffs into the lungs every 6 (six) hours as needed for wheezing or shortness of breath.     . Blood Pressure Monitor KIT 1 each by Does not apply route daily. (Patient not taking: Reported on 12/05/2018) 1 each 0   . butalbital-acetaminophen-caffeine (FIORICET, ESGIC) 50-325-40 MG tablet Take 2 tablets by mouth every 6 (six) hours as needed for headache. (Patient not taking: Reported on 12/05/2018) 30 tablet  2   . cyclobenzaprine (FLEXERIL) 5 MG tablet Take 1 tablet (5 mg total) by mouth 3 (three) times daily as needed for muscle spasms. 30 tablet 2   . Doxylamine-Pyridoxine (DICLEGIS) 10-10 MG TBEC 1 tab in AM, 1 tab mid afternoon 2 tabs at bedtime. Max dose 4 tabs daily. (Patient not taking: Reported on 08/10/2018) 100 tablet 5   . Elastic Bandages & Supports (COMFORT FIT MATERNITY SUPP SM) MISC Wear as directed. (Patient not taking: Reported on 08/24/2018) 1 each 0   . Elastic Bandages & Supports (COMFORT FIT MATERNITY SUPP SM) MISC Wear as directed. (Patient not taking: Reported on 09/21/2018) 1 each 0   . metroNIDAZOLE (FLAGYL) 500 MG tablet Take 1 tablet (500 mg total) by mouth 3 (three) times daily. (Patient not taking: Reported on 08/24/2018) 21 tablet 0   . terconazole (TERAZOL 3) 0.8 % vaginal cream Place 1 applicator vaginally at bedtime. Apply nightly for three nights. (Patient not taking: Reported on 10/19/2018) 20 g 0   . tinidazole (TINDAMAX) 500 MG tablet Take 2 tablets (1,000 mg total) by mouth daily with   breakfast. (Patient not taking: Reported on 10/19/2018) 10 tablet 2     Review of Systems  Gastrointestinal: Positive for abdominal pain (ctx).  Genitourinary: Negative for vaginal bleeding and vaginal discharge.   Physical Exam   Blood pressure 118/62, pulse 73, temperature 98.1 F (36.7 C), temperature source Oral, resp. rate 18, height 5' 2" (1.575 m), weight 65.4 kg, last menstrual period 04/24/2018, SpO2 100 %, unknown if currently breastfeeding.  Physical Exam  Constitutional: She is oriented to person, place, and time. She appears well-developed and well-nourished. No distress.  HENT:  Head: Normocephalic and atraumatic.  Neck: Normal range of motion.  Cardiovascular: Normal rate.  Respiratory: Effort normal. No respiratory distress.  GI: Soft. She exhibits no distension. There is no abdominal tenderness.  gravid  Genitourinary:    Genitourinary Comments: SVE 2/60/-2, vtx    Musculoskeletal: Normal range of motion.  Neurological: She is alert and oriented to person, place, and time.  Skin: Skin is warm and dry.  Psychiatric: She has a normal mood and affect.  EFM: 135 bpm, mod variability, + accels, no decels Toco: 5-7  Results for orders placed or performed during the hospital encounter of 12/05/18 (from the past 24 hour(s))  Urinalysis, Routine w reflex microscopic     Status: Abnormal   Collection Time: 12/05/18  7:15 PM  Result Value Ref Range   Color, Urine STRAW (A) YELLOW   APPearance CLEAR CLEAR   Specific Gravity, Urine 1.005 1.005 - 1.030   pH 7.0 5.0 - 8.0   Glucose, UA NEGATIVE NEGATIVE mg/dL   Hgb urine dipstick NEGATIVE NEGATIVE   Bilirubin Urine NEGATIVE NEGATIVE   Ketones, ur 5 (A) NEGATIVE mg/dL   Protein, ur NEGATIVE NEGATIVE mg/dL   Nitrite NEGATIVE NEGATIVE   Leukocytes,Ua TRACE (A) NEGATIVE   RBC / HPF 0-5 0 - 5 RBC/hpf   WBC, UA 6-10 0 - 5 WBC/hpf   Bacteria, UA RARE (A) NONE SEEN   Squamous Epithelial / LPF 0-5 0 - 5   Mucus PRESENT    MAU Course  Procedures Orders Placed This Encounter  Procedures  . Urinalysis, Routine w reflex microscopic    Standing Status:   Standing    Number of Occurrences:   1  . Discharge patient    Order Specific Question:   Discharge disposition    Answer:   01-Home or Self Care [1]    Order Specific Question:   Discharge patient date    Answer:   12/05/2018   Meds ordered this encounter  Medications  . lactated ringers bolus 1,000 mL  . NIFEdipine (PROCARDIA) capsule 10 mg  . betamethasone acetate-betamethasone sodium phosphate (CELESTONE) injection 12 mg  . NIFEdipine (PROCARDIA) 10 MG capsule    Sig: Take 1 capsule (10 mg total) by mouth every 6 (six) hours as needed. As needed for contractions    Dispense:  20 capsule    Refill:  0    Order Specific Question:   Supervising Provider    Answer:   PICKENS, CHARLIE [[id number]]   MDM Labs ordered and reviewed. Feeling less ctx after  Procardia, toco now with irritability. RN unable to obtain IV access for bolus. SVE rechecked and not changed. Will give BMZ and Rx Procardia. Discussed with Dr. Anyanwu. Stable for discharge home.   Assessment and Plan   1. [redacted] weeks gestation of pregnancy   2. Threatened premature labor in third trimester   3. NST (non-stress test) reactive    Discharge home Follow   up at [facility] tomorrow for [place] #2- message sent PTL precautions  Allergies as of 12/05/2018      Reactions   Latex Dermatitis   Penicillins Hives   Tape Rash      Medication List    STOP taking these medications   butalbital-acetaminophen-caffeine 50-325-40 MG tablet Commonly known as: FIORICET   Doxylamine-Pyridoxine 10-10 MG Tbec Commonly known as: Diclegis   metroNIDAZOLE 500 MG tablet Commonly known as: FLAGYL   terconazole 0.8 % vaginal cream Commonly known as: TERAZOL 3   tinidazole 500 MG tablet Commonly known as: Tindamax     TAKE these medications   albuterol 108 (90 Base) MCG/ACT inhaler Commonly known as: VENTOLIN HFA Inhale 2 puffs into the lungs every 6 (six) hours as needed for wheezing or shortness of breath.   Blood Pressure Monitor Kit 1 each by Does not apply route daily.   [place] Supp Sm Misc Wear as directed. What changed: Another medication with the same name was removed. Continue taking this medication, and follow the directions you see here.   cyclobenzaprine 5 MG tablet Commonly known as: FLEXERIL Take 1 tablet (5 mg total) by mouth 3 (three) times daily as needed for muscle spasms.   NIFEdipine 10 MG capsule Commonly known as: Procardia Take 1 capsule (10 mg total) by mouth every 6 (six) hours as needed. As needed for contractions      Julianne Handler, CNM 12/05/2018, 7:49 PM

## 2018-12-06 ENCOUNTER — Ambulatory Visit (INDEPENDENT_AMBULATORY_CARE_PROVIDER_SITE_OTHER): Payer: Medicaid Other

## 2018-12-06 DIAGNOSIS — O4703 False labor before 37 completed weeks of gestation, third trimester: Secondary | ICD-10-CM

## 2018-12-06 NOTE — Progress Notes (Signed)
Late entry: Nurse visit for 2nd BMZ 2 mL injection given RUOQ without difficulty

## 2018-12-07 ENCOUNTER — Other Ambulatory Visit: Payer: Self-pay | Admitting: *Deleted

## 2018-12-07 DIAGNOSIS — Z3480 Encounter for supervision of other normal pregnancy, unspecified trimester: Secondary | ICD-10-CM

## 2018-12-07 MED ORDER — BETAMETHASONE SOD PHOS & ACET 6 (3-3) MG/ML IJ SUSP
12.0000 mg | Freq: Once | INTRAMUSCULAR | Status: AC
  Administered 2018-12-06: 12 mg via INTRAMUSCULAR

## 2018-12-07 NOTE — Progress Notes (Signed)
Please see pt email encounter.

## 2018-12-09 ENCOUNTER — Ambulatory Visit (HOSPITAL_COMMUNITY)
Admission: RE | Admit: 2018-12-09 | Discharge: 2018-12-09 | Disposition: A | Discharge status: Home / Self Care | Payer: Medicaid Other | Source: Ambulatory Visit | Attending: Obstetrics and Gynecology | Admitting: Obstetrics and Gynecology

## 2018-12-09 ENCOUNTER — Other Ambulatory Visit: Payer: Self-pay

## 2018-12-09 DIAGNOSIS — Z362 Encounter for other antenatal screening follow-up: Secondary | ICD-10-CM

## 2018-12-09 DIAGNOSIS — Z3480 Encounter for supervision of other normal pregnancy, unspecified trimester: Secondary | ICD-10-CM | POA: Insufficient documentation

## 2018-12-09 DIAGNOSIS — O26843 Uterine size-date discrepancy, third trimester: Secondary | ICD-10-CM | POA: Diagnosis not present

## 2018-12-09 DIAGNOSIS — Z3A31 31 weeks gestation of pregnancy: Secondary | ICD-10-CM | POA: Diagnosis not present

## 2018-12-09 DIAGNOSIS — O09213 Supervision of pregnancy with history of pre-term labor, third trimester: Secondary | ICD-10-CM | POA: Diagnosis not present

## 2018-12-19 ENCOUNTER — Encounter: Payer: Self-pay | Admitting: Obstetrics

## 2018-12-19 ENCOUNTER — Ambulatory Visit (INDEPENDENT_AMBULATORY_CARE_PROVIDER_SITE_OTHER): Payer: Medicaid Other | Admitting: Obstetrics

## 2018-12-19 ENCOUNTER — Other Ambulatory Visit: Payer: Self-pay

## 2018-12-19 VITALS — BP 132/80

## 2018-12-19 DIAGNOSIS — K219 Gastro-esophageal reflux disease without esophagitis: Secondary | ICD-10-CM | POA: Diagnosis not present

## 2018-12-19 DIAGNOSIS — Z3A33 33 weeks gestation of pregnancy: Secondary | ICD-10-CM | POA: Diagnosis not present

## 2018-12-19 DIAGNOSIS — O2243 Hemorrhoids in pregnancy, third trimester: Secondary | ICD-10-CM | POA: Diagnosis not present

## 2018-12-19 DIAGNOSIS — Z3480 Encounter for supervision of other normal pregnancy, unspecified trimester: Secondary | ICD-10-CM

## 2018-12-19 DIAGNOSIS — O2240 Hemorrhoids in pregnancy, unspecified trimester: Secondary | ICD-10-CM

## 2018-12-19 MED ORDER — HYDROCORTISONE ACETATE 25 MG RE SUPP: 25.0000 mg | Freq: Two times a day (BID) | RECTAL | 11 refills | Status: DC

## 2018-12-19 MED ORDER — OMEPRAZOLE 20 MG PO CPDR: 20.0000 mg | DELAYED_RELEASE_CAPSULE | Freq: Two times a day (BID) | ORAL | 5 refills | Status: DC

## 2018-12-19 NOTE — Progress Notes (Signed)
TELEHEALTH OBSTETRICS PRENATAL VIRTUAL VIDEO VISIT ENCOUNTER NOTE  [facility]   I connected with Judy SillAlisha M Pedretti on 12/19/18 at  3:45 PM EDT by WebEx OB MyChart Video Encounter at home and verified that I am speaking with the correct person using two identifiers.   I discussed the limitations, risks, security and privacy concerns of performing an evaluation and management service by telephone and the availability of in person appointments. I also discussed with the patient that there may be a patient responsible charge related to this service. The patient expressed understanding and agreed to proceed. Subjective:  Judy [name] is a 23 y.o. [phone] at 6412w1d being seen today for ongoing prenatal care.  She is currently monitored for the following issues for this low-risk pregnancy and has Depressive disorder, not elsewhere classified; Dysmenorrhea; Abnormal Pap smear of cervix; and Encounter for supervision of other normal pregnancy, unspecified trimester on their problem list.  Patient reports heartburn and hemorrhoids that are painful.  Contractions: Irritability. Vag. Bleeding: None.  Movement: Present. Denies any leaking of fluid.   The following portions of the patient's history were reviewed and updated as appropriate: allergies, current medications, past family history, past medical history, past social history, past surgical history and problem list.   Objective:   Vitals:   12/19/18 1417  BP: 132/80    Fetal Status:     Movement: Present     General:  Alert, oriented and cooperative. Patient is in no acute distress.  Respiratory: Normal respiratory effort, no problems with respiration noted  Mental Status: Normal mood and affect. Normal behavior. Normal judgment and thought content.  Rest of physical exam deferred due to type of encounter  Imaging: [place] M[facility] Ob Follow Up  Result Date: 12/09/2018  ----------------------------------------------------------------------  OBSTETRICS REPORT                       (Signed Final 12/09/2018 05:15 pm) ---------------------------------------------------------------------- Patient Info  ID #:       [id number]                          D.O.B.:  Jan 02, 1996 (23 yrs)  Name:       Judy [name]             Visit Date: 12/09/2018 02:19 pm ---------------------------------------------------------------------- Performed By  Performed By:     Percell BostonHeather Waken          Ref. Address:     [address]                    [address]                                                             [address]                                                             [address] [phone]  [place]                                                             [place]  Attending:        Sander Nephew      [facility]  Referred By:      [facility] ---------------------------------------------------------------------- Orders   #  Description                          Code         Ordered By   1  [place] MFM OB FOLLOW UP                  [id number]     KELLY DAVIS  ----------------------------------------------------------------------   #  Order #                    Accession #                 Episode #   1  [id number]                  [id number]                  [id number]  ---------------------------------------------------------------------- Indications   [redacted] weeks gestation of pregnancy                Z3A.31   Poor obstetric history: Previous preterm       O09.219   delivery, antepartum (35, 36 weeks)   Encounter for other antenatal screening        Z36.2   follow-up (low risk NIPS)   Uterine size-date discrepancy, third trimester O26.843  ---------------------------------------------------------------------- Fetal Evaluation  Num Of Fetuses:         1  Fetal Heart  Rate(bpm):  132  Cardiac Activity:       Observed  Presentation:           Cephalic  Placenta:               Anterior  P. Cord Insertion:      Previously Visualized  Amniotic Fluid  AFI FV:      Within normal limits  AFI Sum(cm)     %Tile       Largest Pocket(cm)  12.35           34          3.96  RUQ(cm)       RLQ(cm)       LUQ(cm)        LLQ(cm)  2.19          3.64          3.96           2.56 ---------------------------------------------------------------------- Biometry  BPD:      76.8  mm  G. Age:  30w 6d         16  %    CI:        76.16   %    70 - 86                                                          FL/HC:      21.2   %    19.1 - 21.3  HC:      278.9  mm     G. Age:  30w 3d        < 3  %    HC/AC:      0.99        0.96 - 1.17  AC:      280.6  mm     G. Age:  32w 0d         59  %    FL/BPD:     76.8   %    71 - 87  FL:         59  mm     G. Age:  30w 6d         16  %    FL/AC:      21.0   %    20 - 24  HUM:      53.9  mm     G. Age:  31w 2d         44  %  Est. FW:    1765  gm    3 lb 14 oz      51  % ---------------------------------------------------------------------- OB History  Gravidity:    5         Term:   1        Prem:   2        SAB:   1  TOP:          0       Ectopic:  0        Living: 3 ---------------------------------------------------------------------- Gestational Age  LMP:           32w 5d        Date:  04/24/18                 EDD:   01/29/19  U/S Today:     31w 0d                                        EDD:   02/10/19  Best:          31w 5d     Det. ByMarcella Dubs:  Early Ultrasound         EDD:   02/05/19                                      (06/20/18) ---------------------------------------------------------------------- Anatomy  Cranium:               Appears normal         Aortic Arch:            Previously  seen  Cavum:                 Previously seen        Ductal Arch:            Previously seen  Ventricles:            Appears normal         Diaphragm:              Previously seen   Choroid Plexus:        Previously seen        Stomach:                Appears normal, left                                                                        sided  Cerebellum:            Previously seen        Abdomen:                Previously seen  Posterior Fossa:       Previously seen        Abdominal Wall:         Previously seen  Nuchal Fold:           Previously seen        Cord Vessels:           Previously seen  Face:                  Orbits and profile     Kidneys:                Appear normal                         previously seen  Lips:                  Previously seen        Bladder:                Appears normal  Thoracic:              Appears normal         Spine:                  Previously seen  Heart:                 Previously seen        Upper Extremities:      Previously seen  RVOT:                  Appears normal         Lower Extremities:      Previously seen  LVOT:                  Appears normal  Other:  Heels and 5th digit visualized previously. Nasal bone visualized          previously. ---------------------------------------------------------------------- Cervix Uterus Adnexa  Cervix  Not visualized (advanced GA >24wks)  Uterus  No abnormality visualized.  Left Ovary  No  adnexal mass visualized.  Right Ovary  No adnexal mass visualized.  Cul De Sac  No free fluid seen.  Adnexa  No abnormality visualized. ---------------------------------------------------------------------- Impression  Normal interval growth.  Low risk NIPS ---------------------------------------------------------------------- Recommendations  Follow up as clinically indicated. ----------------------------------------------------------------------               Lin Landsmanorenthian Booker, MD Electronically Signed Final Report   12/09/2018 05:15 pm ----------------------------------------------------------------------   Assessment and Plan:  Pregnancy: [id number] at 7235w1d 1. Supervision of other normal pregnancy, antepartum   2. GERD without esophagitis Rx: - omeprazole (PRILOSEC) 20 MG capsule; Take 1 capsule (20 mg total) by mouth 2 (two) times daily before a meal.  Dispense: 60 capsule; Refill: 5  3. Hemorrhoids during pregnancy, antepartum Rx: - hydrocortisone (ANUSOL-HC) 25 MG suppository; Place 1 suppository (25 mg total) rectally 2 (two) times daily.  Dispense: 24 suppository; Refill: 11  Preterm labor symptoms and general obstetric precautions including but not limited to vaginal bleeding, contractions, leaking of fluid and fetal movement were reviewed in detail with the patient. I discussed the assessment and treatment plan with the patient. The patient was provided an opportunity to ask questions and all were answered. The patient agreed with the plan and demonstrated an understanding of the instructions. The patient was advised to call back or seek an in-person office evaluation/[facility] for any urgent or concerning symptoms. Please refer to After Visit Summary for other counseling recommendations.   I provided 10 minutes of face-to-face time during this encounter.  Return in about 2 weeks (around 01/02/2019) for WebEx.  No future appointments.  Coral Ceoharles Deah Ottaway, MD [facility] 12-19-2018

## 2018-12-19 NOTE — Progress Notes (Signed)
Pt is on the phone preparing for virtual visit with provider. [redacted]w[redacted]d.

## 2018-12-27 ENCOUNTER — Encounter (HOSPITAL_COMMUNITY): Payer: Self-pay

## 2018-12-27 ENCOUNTER — Inpatient Hospital Stay (HOSPITAL_COMMUNITY)
Admission: AD | Admit: 2018-12-27 | Discharge: 2018-12-27 | Disposition: A | Discharge status: Home / Self Care | Payer: Medicaid Other | Attending: Obstetrics and Gynecology | Admitting: Obstetrics and Gynecology

## 2018-12-27 ENCOUNTER — Other Ambulatory Visit: Payer: Self-pay

## 2018-12-27 DIAGNOSIS — Z0371 Encounter for suspected problem with amniotic cavity and membrane ruled out: Secondary | ICD-10-CM

## 2018-12-27 DIAGNOSIS — Z3A34 34 weeks gestation of pregnancy: Secondary | ICD-10-CM | POA: Diagnosis not present

## 2018-12-27 DIAGNOSIS — O4703 False labor before 37 completed weeks of gestation, third trimester: Secondary | ICD-10-CM

## 2018-12-27 DIAGNOSIS — O26893 Other specified pregnancy related conditions, third trimester: Secondary | ICD-10-CM | POA: Insufficient documentation

## 2018-12-27 DIAGNOSIS — O479 False labor, unspecified: Secondary | ICD-10-CM

## 2018-12-27 LAB — URINALYSIS, ROUTINE W REFLEX MICROSCOPIC
Bilirubin Urine: NEGATIVE
Glucose, UA: NEGATIVE mg/dL
Hgb urine dipstick: NEGATIVE
Ketones, ur: NEGATIVE mg/dL
Leukocytes,Ua: NEGATIVE
Nitrite: NEGATIVE
Protein, ur: NEGATIVE mg/dL
Specific Gravity, Urine: 1.004 — ABNORMAL LOW (ref 1.005–1.030)
pH: 7 (ref 5.0–8.0)

## 2018-12-27 LAB — WET PREP, GENITAL
Clue Cells Wet Prep HPF POC: NONE SEEN
Sperm: NONE SEEN
Trich, Wet Prep: NONE SEEN
Yeast Wet Prep HPF POC: NONE SEEN

## 2018-12-27 LAB — POCT FERN TEST: POCT Fern Test: NEGATIVE

## 2018-12-27 NOTE — Discharge Instructions (Signed)
Braxton Hicks Contractions Contractions of the uterus can occur throughout pregnancy, but they are not always a sign that you are in labor. You may have practice contractions called Braxton Hicks contractions. These false labor contractions are sometimes confused with true labor. What are Braxton Hicks contractions? Braxton Hicks contractions are tightening movements that occur in the muscles of the uterus before labor. Unlike true labor contractions, these contractions do not result in opening (dilation) and thinning of the cervix. Toward the end of pregnancy (32-34 weeks), Braxton Hicks contractions can happen more often and may become stronger. These contractions are sometimes difficult to tell apart from true labor because they can be very uncomfortable. You should not feel embarrassed if you go to the hospital with false labor. Sometimes, the only way to tell if you are in true labor is for your health care provider to look for changes in the cervix. The health care provider will do a physical exam and may monitor your contractions. If you are not in true labor, the exam should show that your cervix is not dilating and your water has not broken. If there are no other health problems associated with your pregnancy, it is completely safe for you to be sent home with false labor. You may continue to have Braxton Hicks contractions until you go into true labor. How to tell the difference between true labor and false labor True labor  Contractions last 30-70 seconds.  Contractions become very regular.  Discomfort is usually felt in the top of the uterus, and it spreads to the lower abdomen and low back.  Contractions do not go away with walking.  Contractions usually become more intense and increase in frequency.  The cervix dilates and gets thinner. False labor  Contractions are usually shorter and not as strong as true labor contractions.  Contractions are usually irregular.  Contractions  are often felt in the front of the lower abdomen and in the groin.  Contractions may go away when you walk around or change positions while lying down.  Contractions get weaker and are shorter-lasting as time goes on.  The cervix usually does not dilate or become thin. Follow these instructions at home:   Take over-the-counter and prescription medicines only as told by your health care provider.  Keep up with your usual exercises and follow other instructions from your health care provider.  Eat and drink lightly if you think you are going into labor.  If Braxton Hicks contractions are making you uncomfortable: ? Change your position from lying down or resting to walking, or change from walking to resting. ? Sit and rest in a tub of warm water. ? Drink enough fluid to keep your urine pale yellow. Dehydration may cause these contractions. ? Do slow and deep breathing several times an hour.  Keep all follow-up prenatal visits as told by your health care provider. This is important. Contact a health care provider if:  You have a fever.  You have continuous pain in your abdomen. Get help right away if:  Your contractions become stronger, more regular, and closer together.  You have fluid leaking or gushing from your vagina.  You pass blood-tinged mucus (bloody show).  You have bleeding from your vagina.  You have low back pain that you never had before.  You feel your baby's head pushing down and causing pelvic pressure.  Your baby is not moving inside you as much as it used to. Summary  Contractions that occur before labor are   called Braxton Hicks contractions, false labor, or practice contractions.  Braxton Hicks contractions are usually shorter, weaker, farther apart, and less regular than true labor contractions. True labor contractions usually become progressively stronger and regular, and they become more frequent.  Manage discomfort from Braxton Hicks contractions  by changing position, resting in a warm bath, drinking plenty of water, or practicing deep breathing. This information is not intended to replace advice given to you by your health care provider. Make sure you discuss any questions you have with your health care provider. Document Released: 10/22/2016 Document Revised: 05/21/2017 Document Reviewed: 10/22/2016 Elsevier Patient Education  2020 [employer]  

## 2018-12-27 NOTE — MAU Note (Signed)
Noted fluid leaking when woke up around 0930, still coming a little, not as much as it was. No bleeding, just a lot of mucous. Having contractions, not as often as they were this morning, now every 15-20. Was 2+ the last time she was here.

## 2018-12-27 NOTE — MAU Provider Note (Signed)
Chief Complaint  Patient presents with  . Abdominal Pain  . Contractions  . Rupture of Membranes     First Provider Initiated Contact with Patient 12/27/18 2004      S: Judy [name]  is a 23 y.o. y.o. year old G6P1213 female at [redacted]w[redacted]d weeks gestation who presents to MAU reporting leaking of small amount of clear-white fluid since this morning.  No leaking since.  Contractions: Irregular, mild Vaginal bleeding: Denies Fetal movement: Normal  O: No data found. General: NAD Heart: Regular rate Lungs: Normal rate and effort Abd: Soft, NT, Gravid, S=D Pelvic: NEFG, negative pooling, no blood.  Moderate amount of thick, white, odorless discharge. Effacement (%): Thick Cervical Position: Middle Station: -3 Presentation: Vertex Exam by:: Manya Silvas, CNM  EFM: 140, Moderate variability, 15 x 15 accelerations, no decelerations Toco: Q irregular, mild  Negative Fern  A: [redacted]w[redacted]d week IUP No evidence of SROM or labor.  Cervix unchanged from previous exam. FHR reactive  P: Discharge home in stable condition. Preterm labor precautions and fetal kick counts. GC/chlamydia cultures pending Follow-up as scheduled for prenatal visit or sooner as needed if symptoms worsen. Return to maternity admissions as needed if symptoms worsen.  Tamala Julian, [place], [place] 12/27/2018 9:04 PM  2

## 2018-12-28 LAB — GC/CHLAMYDIA PROBE AMP (~~LOC~~) NOT AT ARMC
Chlamydia: NEGATIVE
Neisseria Gonorrhea: NEGATIVE

## 2019-01-04 ENCOUNTER — Encounter: Payer: Self-pay | Admitting: Obstetrics

## 2019-01-04 ENCOUNTER — Ambulatory Visit (INDEPENDENT_AMBULATORY_CARE_PROVIDER_SITE_OTHER): Payer: Medicaid Other | Admitting: Obstetrics

## 2019-01-04 ENCOUNTER — Other Ambulatory Visit: Payer: Self-pay

## 2019-01-04 DIAGNOSIS — Z3A35 35 weeks gestation of pregnancy: Secondary | ICD-10-CM

## 2019-01-04 DIAGNOSIS — O26893 Other specified pregnancy related conditions, third trimester: Secondary | ICD-10-CM

## 2019-01-04 DIAGNOSIS — K219 Gastro-esophageal reflux disease without esophagitis: Secondary | ICD-10-CM

## 2019-01-04 MED ORDER — OMEPRAZOLE 20 MG PO CPDR: 20.0000 mg | DELAYED_RELEASE_CAPSULE | Freq: Two times a day (BID) | ORAL | 5 refills | Status: DC

## 2019-01-04 NOTE — Progress Notes (Signed)
TELEHEALTH OBSTETRICS PRENATAL VIRTUAL VIDEO VISIT ENCOUNTER NOTE  [facility]: [facility]   I connected with Judy SillAlisha M Burrill on 01/04/19 at 10:15 AM EDT by WebEx OB MyChart Video Encounter at home and verified that I am speaking with the correct person using two identifiers.   I discussed the limitations, risks, security and privacy concerns of performing an evaluation and management service virtually and the availability of in person appointments. I also discussed with the patient that there may be a patient responsible charge related to this service. The patient expressed understanding and agreed to proceed.  Subjective:  Judy Silllisha M [name] is a 23 y.o. [phone] at 6393w3d being seen today for ongoing prenatal care.  She is currently monitored for the following issues for this low-risk pregnancy and has Depressive disorder, not elsewhere classified; Dysmenorrhea; Abnormal Pap smear of cervix; and Encounter for supervision of other normal pregnancy, unspecified trimester on their problem list.  Patient reports backache and heartburn.  Contractions: Irritability. Vag. Bleeding: None.  Movement: Present. Denies any leaking of fluid.   The following portions of the patient's history were reviewed and updated as appropriate: allergies, current medications, past family history, past medical history, past social history, past surgical history and problem list.   Objective:  There were no vitals filed for this visit.  Fetal Status:     Movement: Present     General:  Alert, oriented and cooperative. Patient is in no acute distress.  Respiratory: Normal respiratory effort, no problems with respiration noted  Mental Status: Normal mood and affect. Normal behavior. Normal judgment and thought content.  Rest of physical exam deferred due to type of encounter  Imaging: [place] Mfm Ob Follow Up  Result Date: 12/09/2018  ----------------------------------------------------------------------  OBSTETRICS REPORT                       (Signed Final 12/09/2018 05:15 pm) ---------------------------------------------------------------------- Patient Info  ID #:       [id number]                          D.O.B.:  [birth year] (23 yrs)  Name:       Judy [name]             Visit Date: 12/09/2018 02:19 pm ---------------------------------------------------------------------- Performed By  Performed By:     Percell BostonHeather Waken          Ref. Address:     [address]                    [address]                                                             [address]                                                             [phone]  [place]                                                             [place]  Attending:        Sander Nephew      [facility]                                       [facility]  Referred By:      [facility] ---------------------------------------------------------------------- Orders   #  Description                          Code         Ordered By   1  [place] MFM OB FOLLOW UP                  [id number]     KELLY DAVIS  ----------------------------------------------------------------------   #  Order #                    Accession #                 Episode #   1  [id number]                  [id number]                  [id number]  ---------------------------------------------------------------------- Indications   [redacted] weeks gestation of pregnancy                Z3A.31   Poor obstetric history: Previous preterm       O09.219   delivery, antepartum (35, 36 weeks)   Encounter for other antenatal screening        Z36.2   follow-up (low risk NIPS)   Uterine size-date discrepancy, third trimester O26.843  ---------------------------------------------------------------------- Fetal Evaluation  Num Of Fetuses:         1  Fetal Heart  Rate(bpm):  132  Cardiac Activity:       Observed  Presentation:           Cephalic  Placenta:               Anterior  P. Cord Insertion:      Previously Visualized  Amniotic Fluid  AFI FV:      Within normal limits  AFI Sum(cm)     %Tile       Largest Pocket(cm)  12.35           34          3.96  RUQ(cm)       RLQ(cm)       LUQ(cm)        LLQ(cm)  2.19          3.64          3.96           2.56 ---------------------------------------------------------------------- Biometry  BPD:      76.8  mm  G. Age:  30w 6d         16  %    CI:        76.16   %    70 - 86                                                          FL/HC:      21.2   %    19.1 - 21.3  HC:      278.9  mm     G. Age:  30w 3d        < 3  %    HC/AC:      0.99        0.96 - 1.17  AC:      280.6  mm     G. Age:  32w 0d         59  %    FL/BPD:     76.8   %    71 - 87  FL:         59  mm     G. Age:  30w 6d         16  %    FL/AC:      21.0   %    20 - 24  HUM:      53.9  mm     G. Age:  31w 2d         44  %  Est. FW:    1765  gm    3 lb 14 oz      51  % ---------------------------------------------------------------------- OB History  Gravidity:    5         Term:   1        Prem:   2        SAB:   1  TOP:          0       Ectopic:  0        Living: 3 ---------------------------------------------------------------------- Gestational Age  LMP:           32w 5d        Date:  04/24/18                 EDD:   01/29/19  U/S Today:     31w 0d                                        EDD:   02/10/19  Best:          31w 5d     Det. ByMarcella Dubs:  Early Ultrasound         EDD:   02/05/19                                      (06/20/18) ---------------------------------------------------------------------- Anatomy  Cranium:               Appears normal         Aortic Arch:            Previously  seen  Cavum:                 Previously seen        Ductal Arch:            Previously seen  Ventricles:            Appears normal         Diaphragm:              Previously seen   Choroid Plexus:        Previously seen        Stomach:                Appears normal, left                                                                        sided  Cerebellum:            Previously seen        Abdomen:                Previously seen  Posterior Fossa:       Previously seen        Abdominal Wall:         Previously seen  Nuchal Fold:           Previously seen        Cord Vessels:           Previously seen  Face:                  Orbits and profile     Kidneys:                Appear normal                         previously seen  Lips:                  Previously seen        Bladder:                Appears normal  Thoracic:              Appears normal         Spine:                  Previously seen  Heart:                 Previously seen        Upper Extremities:      Previously seen  RVOT:                  Appears normal         Lower Extremities:      Previously seen  LVOT:                  Appears normal  Other:  Heels and 5th digit visualized previously. Nasal bone visualized          previously. ---------------------------------------------------------------------- Cervix Uterus Adnexa  Cervix  Not visualized (advanced GA >24wks)  Uterus  No abnormality visualized.  Left Ovary  No  adnexal mass visualized.  Right Ovary  No adnexal mass visualized.  Cul De Sac  No free fluid seen.  Adnexa  No abnormality visualized. ---------------------------------------------------------------------- Impression  Normal interval growth.  Low risk NIPS ---------------------------------------------------------------------- Recommendations  Follow up as clinically indicated. ----------------------------------------------------------------------               Sander Nephew, MD Electronically Signed Final Report   12/09/2018 05:15 pm ----------------------------------------------------------------------   Assessment and Plan:  Pregnancy: [id number] at [redacted]w[redacted]d 1. GERD without esophagitis Rx: - omeprazole  (PRILOSEC) 20 MG capsule; Take 1 capsule (20 mg total) by mouth 2 (two) times daily before a meal.  Dispense: 60 capsule; Refill: 5  Preterm labor symptoms and general obstetric precautions including but not limited to vaginal bleeding, contractions, leaking of fluid and fetal movement were reviewed in detail with the patient. I discussed the assessment and treatment plan with the patient. The patient was provided an opportunity to ask questions and all were answered. The patient agreed with the plan and demonstrated an understanding of the instructions. The patient was advised to call back or seek an in-person office evaluation/go to [facility] at [facility] for any urgent or concerning symptoms. Please refer to After Visit Summary for other counseling recommendations.   I provided 20 minutes of face-to-face time during this encounter.  Return in about 1 week (around 01/11/2019) for [place] with Midwife.  Future Appointments  Date Time Provider [place]  01/05/2019 10:15 AM Reola Calkins [place]  01/11/2019 11:15 AM Gavin Pound, [place] None    Baltazar Najjar, [place] [facility] [place] [facility] 01-04-2019

## 2019-01-04 NOTE — Progress Notes (Signed)
I connected with Judy [name] on 01/04/19 at 10:15 AM EDT by telephone and verified that I am speaking with the correct person using two identifiers.   Pt c/o intermittent low back pain 10/10 "when the pain comes" no relief with Flexeril nor maternity belt.

## 2019-01-05 ENCOUNTER — Inpatient Hospital Stay (HOSPITAL_COMMUNITY)
Admission: AD | Admit: 2019-01-05 | Discharge: 2019-01-06 | DRG: 807 | Disposition: A | Discharge status: Home / Self Care | Payer: Medicaid Other | Attending: Obstetrics and Gynecology | Admitting: Obstetrics and Gynecology

## 2019-01-05 ENCOUNTER — Other Ambulatory Visit: Payer: Self-pay

## 2019-01-05 ENCOUNTER — Other Ambulatory Visit: Payer: Medicaid Other

## 2019-01-05 ENCOUNTER — Encounter (HOSPITAL_COMMUNITY): Payer: Self-pay

## 2019-01-05 DIAGNOSIS — O42013 Preterm premature rupture of membranes, onset of labor within 24 hours of rupture, third trimester: Secondary | ICD-10-CM

## 2019-01-05 DIAGNOSIS — Z880 Allergy status to penicillin: Secondary | ICD-10-CM | POA: Diagnosis not present

## 2019-01-05 DIAGNOSIS — Z3A35 35 weeks gestation of pregnancy: Secondary | ICD-10-CM

## 2019-01-05 DIAGNOSIS — O479 False labor, unspecified: Secondary | ICD-10-CM | POA: Diagnosis present

## 2019-01-05 DIAGNOSIS — O42913 Preterm premature rupture of membranes, unspecified as to length of time between rupture and onset of labor, third trimester: Secondary | ICD-10-CM | POA: Diagnosis present

## 2019-01-05 LAB — RPR: RPR Ser Ql: NONREACTIVE

## 2019-01-05 LAB — CBC
HCT: 36.1 % (ref 36.0–46.0)
HCT: 32.5 % — ABNORMAL LOW (ref 36.0–46.0)
Hemoglobin: 11.6 g/dL — ABNORMAL LOW (ref 12.0–15.0)
Hemoglobin: 10.6 g/dL — ABNORMAL LOW (ref 12.0–15.0)
MCH: 24.1 pg — ABNORMAL LOW (ref 26.0–34.0)
MCH: 24.3 pg — ABNORMAL LOW (ref 26.0–34.0)
MCHC: 32.1 g/dL (ref 30.0–36.0)
MCHC: 32.6 g/dL (ref 30.0–36.0)
MCV: 75.1 fL — ABNORMAL LOW (ref 80.0–100.0)
MCV: 74.4 fL — ABNORMAL LOW (ref 80.0–100.0)
Platelets: 118 10*3/uL — ABNORMAL LOW (ref 150–400)
Platelets: 121 10*3/uL — ABNORMAL LOW (ref 150–400)
RBC: 4.81 MIL/uL (ref 3.87–5.11)
RBC: 4.37 MIL/uL (ref 3.87–5.11)
RDW: 13.2 % (ref 11.5–15.5)
RDW: 13.2 % (ref 11.5–15.5)
WBC: 11.1 10*3/uL — ABNORMAL HIGH (ref 4.0–10.5)
WBC: 14.1 10*3/uL — ABNORMAL HIGH (ref 4.0–10.5)
nRBC: 0.2 % (ref 0.0–0.2)
nRBC: 0 % (ref 0.0–0.2)

## 2019-01-05 MED ORDER — ONDANSETRON HCL 4 MG/2ML IJ SOLN: 4.0000 mg | Freq: Four times a day (QID) | INTRAMUSCULAR | Status: DC | PRN

## 2019-01-05 MED ORDER — SENNOSIDES-DOCUSATE SODIUM 8.6-50 MG PO TABS
2.0000 | ORAL_TABLET | ORAL | Status: DC
  Filled 2019-01-05: qty 2

## 2019-01-05 MED ORDER — LACTATED RINGERS IV SOLN: INTRAVENOUS | Status: DC

## 2019-01-05 MED ORDER — PRENATAL MULTIVITAMIN CH
1.0000 | ORAL_TABLET | Freq: Every day | ORAL | Status: DC
  Administered 2019-01-05 – 2019-01-06 (×2): 1 via ORAL
  Filled 2019-01-05 (×2): qty 1

## 2019-01-05 MED ORDER — ONDANSETRON HCL 4 MG/2ML IJ SOLN: 4.0000 mg | INTRAMUSCULAR | Status: DC | PRN

## 2019-01-05 MED ORDER — ACETAMINOPHEN 325 MG PO TABS
650.0000 mg | ORAL_TABLET | ORAL | Status: DC | PRN
  Administered 2019-01-05 (×3): 650 mg via ORAL
  Filled 2019-01-05 (×3): qty 2

## 2019-01-05 MED ORDER — WITCH HAZEL-GLYCERIN EX PADS: 1.0000 "application " | MEDICATED_PAD | CUTANEOUS | Status: DC | PRN

## 2019-01-05 MED ORDER — ZOLPIDEM TARTRATE 5 MG PO TABS: 5.0000 mg | ORAL_TABLET | Freq: Every evening | ORAL | Status: DC | PRN

## 2019-01-05 MED ORDER — DIPHENHYDRAMINE HCL 25 MG PO CAPS: 25.0000 mg | ORAL_CAPSULE | Freq: Four times a day (QID) | ORAL | Status: DC | PRN

## 2019-01-05 MED ORDER — ACETAMINOPHEN 325 MG PO TABS: 650.0000 mg | ORAL_TABLET | ORAL | Status: DC | PRN

## 2019-01-05 MED ORDER — OXYTOCIN 40 UNITS IN NORMAL SALINE INFUSION - SIMPLE MED: 2.5000 [IU]/h | INTRAVENOUS | Status: DC

## 2019-01-05 MED ORDER — COCONUT OIL OIL: 1.0000 "application " | TOPICAL_OIL | Status: DC | PRN

## 2019-01-05 MED ORDER — BENZOCAINE-MENTHOL 20-0.5 % EX AERO: 1.0000 "application " | INHALATION_SPRAY | CUTANEOUS | Status: DC | PRN

## 2019-01-05 MED ORDER — OXYTOCIN 10 UNIT/ML IJ SOLN
INTRAMUSCULAR | Status: AC
  Administered 2019-01-05: 10 [IU]
  Filled 2019-01-05: qty 1

## 2019-01-05 MED ORDER — LIDOCAINE HCL (PF) 1 % IJ SOLN: 30.0000 mL | INTRAMUSCULAR | Status: DC | PRN

## 2019-01-05 MED ORDER — LACTATED RINGERS IV SOLN: 500.0000 mL | INTRAVENOUS | Status: DC | PRN

## 2019-01-05 MED ORDER — OXYCODONE-ACETAMINOPHEN 5-325 MG PO TABS: 1.0000 | ORAL_TABLET | ORAL | Status: DC | PRN

## 2019-01-05 MED ORDER — SOD CITRATE-CITRIC ACID 500-334 MG/5ML PO SOLN: 30.0000 mL | ORAL | Status: DC | PRN

## 2019-01-05 MED ORDER — TETANUS-DIPHTH-ACELL PERTUSSIS 5-2.5-18.5 LF-MCG/0.5 IM SUSP: 0.5000 mL | Freq: Once | INTRAMUSCULAR | Status: DC

## 2019-01-05 MED ORDER — OXYTOCIN 40 UNITS IN NORMAL SALINE INFUSION - SIMPLE MED
INTRAVENOUS | Status: AC
  Filled 2019-01-05: qty 1000

## 2019-01-05 MED ORDER — OXYTOCIN BOLUS FROM INFUSION: 500.0000 mL | Freq: Once | INTRAVENOUS | Status: DC

## 2019-01-05 MED ORDER — SIMETHICONE 80 MG PO CHEW: 80.0000 mg | CHEWABLE_TABLET | ORAL | Status: DC | PRN

## 2019-01-05 MED ORDER — ONDANSETRON HCL 4 MG PO TABS: 4.0000 mg | ORAL_TABLET | ORAL | Status: DC | PRN

## 2019-01-05 MED ORDER — DIBUCAINE (PERIANAL) 1 % EX OINT: 1.0000 "application " | TOPICAL_OINTMENT | CUTANEOUS | Status: DC | PRN

## 2019-01-05 MED ORDER — IBUPROFEN 600 MG PO TABS
600.0000 mg | ORAL_TABLET | Freq: Four times a day (QID) | ORAL | Status: DC
  Administered 2019-01-05 – 2019-01-06 (×6): 600 mg via ORAL
  Filled 2019-01-05 (×6): qty 1

## 2019-01-05 MED ORDER — OXYCODONE-ACETAMINOPHEN 5-325 MG PO TABS: 2.0000 | ORAL_TABLET | ORAL | Status: DC | PRN

## 2019-01-05 NOTE — H&P (Addendum)
LABOR AND DELIVERY ADMISSION HISTORY AND PHYSICAL NOTE  Judy [name] is a 23 y.o. female [phone] with IUP at 71w4dby LMP presenting for PPROM and SOL. She reports positive fetal movement. She denies leakage of fluid or vaginal bleeding.  Prenatal History/Complications: [facility]:  - h/o preterm delivery - GBS unknown (PCN allergy)  Past Medical History: Past Medical History:  Diagnosis Date  . Asthma   . History of preterm delivery   . Post partum depression   . Yeast infection of the vagina     Past Surgical History: Past Surgical History:  Procedure Laterality Date  . NO PAST SURGERIES      Obstetrical History: OB History    Gravida  5   Para  3   Term  1   Preterm  2   AB  1   Living  3     SAB  1   TAB  0   Ectopic  0   Multiple  0   Live Births  3           Social History: Social History   Socioeconomic History  . Marital status: Single    Spouse name: Not on file  . Number of children: 3  . Years of education: 146 . Highest education level: Some college, no degree  Occupational History  . Occupation: Unemployed  Social Needs  . Financial resource strain: Not hard at all  . Food insecurity    Worry: Never true    Inability: Never true  . Transportation needs    Medical: No    Non-medical: No  Tobacco Use  . Smoking status: Never Smoker  . Smokeless tobacco: Never Used  Substance and Sexual Activity  . Alcohol use: No  . Drug use: Not Currently  . Sexual activity: Not Currently    Partners: Male    Birth control/protection: None  Lifestyle  . Physical activity    Days per week: 7 days    Minutes per session: 10 min  . Stress: Only a little  Relationships  . Social connections    Talks on phone: More than three times a week    Gets together: More than three times a week    Attends religious service: More than 4 times per year    Active member of club or organization: No    Attends  meetings of clubs or organizations: Never    Relationship status: Living with partner  Other Topics Concern  . Not on file  Social History Narrative  . Not on file    Family History: Family History  Problem Relation Age of Onset  . Hypertension Mother   . Diabetes Mother   . Asthma Father   . Hypertension Paternal Aunt   . Hypertension Maternal Grandfather   . Heart disease Paternal Grandmother   . Heart disease Paternal Grandfather     Allergies: Allergies  Allergen Reactions  . Latex Dermatitis  . Penicillins Hives  . Tape Rash    Medications Prior to Admission  Medication Sig Dispense Refill Last Dose  . albuterol (PROVENTIL HFA;VENTOLIN HFA) 108 (90 BASE) MCG/ACT inhaler Inhale 2 puffs into the lungs every 6 (six) hours as needed for wheezing or shortness of breath.     . Blood Pressure Monitor KIT 1 each by Does not apply route daily. (Patient not taking: Reported on 01/04/2019) 1 each 0   . cyclobenzaprine (FLEXERIL) 5 MG tablet Take 1 tablet (  5 mg total) by mouth 3 (three) times daily as needed for muscle spasms. 30 tablet 2   . Elastic Bandages & Supports (COMFORT FIT MATERNITY SUPP SM) MISC Wear as directed. (Patient not taking: Reported on 08/24/2018) 1 each 0   . hydrocortisone (ANUSOL-HC) 25 MG suppository Place 1 suppository (25 mg total) rectally 2 (two) times daily. (Patient not taking: Reported on 01/04/2019) 24 suppository 11   . NIFEdipine (PROCARDIA) 10 MG capsule Take 1 capsule (10 mg total) by mouth every 6 (six) hours as needed. As needed for contractions 20 capsule 0   . omeprazole (PRILOSEC) 20 MG capsule Take 1 capsule (20 mg total) by mouth 2 (two) times daily before a meal. 60 capsule 5      Review of Systems  All systems reviewed and negative except as stated in HPI  Physical Exam Blood pressure (!) 158/100, pulse 82, last menstrual period 04/24/2018, unknown if currently breastfeeding. General appearance: alert, oriented Lungs: normal respiratory  effort Abdomen: soft, non-tender; gravid, FH appropriate for GA Extremities: No calf swelling or tenderness Presentation: cephalic Fetal monitoring: 140 bpm moderate variability 15 x 15 acels present early decels present Uterine activity: 1-2 mins Dilation: 10 Effacement (%): 90 Station: 0 Exam by:: Hansel Feinstein, CNM  Prenatal labs: ABO, Rh: B/Positive/-- (01/16 1059) Antibody: Negative (01/16 1059) Rubella: 5.98 (01/16 1059) RPR: Non Reactive (01/16 1059)  HBsAg: Negative (01/16 1059)  HIV: Non Reactive (01/16 1059)  GC/Chlamydia: Negative (07/07 0000) GBS:  Unknown  2-hr GTT: WNL Genetic screening: WNL Anatomy US: Normal  Prenatal Transfer Tool  Maternal Diabetes: No Genetic Screening: Normal Maternal Ultrasounds/Referrals: Normal Fetal Ultrasounds or other Referrals:  None Maternal Substance Abuse:  No Significant Maternal Medications:  None Significant Maternal Lab Results: Other: GBS unknown  No results found for this or any previous visit (from the past 24 hour(s)).  Patient Active Problem List   Diagnosis Date Noted  . Encounter for supervision of other normal pregnancy, unspecified trimester 07/07/2018  . Abnormal Pap smear of cervix 05/30/2018  . Dysmenorrhea 11/01/2012  . Depressive disorder, not elsewhere classified 10/03/2012    Assessment: Judy [name] is a 23 y.o. [phone] at 92w4dhere for PPROM and SOL.  #Labor: Complete on exam. AROM forebag prior to delivery. #Pain: Maternally supported #FWB: Cat I #ID: GBS unknown. Consider prophylatic treatment in delivery. Patient does have a PCN allergy #MOF: Breast #MOC: Depo #Circ:  Yes  Judy Fee DO Family Medicine Resident, PGY-1 01/05/2019, 3:28 AM  I confirm that I have verified the information documented in the resident's note and that I have also personally reperformed the physical exam and all medical decision making activities.  The patient was seen and examined by me  also Agree with note FHR reassuring UCs as listed Cervical exams as listed in note  Refused Covid testing. STates "I don't have it, I don't go anywhere"  WSeabron Spates CNM

## 2019-01-05 NOTE — Progress Notes (Signed)
FOB still threatening to take baby and mother out of hospital AMA when baby reaches a stable temp. Mother appropriate and tries to calm FOB. This RN finishing her shift with report given to RN, Merlyn Lot and charge RN [employer].

## 2019-01-05 NOTE — MAU Note (Addendum)
Pt presented to MAU via EMS c/o SROM @0155 . EMS reports strong ctx every 40min lasting 45 seconds long. Pt came in screaming for pain medications. L&D called and Hansel Feinstein called at [phone]. Patient to 217 via stretcher. Pt denied COVID swab. L&D personnel notified. IV and labs done at 0240.

## 2019-01-05 NOTE — Progress Notes (Signed)
CSW received consult for hx of Postpartum Depression.  CSW spoke with [name] via phone due to precautions placed on [name] at this time.   CSW called into room 403 and advised [name] of the reason for the call. 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 [name] reports that she has sent given birth to two other children and had PPD with those children as well. [name] reports that PPD with the last two children has not been bad for her. CSW advised [name] that considering her history of PPD she is more likely to develop PPD with this infant. CSW revived with [name] the signs and symptoms that she should look for.  [name] reported that she has no other mental health diagnosis and denies feeling SI or HI at this time. [name] asked CSW for carseat and then later reported that her uncle would be getting a carseat for her. CSW advised [name] that if anything is to change and a carseat is needed then let CSW know .[name] expressed that she has all other need items to care for infant at this time. [name] was agreeable to other resources at this time. CSW will make [place] and [facility] referral for infant at this time.   CSW provided education regarding the baby blues period vs. perinatal mood disorders, discussed treatment and gave resources for mental health follow up if concerns arise.  CSW recommends self-evaluation during the postpartum time period using the New Mom Checklist from Postpartum Progress and encouraged [name] to contact a medical professional if symptoms are noted at any time.   CSW provided review of Sudden Infant Death Syndrome (SIDS) precautions.   CSW identifies no further need for intervention and no barriers to discharge at this time.       Judy [name], MSW, LCSW [facility] [place] [facility] [place] [phone]

## 2019-01-05 NOTE — Progress Notes (Signed)
FOB frustrated at [facility] regarding Covid testing/ Airborne Precautions ie,father not allowed to leave room or if he does,hes not allowed to reenter hospital. FOB threatening to leave with [name] and baby AMA. Pediatrician notified. Per Pediatrician note, family was aware of babys extended stay for preterm status and GBS positive and not treated.This RN spoke to [employer] about escalating situation and Oley Balm from [employer] offered to speak to family.According to security, Family will take Covid swab test if their caregiver does. They also wish for baby to be transferred to another hospital if baby needs an extended stay. The situation will continue to be monitored.

## 2019-01-05 NOTE — Lactation Note (Signed)
This note was copied from a baby's chart. Lactation Consultation Note Baby 2 hrs old. 35 wks wt. 5.1lbs Mom has LPI sheet. Reviewed supplementing amount for After BF. Explained to mom if the baby doesn't BF and is only formula feeding it will need more volume, that paper is for supplementing after BF. Similac 22 cal. Taken to rm. Mom understands the importance of giving formula after feeding from breast. Baby noted to have bubbles in mouth. LC used suction bulb to clear mouth. Suctioned clear mucous. Baby started grunting afterwards. LC noted before suctioned have slightly labored breathing. Head moving w/respirations. Notified RN of baby grunting. Placed baby STS on mom's chest and covered. Mom stated she BF her 2 yr old for 3-4 months.  Mom has large everted nipples. Hand expression taught. Mom stated breast tender. No colostrum noted at this time. Baby hasn't been to breast. At this time, no interest d/t respirations. LPI 35 weeks newborn behavior, STS, I&O, supply and demand discussed. RN set up DEBP. Encouraged mom to pump every 3 hrs. Explained normal not to obtain anything colostrum at this time. Important to pump for stimulation then supplementation when able. Mom became distracted w/baby and it's breathing. Encouraged mom to call for increased grunting or changes in baby.  RN coming into check baby. Mom has [place] with her other children. Lactation brochure given to mom.  Patient Name: Judy [name] [id number] Date: 01/05/2019 Reason for consult: Initial assessment;Infant < 6lbs;Late-preterm 34-36.6wks   Maternal Data Has patient been taught Hand Expression?: Yes Does the patient have breastfeeding experience prior to this delivery?: Yes  Feeding Feeding Type: Bottle Fed - Formula  LATCH Score Latch: Too sleepy or reluctant, no latch achieved, no sucking elicited.     Type of Nipple: Everted at rest and after stimulation  Comfort (Breast/Nipple): Soft / non-tender         Interventions Interventions: Breast feeding basics reviewed;DEBP;Skin to skin;Breast massage;Hand express;Breast compression  Lactation Tools Discussed/Used Tools: Pump Breast pump type: Double-Electric Breast Pump Pump Review: Setup, frequency, and cleaning;Milk Storage Initiated by:: RN Date initiated:: 01/05/19   Consult Status Consult Status: Follow-up Date: 01/06/19 Follow-up type: In-patient    Dianelys Scinto, [place] 01/05/2019, 6:03 AM

## 2019-01-06 MED ORDER — IBUPROFEN 600 MG PO TABS: 600.0000 mg | ORAL_TABLET | Freq: Four times a day (QID) | ORAL | 0 refills | Status: DC | PRN

## 2019-01-06 NOTE — Discharge Instructions (Signed)

## 2019-01-06 NOTE — Progress Notes (Signed)
Patient insists that she is leaving with baby ama. [place] notified. awaiting discharge order.

## 2019-01-06 NOTE — Plan of Care (Signed)
Education complete.

## 2019-01-06 NOTE — Discharge Summary (Signed)
Postpartum Discharge Summary     Patient Name: Judy [name] DOB: 10-27-1995 MRN: [id number]  Date of admission: 01/05/2019 Delivering Provider: Seabron Spates   Date of discharge: 01/06/2019  Admitting diagnosis: 38WKS CTX, WATER BROKE Intrauterine pregnancy: [redacted]w[redacted]d    Secondary diagnosis:  Active Problems:   Uterine contractions during pregnancy   Vaginal delivery  Additional problems: refusal of Covid testing while inpt     Discharge diagnosis: Preterm Pregnancy Delivered                                                                                                Post partum procedures:none  Augmentation: AROM  Complications: None  Hospital course:  Onset of Labor With Vaginal Delivery     23y.o. yo [id number] admitted in Active Labor on 01/05/2019. Patient had an uncomplicated labor course and progressed quickly to delivery after AROM as follows:  Membrane Rupture Time/Date: 2:52 AM ,01/05/2019   Intrapartum Procedures: Episiotomy: None [1]                                         Lacerations:  None [1]  Patient had a delivery of a Viable infant. 01/05/2019  Information for the patient's newborn:  MoLovetta, Condie0[[id number]]Delivery Method: Vag-Spont     Pateint had a postpartum course remarkable for continuing to refuse Covid testing. She is ambulating, tolerating a regular diet, passing flatus, and urinating well. Patient is discharged home in stable condition on 01/06/19. The plan was to keep the pt until tomorrow, but her RN reports and pt confirms that she is planning to leave with the baby AMA. Since pt is stable, [facility] although that would not be our first choice in the interest of the baby.   Magnesium Sulfate recieved: No BMZ received: No  Physical exam  Vitals:   01/05/19 1118 01/05/19 1545 01/05/19 2345 01/06/19 0617  BP: 137/71 134/78 125/66 127/75  Pulse: 76 75 97 79  Resp: _0 Temp: 98.2  F (36.8 C) 98.3 F (36.8 C) 98.3 F (36.8 C) 98.2 F (36.8 C)  TempSrc: Oral Oral Oral Oral  SpO2:  100% 100% 100%  Weight:      Height:       General: alert and no distress Lochia: appropriate Uterine Fundus: firm Incision: N/A DVT Evaluation: No evidence of DVT seen on physical exam. Labs: Lab Results  Component Value Date   WBC 14.1 (H) 01/05/2019   HGB 10.6 (L) 01/05/2019   HCT 32.5 (L) 01/05/2019   MCV 74.4 (L) 01/05/2019   PLT 121 (L) 01/05/2019   CMP Latest Ref Rng & Units 08/14/2018  Glucose 70 - 99 mg/dL 74  BUN 6 - 20 mg/dL <5(L)  Creatinine 0.44 - 1.00 mg/dL 0.58  Sodium 135 - 145 mmol/L 136  Potassium 3.5 - 5.1 mmol/L 3.9  Chloride 98 - 111 mmol/L 105  CO2 22 - 32 mmol/L  23  Calcium 8.9 - 10.3 mg/dL 9.4  Total Protein 6.5 - 8.1 g/dL 6.9  Total Bilirubin 0.3 - 1.2 mg/dL 0.4  Alkaline Phos 38 - 126 U/L 42  AST 15 - 41 U/L 16  ALT 0 - 44 U/L 9    Discharge instruction: per After Visit Summary and "Baby and Me Booklet".  After visit meds:  Allergies as of 01/06/2019      Reactions   Latex Dermatitis   Penicillins Hives   Tape Rash      Medication List    STOP taking these medications   Blood Pressure Monitor Kit   Comfort Fit Maternity Supp Sm Misc   cyclobenzaprine 5 MG tablet Commonly known as: FLEXERIL   hydrocortisone 25 MG suppository Commonly known as: ANUSOL-HC   NIFEdipine 10 MG capsule Commonly known as: Procardia   omeprazole 20 MG capsule Commonly known as: PriLOSEC   terconazole 0.4 % vaginal cream Commonly known as: TERAZOL 7     TAKE these medications   albuterol 108 (90 Base) MCG/ACT inhaler Commonly known as: VENTOLIN HFA Inhale 2 puffs into the lungs every 6 (six) hours as needed for wheezing or shortness of breath.   ibuprofen 600 MG tablet Commonly known as: ADVIL Take 1 tablet (600 mg total) by mouth every 6 (six) hours as needed.       Diet: routine diet  Activity: Advance as tolerated. Pelvic rest for  6 weeks.   Outpatient follow up:4 weeks Follow up Appt: Future Appointments  Date Time Provider [place]  02/02/2019  8:30 AM Woodroe Mode, MD [facility] None   Follow up Visit: Please schedule this patient for Postpartum visit in: 4 weeks with the following provider: Any provider For C/S patients schedule nurse incision check in weeks 2 weeks: no Low risk pregnancy complicated by: none Delivery mode:  SVD Anticipated Birth Control:  Depo- declines contraception at time of d/c PP Procedures needed: none  Schedule Integrated BH visit: no  Newborn Data: Live born female  Birth Weight: 5 lb 1 oz (2296 g) APGAR: 9, 9  Newborn Delivery   Birth date/time: 01/05/2019 03:06:00 Delivery type: Vaginal, Spontaneous      Baby Feeding: Bottle and Breast Disposition:pt is stating that she is taking baby home AMA   01/06/2019 Myrtis Ser, CNM  2:10 PM

## 2019-01-06 NOTE — Progress Notes (Unsigned)
CSW received call from Natalia Alexander with [place] CPS [phone]. CSW provided updates to Natalie along with Mother Baby Nursing Director on infants condition at this time. Natalia confirmed with CSW address given to CSW for [name] and FOB. CSW to give updates to Pediatrician once call back has been received.       Costa Jha S. Jalin Erpelding, MSW, LCSW [facility] [place] [phone]  

## 2019-01-06 NOTE — Progress Notes (Addendum)
1:42pm-CSW made CPS report to Pam Miller with [place] for [name]'s and FOB's  desire to leave AMA with infant. CSW informed that case would need to screen by supervisor for decision. CSW awaiting call back from [place] CPS on who case has been assigned to-if assigned today.   CSW informed that [name] is in need of carseat. CSW spoke with Vivian and was advised that carseat would be brought up to unit. CSW confirmed with [name] that she has no further needs. CSW made aware that [name] is leaving and also signing infant out AMA. CSW aware that Pediatrician to make CPS report for further concerns at this time.        Judy [name], MSW, LCSW [facility] [place] [phone]  

## 2019-01-17 ENCOUNTER — Telehealth: Payer: Self-pay

## 2019-01-17 NOTE — Telephone Encounter (Signed)
Pt requesting Rx for BV and yeast Pt is Breast feeding please advise.

## 2019-01-18 ENCOUNTER — Other Ambulatory Visit: Payer: Self-pay | Admitting: Obstetrics

## 2019-01-18 DIAGNOSIS — B373 Candidiasis of vulva and vagina: Secondary | ICD-10-CM

## 2019-01-18 DIAGNOSIS — B9689 Other specified bacterial agents as the cause of diseases classified elsewhere: Secondary | ICD-10-CM

## 2019-01-18 DIAGNOSIS — N760 Acute vaginitis: Secondary | ICD-10-CM

## 2019-01-18 DIAGNOSIS — B3731 Acute candidiasis of vulva and vagina: Secondary | ICD-10-CM

## 2019-01-18 MED ORDER — CLINDESSE 2 % VA CREA: TOPICAL_CREAM | VAGINAL | 1 refills | Status: DC

## 2019-01-18 MED ORDER — FLUCONAZOLE 150 MG PO TABS: 150.0000 mg | ORAL_TABLET | Freq: Once | ORAL | 1 refills | Status: AC

## 2019-02-02 ENCOUNTER — Ambulatory Visit: Payer: Medicaid Other | Admitting: Obstetrics & Gynecology

## 2019-02-14 ENCOUNTER — Telehealth: Payer: Self-pay | Admitting: Obstetrics & Gynecology

## 2019-05-13 ENCOUNTER — Other Ambulatory Visit: Payer: Self-pay

## 2019-05-13 ENCOUNTER — Encounter (HOSPITAL_COMMUNITY): Payer: Self-pay

## 2019-05-13 ENCOUNTER — Ambulatory Visit (HOSPITAL_COMMUNITY)
Admission: EM | Admit: 2019-05-13 | Discharge: 2019-05-13 | Disposition: A | Discharge status: Home / Self Care | Payer: Medicaid Other | Attending: Family Medicine | Admitting: Family Medicine

## 2019-05-13 DIAGNOSIS — Z3201 Encounter for pregnancy test, result positive: Secondary | ICD-10-CM

## 2019-05-13 DIAGNOSIS — R112 Nausea with vomiting, unspecified: Secondary | ICD-10-CM

## 2019-05-13 LAB — POC URINE PREG, ED: Preg Test, Ur: POSITIVE — AB

## 2019-05-13 LAB — POCT PREGNANCY, URINE: Preg Test, Ur: POSITIVE — AB

## 2019-05-13 MED ORDER — ONDANSETRON 4 MG PO TBDP: 4.0000 mg | ORAL_TABLET | Freq: Three times a day (TID) | ORAL | 0 refills | Status: DC | PRN

## 2019-05-13 NOTE — ED Triage Notes (Addendum)
Pt present possible pregnancy and nausea with vomiting. Symptoms started 3 days ago, pt is unable to keep any food down.

## 2019-05-13 NOTE — ED Provider Notes (Signed)
[facility]   [id number] 05/13/19 Arrival Time: 1002  ASSESSMENT & PLAN:  1. Intractable vomiting with nausea, unspecified vomiting type   2. Positive pregnancy test     No signs of dehydration requiring IVF at this time. Copy of pregnancy test given.  Meds ordered this encounter  Medications  . ondansetron (ZOFRAN-ODT) 4 MG disintegrating tablet    Sig: Take 1 tablet (4 mg total) by mouth every 8 (eight) hours as needed for nausea or vomiting.    Dispense:  15 tablet    Refill:  0    Recommend: Follow-up Information    Schedule an appointment as soon as possible for a visit  with [facility].   Specialty: Obstetrics and Gynecology Contact information: [address] [place] [address], [address] [id number] mc [place] [address] [phone]          Will do her best to ensure adequate fluid intake in order to avoid dehydration. Will proceed to the Emergency Department for evaluation if unable to tolerate PO fluids regularly.  Reviewed expectations re: course of current medical issues. Questions answered. Outlined signs and symptoms indicating need for more acute intervention. Patient verbalized understanding. After Visit Summary given.   SUBJECTIVE: History from: patient.  Judy [name] is a 23 y.o. female who presents with complaint of non-bilious, non-bloody intermittent n/v without non-bloody diarrhea. Onset 3-4 d ago. Abdominal discomfort: none. Symptoms are unchanged since beginning. Aggravating factors: eating. Alleviating factors: none. Associated symptoms: fatigue. She denies arthralgias, constipation, diarrhea, dysuria and fever. Appetite: decreased. PO intake: decreased. Ambulatory without assistance. Urinary symptoms: none. Sick contacts: none. Recent travel or camping: none. OTC treatment: none.  Patient's last menstrual period was 04/22/2019. She questions if she is pregnant.  Past  Surgical History:  Procedure Laterality Date  . NO PAST SURGERIES      ROS: As per HPI. All other systems negative.   OBJECTIVE:  Vitals:   05/13/19 1022  BP: 116/69  Pulse: 92  Resp: 16  Temp: 98.4 F (36.9 C)  TempSrc: Oral  SpO2: 97%    General appearance: alert; no distress Oropharynx: slightly dry Lungs: clear to auscultation bilaterally; unlabored Heart: regular rate and rhythm Abdomen: soft; non-distended; no significant abdominal tenderness; bowel sounds present; no masses or organomegaly; no guarding or rebound tenderness Back: no CVA tenderness Extremities: no edema; symmetrical with no gross deformities Skin: warm; dry Neurologic: normal gait Psychological: alert and cooperative; normal mood and affect  Labs: Results for orders placed or performed during the hospital encounter of 05/13/19  Pregnancy, urine POC  Result Value Ref Range   Preg Test, Ur POSITIVE (A) NEGATIVE  POC urine preg, ED (not at [facility])  Result Value Ref Range   Preg Test, Ur POSITIVE (A) NEGATIVE     Allergies  Allergen Reactions  . Latex Dermatitis  . Penicillins Hives  . Tape Rash                                               Past Medical History:  Diagnosis Date  . Asthma   . History of preterm delivery   . Post partum depression   . Yeast infection of the vagina    Social History   Socioeconomic History  . Marital status: Single    Spouse name: Not on file  . Number of  children: 3  . Years of education: 3  . Highest education level: Some college, no degree  Occupational History  . Occupation: Unemployed  Social Needs  . Financial resource strain: Not hard at all  . Food insecurity    Worry: Never true    Inability: Never true  . Transportation needs    Medical: No    Non-medical: No  Tobacco Use  . Smoking status: Never Smoker  . Smokeless tobacco: Never Used  Substance and Sexual Activity  . Alcohol use: No  . Drug use: Not Currently  . Sexual activity:  Not Currently    Partners: Male    Birth control/protection: None  Lifestyle  . Physical activity    Days per week: 7 days    Minutes per session: 10 min  . Stress: Only a little  Relationships  . Social connections    Talks on phone: More than three times a week    Gets together: More than three times a week    Attends religious service: More than 4 times per year    Active member of club or organization: No    Attends meetings of clubs or organizations: Never    Relationship status: Living with partner  . Intimate partner violence    Fear of current or ex partner: No    Emotionally abused: No    Physically abused: No    Forced sexual activity: No  Other Topics Concern  . Not on file  Social History Narrative  . Not on file   Family History  Problem Relation Age of Onset  . Hypertension Mother   . Diabetes Mother   . Asthma Father   . Hypertension Paternal Aunt   . Hypertension Maternal Grandfather   . Heart disease Paternal Grandmother   . Heart disease Paternal Reymundo Poll, MD 05/13/19 1049

## 2019-05-15 ENCOUNTER — Inpatient Hospital Stay (HOSPITAL_COMMUNITY)
Admission: AD | Admit: 2019-05-15 | Discharge: 2019-05-15 | Disposition: A | Discharge status: Home / Self Care | Payer: Medicaid Other | Attending: Obstetrics and Gynecology | Admitting: Obstetrics and Gynecology

## 2019-05-15 ENCOUNTER — Other Ambulatory Visit: Payer: Self-pay

## 2019-05-15 ENCOUNTER — Encounter (HOSPITAL_COMMUNITY): Payer: Self-pay

## 2019-05-15 DIAGNOSIS — O99511 Diseases of the respiratory system complicating pregnancy, first trimester: Secondary | ICD-10-CM | POA: Diagnosis not present

## 2019-05-15 DIAGNOSIS — O210 Mild hyperemesis gravidarum: Secondary | ICD-10-CM

## 2019-05-15 DIAGNOSIS — E876 Hypokalemia: Secondary | ICD-10-CM | POA: Insufficient documentation

## 2019-05-15 DIAGNOSIS — O26891 Other specified pregnancy related conditions, first trimester: Secondary | ICD-10-CM | POA: Diagnosis not present

## 2019-05-15 DIAGNOSIS — Z3A08 8 weeks gestation of pregnancy: Secondary | ICD-10-CM

## 2019-05-15 DIAGNOSIS — Z79899 Other long term (current) drug therapy: Secondary | ICD-10-CM | POA: Insufficient documentation

## 2019-05-15 DIAGNOSIS — J45909 Unspecified asthma, uncomplicated: Secondary | ICD-10-CM | POA: Insufficient documentation

## 2019-05-15 DIAGNOSIS — R112 Nausea with vomiting, unspecified: Secondary | ICD-10-CM | POA: Diagnosis present

## 2019-05-15 LAB — CBC
HCT: 38.8 % (ref 36.0–46.0)
Hemoglobin: 13.3 g/dL (ref 12.0–15.0)
MCH: 26.2 pg (ref 26.0–34.0)
MCHC: 34.3 g/dL (ref 30.0–36.0)
MCV: 76.4 fL — ABNORMAL LOW (ref 80.0–100.0)
Platelets: 409 10*3/uL — ABNORMAL HIGH (ref 150–400)
RBC: 5.08 MIL/uL (ref 3.87–5.11)
RDW: 13 % (ref 11.5–15.5)
WBC: 8.6 10*3/uL (ref 4.0–10.5)
nRBC: 0 % (ref 0.0–0.2)

## 2019-05-15 LAB — URINALYSIS, ROUTINE W REFLEX MICROSCOPIC
Bacteria, UA: NONE SEEN
Bilirubin Urine: NEGATIVE
Glucose, UA: NEGATIVE mg/dL
Hgb urine dipstick: NEGATIVE
Ketones, ur: 80 mg/dL — AB
Leukocytes,Ua: NEGATIVE
Nitrite: NEGATIVE
Protein, ur: 100 mg/dL — AB
Specific Gravity, Urine: 1.027 (ref 1.005–1.030)
pH: 5 (ref 5.0–8.0)

## 2019-05-15 LAB — BASIC METABOLIC PANEL
Anion gap: 19 — ABNORMAL HIGH (ref 5–15)
BUN: 7 mg/dL (ref 6–20)
CO2: 16 mmol/L — ABNORMAL LOW (ref 22–32)
Calcium: 9.6 mg/dL (ref 8.9–10.3)
Chloride: 99 mmol/L (ref 98–111)
Creatinine, Ser: 0.75 mg/dL (ref 0.44–1.00)
GFR calc Af Amer: >60 mL/min (ref >60)
GFR calc non Af Amer: >60 mL/min (ref >60)
Glucose, Bld: 88 mg/dL (ref 70–99)
Potassium: 2.8 mmol/L — ABNORMAL LOW (ref 3.5–5.1)
Sodium: 134 mmol/L — ABNORMAL LOW (ref 135–145)

## 2019-05-15 MED ORDER — PROMETHAZINE HCL 25 MG/ML IJ SOLN
12.5000 mg | Freq: Four times a day (QID) | INTRAMUSCULAR | Status: DC | PRN
  Administered 2019-05-15: 19:00:00 12.5 mg via INTRAVENOUS
  Filled 2019-05-15: qty 1

## 2019-05-15 MED ORDER — PROMETHAZINE HCL 25 MG PO TABS: 12.5000 mg | ORAL_TABLET | Freq: Four times a day (QID) | ORAL | 1 refills | Status: DC | PRN

## 2019-05-15 MED ORDER — POTASSIUM CHLORIDE ER 20 MEQ PO TBCR: 10.0000 meq | EXTENDED_RELEASE_TABLET | Freq: Two times a day (BID) | ORAL | 0 refills | Status: DC

## 2019-05-15 MED ORDER — LACTATED RINGERS IV BOLUS
1000.0000 mL | Freq: Once | INTRAVENOUS | Status: AC
  Administered 2019-05-15: 1000 mL via INTRAVENOUS

## 2019-05-15 MED ORDER — SCOPOLAMINE 1 MG/3DAYS TD PT72: 1.0000 | MEDICATED_PATCH | TRANSDERMAL | 1 refills | Status: DC

## 2019-05-15 MED ORDER — SCOPOLAMINE 1 MG/3DAYS TD PT72
1.0000 | MEDICATED_PATCH | TRANSDERMAL | Status: DC
  Administered 2019-05-15: 1.5 mg via TRANSDERMAL
  Filled 2019-05-15: qty 1

## 2019-05-15 NOTE — Discharge Instructions (Signed)
Morning Sickness  Morning sickness is when you feel sick to your stomach (nauseous) during pregnancy. You may feel sick to your stomach and throw up (vomit). You may feel sick in the morning, but you can feel this way at any time of day. Some women feel very sick to their stomach and cannot stop throwing up (hyperemesis gravidarum). Follow these instructions at home: Medicines  Take over-the-counter and prescription medicines only as told by your doctor. Do not take any medicines until you talk with your doctor about them first.  Taking multivitamins before getting pregnant can stop or lessen the harshness of morning sickness. Eating and drinking  Eat dry toast or crackers before getting out of bed.  Eat 5 or 6 small meals a day.  Eat dry and bland foods like rice and baked potatoes.  Do not eat greasy, fatty, or spicy foods.  Have someone cook for you if the smell of food causes you to feel sick or throw up.  If you feel sick to your stomach after taking prenatal vitamins, take them at night or with a snack.  Eat protein when you need a snack. Nuts, yogurt, and cheese are good choices.  Drink fluids throughout the day.  Try ginger ale made with real ginger, ginger tea made from fresh grated ginger, or ginger candies. General instructions  Do not use any products that have nicotine or tobacco in them, such as cigarettes and e-cigarettes. If you need help quitting, ask your doctor.  Use an air purifier to keep the air in your house free of smells.  Get lots of fresh air.  Try to avoid smells that make you feel sick.  Try: ? Wearing a bracelet that is used for seasickness (acupressure wristband). ? Going to a doctor who puts thin needles into certain body points (acupuncture) to improve how you feel. Contact a doctor if:  You need medicine to feel better.  You feel dizzy or light-headed.  You are losing weight. Get help right away if:  You feel very sick to your  stomach and cannot stop throwing up.  You pass out (faint).  You have very bad pain in your belly. Summary  Morning sickness is when you feel sick to your stomach (nauseous) during pregnancy.  You may feel sick in the morning, but you can feel this way at any time of day.  Making some changes to what you eat may help your symptoms go away. This information is not intended to replace advice given to you by your health care provider. Make sure you discuss any questions you have with your health care provider. Document Released: 07/16/2004 Document Revised: 05/21/2017 Document Reviewed: 07/09/2016 Elsevier Patient Education  2020 [employer].   Hypokalemia Hypokalemia means that the amount of potassium in the blood is lower than normal. Potassium is a chemical (electrolyte) that helps regulate the amount of fluid in the body. It also stimulates muscle tightening (contraction) and helps nerves work properly. Normally, most of the body's potassium is inside cells, and only a very small amount is in the blood. Because the amount in the blood is so small, minor changes to potassium levels in the blood can be life-threatening. What are the causes? This condition may be caused by:  Antibiotic medicine.  Diarrhea or vomiting. Taking too much of a medicine that helps you have a bowel movement (laxative) can cause diarrhea and lead to hypokalemia.  Chronic kidney disease (CKD).  Medicines that help the body get rid  of excess fluid (diuretics).  Eating disorders, such as bulimia.  Low magnesium levels in the body.  Sweating a lot. What are the signs or symptoms? Symptoms of this condition include:  Weakness.  Constipation.  Fatigue.  Muscle cramps.  Mental confusion.  Skipped heartbeats or irregular heartbeat (palpitations).  Tingling or numbness. How is this diagnosed? This condition is diagnosed with a blood test. How is this treated? This condition may be treated  by:  Taking potassium supplements by mouth.  Adjusting the medicines that you take.  Eating more foods that contain a lot of potassium. If your potassium level is very low, you may need to get potassium through an IV and be monitored in the hospital. Follow these instructions at home:   Take over-the-counter and prescription medicines only as told by your health care provider. This includes vitamins and supplements.  Eat a healthy diet. A healthy diet includes fresh fruits and vegetables, whole grains, healthy fats, and lean proteins.  If instructed, eat more foods that contain a lot of potassium. This includes: ? Nuts, such as peanuts and pistachios. ? Seeds, such as sunflower seeds and pumpkin seeds. ? Peas, lentils, and lima beans. ? Whole grain and bran cereals and breads. ? Fresh fruits and vegetables, such as apricots, avocado, bananas, cantaloupe, kiwi, oranges, tomatoes, asparagus, and potatoes. ? Orange juice. ? Tomato juice. ? Red meats. ? Yogurt.  Keep all follow-up visits as told by your health care provider. This is important. Contact a health care provider if you:  Have weakness that gets worse.  Feel your heart pounding or racing.  Vomit.  Have diarrhea.  Have diabetes (diabetes mellitus) and you have trouble keeping your blood sugar (glucose) in your target range. Get help right away if you:  Have chest pain.  Have shortness of breath.  Have vomiting or diarrhea that lasts for more than 2 days.  Faint. Summary  Hypokalemia means that the amount of potassium in the blood is lower than normal.  This condition is diagnosed with a blood test.  Hypokalemia may be treated by taking potassium supplements, adjusting the medicines that you take, or eating more foods that are high in potassium.  If your potassium level is very low, you may need to get potassium through an IV and be monitored in the hospital. This information is not intended to replace  advice given to you by your health care provider. Make sure you discuss any questions you have with your health care provider. Document Released: 06/08/2005 Document Revised: 01/19/2018 Document Reviewed: 01/19/2018 Elsevier Patient Education  2020 [employer].

## 2019-05-15 NOTE — MAU Note (Signed)
Pt states that she has not been able to keep anything down for 5 days. Pt reports going to urgent care two days ago and was prescribed some nausea medicine. Pt reports not picking up the prescription because she states the side effects of the medication could be worse. Pt reports that she just wanted to come in and try to get an IV for her dehydration.   Pt reports not knowing how far along she is. Pt reports she found out she was pregnant while at urgent care.   Denies vaginal bleeding.

## 2019-05-15 NOTE — MAU Provider Note (Signed)
History     CSN: [id number]  Arrival date and time: 05/15/19 3875   First Provider Initiated Contact with Patient 05/15/19 1834      Chief Complaint  Patient presents with  . Emesis  . Nausea   23 y.o. [id number] @[redacted]w[redacted]d  presenting for N/V. Sx started 5 days ago. Reports emesis 4 times a day. She has been tolerating water only until today. Denies diarrhea or fevers. Denies abd pain or VB. She was seen at UC 2 days ago and given Rx for Zofran but didn't pick it up d/t feeling like she needed something on her stomach to take it.   OB History    Gravida  6   Para  4   Term  1   Preterm  3   AB  1   Living  4     SAB  1   TAB  0   Ectopic  0   Multiple  0   Live Births  4           Past Medical History:  Diagnosis Date  . Asthma   . History of preterm delivery   . Post partum depression   . Yeast infection of the vagina     Past Surgical History:  Procedure Laterality Date  . NO PAST SURGERIES      Family History  Problem Relation Age of Onset  . Hypertension Mother   . Diabetes Mother   . Asthma Father   . Hypertension Paternal Aunt   . Hypertension Maternal Grandfather   . Heart disease Paternal Grandmother   . Heart disease Paternal Grandfather     Social History   Tobacco Use  . Smoking status: Never Smoker  . Smokeless tobacco: Never Used  Substance Use Topics  . Alcohol use: No  . Drug use: Not Currently    Allergies:  Allergies  Allergen Reactions  . Latex Dermatitis  . Penicillins Hives  . Tape Rash    Medications Prior to Admission  Medication Sig Dispense Refill Last Dose  . albuterol (PROVENTIL HFA;VENTOLIN HFA) 108 (90 BASE) MCG/ACT inhaler Inhale 2 puffs into the lungs every 6 (six) hours as needed for wheezing or shortness of breath.   Past Week at Unknown time  . Clindamycin Phosphate, 1 Dose, (CLINDESSE) vaginal cream One applicator vaginally at bedtime, once. 5 g 1   . ibuprofen (ADVIL) 600 MG tablet Take 1 tablet  (600 mg total) by mouth every 6 (six) hours as needed. 30 tablet 0   . ondansetron (ZOFRAN-ODT) 4 MG disintegrating tablet Take 1 tablet (4 mg total) by mouth every 8 (eight) hours as needed for nausea or vomiting. 15 tablet 0     Review of Systems  Constitutional: Negative for chills and fever.  Gastrointestinal: Positive for nausea and vomiting. Negative for abdominal pain and diarrhea.  Genitourinary: Negative for vaginal bleeding.   Physical Exam   Blood pressure (!) 123/54, pulse 73, temperature 98.1 F (36.7 C), temperature source Oral, resp. rate 16, last menstrual period 03/18/2019, SpO2 100 %, unknown if currently breastfeeding.  Physical Exam  Nursing note and vitals reviewed. Constitutional: She is oriented to person, place, and time. She appears well-developed and well-nourished.  HENT:  Head: Normocephalic and atraumatic.  Neck: Normal range of motion.  Cardiovascular: Normal rate.  Respiratory: Effort normal. No respiratory distress.  Musculoskeletal: Normal range of motion.  Neurological: She is alert and oriented to person, place, and time.  Psychiatric: She has a normal  mood and affect.   Results for orders placed or performed during the hospital encounter of 05/15/19 (from the past 24 hour(s))  CBC     Status: Abnormal   Collection Time: 05/15/19  6:59 PM  Result Value Ref Range   WBC 8.6 4.0 - 10.5 K/uL   RBC 5.08 3.87 - 5.11 MIL/uL   Hemoglobin 13.3 12.0 - 15.0 g/dL   HCT 60.438.8 54.036.0 - 98.146.0 %   MCV 76.4 (L) 80.0 - 100.0 fL   MCH 26.2 26.0 - 34.0 pg   MCHC 34.3 30.0 - 36.0 g/dL   RDW 19.113.0 47.811.5 - 29.515.5 %   Platelets 409 (H) 150 - 400 K/uL   nRBC 0.0 0.0 - 0.2 %  Basic metabolic panel     Status: Abnormal   Collection Time: 05/15/19  6:59 PM  Result Value Ref Range   Sodium 134 (L) 135 - 145 mmol/L   Potassium 2.8 (L) 3.5 - 5.1 mmol/L   Chloride 99 98 - 111 mmol/L   CO2 16 (L) 22 - 32 mmol/L   Glucose, Bld 88 70 - 99 mg/dL   BUN 7 6 - 20 mg/dL    Creatinine, Ser 6.210.75 0.44 - 1.00 mg/dL   Calcium 9.6 8.9 - 30.810.3 mg/dL   GFR calc non Af Amer >60 >60 mL/min   GFR calc Af Amer >60 >60 mL/min   Anion gap 19 (H) 5 - 15   MAU Course  Procedures Meds ordered this encounter  Medications  . lactated ringers bolus 1,000 mL  . scopolamine (TRANSDERM-SCOP) 1 MG/3DAYS 1.5 mg  . promethazine (PHENERGAN) injection 12.5 mg  . scopolamine (TRANSDERM-SCOP) 1 MG/3DAYS    Sig: Place 1 patch (1.5 mg total) onto the skin every 3 (three) days.    Dispense:  10 patch    Refill:  1    Order Specific Question:   Supervising Provider    Answer:   Conan BowensAVIS, KELLY M [[id number]]  . promethazine (PHENERGAN) 25 MG tablet    Sig: Take 0.5-1 tablets (12.5-25 mg total) by mouth every 6 (six) hours as needed for nausea or vomiting.    Dispense:  30 tablet    Refill:  1    Order Specific Question:   Supervising Provider    Answer:   Conan BowensAVIS, KELLY M [[id number]]  . potassium chloride 20 MEQ TBCR    Sig: Take 10 mEq by mouth 2 (two) times daily.    Dispense:  14 tablet    Refill:  0    Order Specific Question:   Supervising Provider    Answer:   Conan BowensDAVIS, KELLY M [[id number]]   MDM Labs ordered and reviewed. Feeling better after meds. Tolerating po liquids. Will replete K. Stable for discharge home.   Assessment and Plan   1. [redacted] weeks gestation of pregnancy   2. Morning sickness   3. Hypokalemia    [facility] Rx Scopolamine Rx Phenergan Rx KDur Return precautions  Allergies as of 05/15/2019      Reactions   Latex Dermatitis   Penicillins Hives   Tape Rash      Medication List    STOP taking these medications   Clindesse vaginal cream Generic drug: Clindamycin Phosphate (1 Dose)   ibuprofen 600 MG tablet Commonly known as: ADVIL   ondansetron 4 MG disintegrating tablet Commonly known as: ZOFRAN-ODT     TAKE these medications   albuterol 108 (90 Base) MCG/ACT inhaler Commonly known  as: VENTOLIN HFA Inhale 2  puffs into the lungs every 6 (six) hours as needed for wheezing or shortness of breath.   Potassium Chloride ER 20 MEQ Tbcr Take 10 mEq by mouth 2 (two) times daily.   promethazine 25 MG tablet Commonly known as: PHENERGAN Take 0.5-1 tablets (12.5-25 mg total) by mouth every 6 (six) hours as needed for nausea or vomiting.   scopolamine 1 MG/3DAYS Commonly known as: TRANSDERM-SCOP Place 1 patch (1.5 mg total) onto the skin every 3 (three) days. Start taking on: May 18, 2019       Donette Larry, [place] 05/15/2019, 8:31 PM

## 2019-06-27 ENCOUNTER — Other Ambulatory Visit (HOSPITAL_COMMUNITY)
Admission: RE | Admit: 2019-06-27 | Discharge: 2019-06-27 | Disposition: A | Discharge status: Home / Self Care | Payer: Medicaid Other | Source: Ambulatory Visit | Attending: Obstetrics & Gynecology | Admitting: Obstetrics & Gynecology

## 2019-06-27 ENCOUNTER — Encounter: Payer: Self-pay | Admitting: Obstetrics & Gynecology

## 2019-06-27 ENCOUNTER — Ambulatory Visit (INDEPENDENT_AMBULATORY_CARE_PROVIDER_SITE_OTHER): Payer: Medicaid Other | Admitting: Obstetrics & Gynecology

## 2019-06-27 ENCOUNTER — Other Ambulatory Visit: Payer: Self-pay

## 2019-06-27 DIAGNOSIS — Z3480 Encounter for supervision of other normal pregnancy, unspecified trimester: Secondary | ICD-10-CM | POA: Diagnosis present

## 2019-06-27 DIAGNOSIS — Z3481 Encounter for supervision of other normal pregnancy, first trimester: Secondary | ICD-10-CM | POA: Diagnosis not present

## 2019-06-27 DIAGNOSIS — O09899 Supervision of other high risk pregnancies, unspecified trimester: Secondary | ICD-10-CM | POA: Insufficient documentation

## 2019-06-27 DIAGNOSIS — Z3A14 14 weeks gestation of pregnancy: Secondary | ICD-10-CM

## 2019-06-27 DIAGNOSIS — O09892 Supervision of other high risk pregnancies, second trimester: Secondary | ICD-10-CM

## 2019-06-27 MED ORDER — BLOOD PRESSURE KIT DEVI: 1.0000 | 0 refills | Status: DC

## 2019-06-27 NOTE — Progress Notes (Signed)
Subjective:    Judy [name] is a [id number] [redacted]w[redacted]d being seen today for her first obstetrical visit.  Her obstetrical history is significant for previous preterm deliveries. Patient does intend to breast feed. Pregnancy history fully reviewed.  Patient reports no complaints.  Vitals:   06/27/19 1632  BP: (!) 100/58  Pulse: 92  Temp: (!) 97.1 F (36.2 C)  Weight: 116 lb (52.6 kg)    HISTORY: OB History  Gravida Para Term Preterm AB Living  6 4 1 3 1 4   SAB TAB Ectopic Multiple Live Births  1 0 0 0 4    # Outcome Date GA Lbr Len/2nd Weight Sex Delivery Anes PTL Lv  6 Current           5 Preterm 01/05/19 [redacted]w[redacted]d 04:52 / 00:14 5 lb 1 oz (2.296 kg) M Vag-Spont None  LIV  4 Preterm 04/02/16 [redacted]w[redacted]d 07:50 / 00:09 5 lb 13 oz (2.635 kg) F Vag-Spont EPI  LIV     Birth Comments: none  3 Preterm 08/10/12 [redacted]w[redacted]d 09:42 / 00:24 6 lb 4.7 oz (2.855 kg) M Vag-Spont EPI  LIV  2 Term 09/12/11 [redacted]w[redacted]d 08:02 / 00:30 6 lb 1.4 oz (2.761 kg) F Vag-Spont EPI  LIV     Birth Comments: WNL  1 SAB            Past Medical History:  Diagnosis Date  . Anxiety   . Asthma   . History of preterm delivery   . Hyperthyroidism   . Post partum depression   . Yeast infection of the vagina    Past Surgical History:  Procedure Laterality Date  . NO PAST SURGERIES     Family History  Problem Relation Age of Onset  . Hypertension Mother   . Diabetes Mother   . Anxiety disorder Mother   . Asthma Mother   . Cancer Mother   . Asthma Father   . Hypertension Paternal Aunt   . Hypertension Maternal Grandfather   . Heart disease Paternal Grandmother   . Heart disease Paternal Grandfather      Exam    Uterus:     Pelvic Exam:    Perineum: No Hemorrhoids   Vulva: normal   Vagina:  normal mucosa   pH:     Cervix: no lesions   Adnexa: normal adnexa   Bony Pelvis: average  System: Breast:  normal appearance, no masses or tenderness   Skin: normal coloration and turgor, no rashes    Neurologic:  oriented, normal mood   Extremities: normal strength, tone, and muscle mass   HEENT PERRLA, sclera clear, anicteric and neck supple with midline trachea   Mouth/Teeth mucous membranes moist, pharynx normal without lesions and dental hygiene good   Neck supple   Cardiovascular: regular rate and rhythm, no murmurs or gallops   Respiratory:  appears well, vitals normal, no respiratory distress, acyanotic, normal RR, neck free of mass or lymphadenopathy, chest clear, no wheezing, crepitations, rhonchi, normal symmetric air entry   Abdomen: soft, non-tender; bowel sounds normal; no masses,  no organomegaly   Urinary: urethral meatus normal      Assessment:    Pregnancy: [redacted]w[redacted]d Patient Active Problem List   Diagnosis Date Noted  . Supervision of other normal pregnancy, antepartum 06/27/2019  . History of preterm delivery, currently pregnant 06/27/2019  . Abnormal Pap smear of cervix 05/30/2018  . Dysmenorrhea 11/01/2012  . Depressive disorder, not elsewhere classified 10/03/2012        Plan:  Initial labs drawn. Prenatal vitamins. Problem list reviewed and updated. Genetic Screening discussed Panorama  Ultrasound discussed; fetal survey: ordered.  Follow up in 6 weeks. 50% of 30 min visit spent on counseling and coordination of care.  Babyscripts enrollment   Emeterio Reeve 06/27/2019

## 2019-06-27 NOTE — Patient Instructions (Signed)

## 2019-06-28 LAB — OBSTETRIC PANEL, INCLUDING HIV
Antibody Screen: NEGATIVE
Basophils Absolute: 0 10*3/uL (ref 0.0–0.2)
Basos: 0 %
EOS (ABSOLUTE): 0 10*3/uL (ref 0.0–0.4)
Eos: 0 %
HIV Screen 4th Generation wRfx: NONREACTIVE
Hematocrit: 35 % (ref 34.0–46.6)
Hemoglobin: 12.1 g/dL (ref 11.1–15.9)
Hepatitis B Surface Ag: NEGATIVE
Immature Grans (Abs): 0 10*3/uL (ref 0.0–0.1)
Immature Granulocytes: 0 %
Lymphocytes Absolute: 1.9 10*3/uL (ref 0.7–3.1)
Lymphs: 21 %
MCH: 27.2 pg (ref 26.6–33.0)
MCHC: 34.6 g/dL (ref 31.5–35.7)
MCV: 79 fL (ref 79–97)
Monocytes Absolute: 0.6 10*3/uL (ref 0.1–0.9)
Monocytes: 7 %
Neutrophils Absolute: 6.3 10*3/uL (ref 1.4–7.0)
Neutrophils: 72 %
Platelets: 346 10*3/uL (ref 150–450)
RBC: 4.45 x10E6/uL (ref 3.77–5.28)
RDW: 14.7 % (ref 11.7–15.4)
RPR Ser Ql: NONREACTIVE
Rh Factor: POSITIVE
Rubella Antibodies, IGG: 5.4 index (ref >0.99)
WBC: 8.8 10*3/uL (ref 3.4–10.8)

## 2019-06-29 LAB — CERVICOVAGINAL ANCILLARY ONLY
Bacterial Vaginitis (gardnerella): POSITIVE — AB
Candida Glabrata: NEGATIVE
Candida Vaginitis: NEGATIVE
Chlamydia: NEGATIVE
Comment: NEGATIVE
Comment: NEGATIVE
Comment: NEGATIVE
Comment: NEGATIVE
Comment: NEGATIVE
Comment: NORMAL
Neisseria Gonorrhea: NEGATIVE
Trichomonas: NEGATIVE

## 2019-07-02 LAB — URINE CULTURE, OB REFLEX

## 2019-07-02 LAB — CULTURE, OB URINE

## 2019-07-03 LAB — CYTOLOGY - PAP
Comment: NEGATIVE
Comment: NEGATIVE
Comment: NEGATIVE
Diagnosis: UNDETERMINED — AB
HPV 16: NEGATIVE
HPV 18 / 45: NEGATIVE
High risk HPV: POSITIVE — AB

## 2019-07-05 ENCOUNTER — Other Ambulatory Visit: Payer: Self-pay

## 2019-07-05 ENCOUNTER — Encounter: Payer: Self-pay | Admitting: Obstetrics & Gynecology

## 2019-07-05 MED ORDER — METRONIDAZOLE 500 MG PO TABS: 500.0000 mg | ORAL_TABLET | Freq: Two times a day (BID) | ORAL | 0 refills | Status: AC

## 2019-07-05 NOTE — Progress Notes (Signed)
Pt requests Rx for BV symptoms. Rx flagyl sent to pts pharmacy per protocol, pt made aware.

## 2019-07-06 ENCOUNTER — Encounter: Payer: Self-pay | Admitting: Obstetrics & Gynecology

## 2019-08-02 ENCOUNTER — Other Ambulatory Visit: Payer: Self-pay | Admitting: Obstetrics & Gynecology

## 2019-08-02 ENCOUNTER — Other Ambulatory Visit: Payer: Self-pay

## 2019-08-02 ENCOUNTER — Ambulatory Visit (HOSPITAL_COMMUNITY)
Admission: RE | Admit: 2019-08-02 | Discharge: 2019-08-02 | Disposition: A | Discharge status: Home / Self Care | Payer: Medicaid Other | Source: Ambulatory Visit | Attending: Obstetrics and Gynecology | Admitting: Obstetrics and Gynecology

## 2019-08-02 DIAGNOSIS — Z3480 Encounter for supervision of other normal pregnancy, unspecified trimester: Secondary | ICD-10-CM

## 2019-08-02 DIAGNOSIS — O09212 Supervision of pregnancy with history of pre-term labor, second trimester: Secondary | ICD-10-CM | POA: Diagnosis present

## 2019-08-02 DIAGNOSIS — O09219 Supervision of pregnancy with history of pre-term labor, unspecified trimester: Secondary | ICD-10-CM

## 2019-08-02 DIAGNOSIS — O43199 Other malformation of placenta, unspecified trimester: Secondary | ICD-10-CM

## 2019-08-02 DIAGNOSIS — Z3A18 18 weeks gestation of pregnancy: Secondary | ICD-10-CM | POA: Diagnosis not present

## 2019-08-03 DIAGNOSIS — O43199 Other malformation of placenta, unspecified trimester: Secondary | ICD-10-CM | POA: Insufficient documentation

## 2019-08-08 ENCOUNTER — Ambulatory Visit (INDEPENDENT_AMBULATORY_CARE_PROVIDER_SITE_OTHER): Payer: Medicaid Other | Admitting: Advanced Practice Midwife

## 2019-08-08 ENCOUNTER — Other Ambulatory Visit: Payer: Self-pay

## 2019-08-08 ENCOUNTER — Encounter: Payer: Self-pay | Admitting: Obstetrics

## 2019-08-08 ENCOUNTER — Encounter: Payer: Self-pay | Admitting: Advanced Practice Midwife

## 2019-08-08 VITALS — BP 114/66 | HR 94 | Temp 98.6°F | Wt 125.8 lb

## 2019-08-08 DIAGNOSIS — Z3480 Encounter for supervision of other normal pregnancy, unspecified trimester: Secondary | ICD-10-CM

## 2019-08-08 DIAGNOSIS — O09899 Supervision of other high risk pregnancies, unspecified trimester: Secondary | ICD-10-CM

## 2019-08-08 DIAGNOSIS — O43192 Other malformation of placenta, second trimester: Secondary | ICD-10-CM

## 2019-08-08 DIAGNOSIS — O0990 Supervision of high risk pregnancy, unspecified, unspecified trimester: Secondary | ICD-10-CM

## 2019-08-08 DIAGNOSIS — Z3A20 20 weeks gestation of pregnancy: Secondary | ICD-10-CM

## 2019-08-08 NOTE — Patient Instructions (Signed)

## 2019-08-08 NOTE — Progress Notes (Signed)
   PRENATAL VISIT NOTE  Subjective:  Judy [name] is a 24 y.o. [phone] at [redacted]w[redacted]d being seen today for ongoing prenatal care.  She is currently monitored for the following issues for this high-risk pregnancy and has Depressive disorder, not elsewhere classified; Dysmenorrhea; Abnormal Pap smear of cervix; Supervision of other normal pregnancy, antepartum; History of preterm delivery, currently pregnant; and Marginal insertion of umbilical cord affecting management of mother on their problem list.  Patient reports no complaints.  Contractions: Not present. Vag. Bleeding: None.   . Denies leaking of fluid.   The following portions of the patient's history were reviewed and updated as appropriate: allergies, current medications, past family history, past medical history, past social history, past surgical history and problem list.   Objective:   Vitals:   08/08/19 1436  BP: 114/66  Pulse: 94  Temp: 98.6 F (37 C)  Weight: 125 lb 12.8 oz (57.1 kg)    Fetal Status: Fetal Heart Rate (bpm): 142          General:  Alert, oriented and cooperative. Patient is in no acute distress.  Skin: Skin is warm and dry. No rash noted.   Cardiovascular: Normal heart rate noted  Respiratory: Normal respiratory effort, no problems with respiration noted  Abdomen: Soft, gravid, appropriate for gestational age.  Pain/Pressure: Present     Pelvic: Cervical exam deferred        Extremities: Normal range of motion.  Edema: None  Mental Status: Normal mood and affect. Normal behavior. Normal judgment and thought content.   Assessment and Plan:  Pregnancy: [id number] at [redacted]w[redacted]d 1. Supervision of high risk pregnancy, antepartum --Anticipatory guidance about next visits/weeks of pregnancy given. --Next visit in 4 weeks, virtual - AFP, Serum, Open Spina Bifida  2. Marginal insertion of umbilical cord affecting management of mother in second trimester --Reviewed anatomy US finding as variation of normal.   Sometimes can be associated with growth restriction so f/u ultrasound scheduled by MFM.  3.  Hx Preterm birth x 3 --G1, term delivery at 41 weeks, G2 at [redacted]w[redacted]d, G3 at [redacted]w[redacted]d, and G4 at [redacted]w[redacted]d.   --All babies did well and were discharged with mother. --Discussed 17-P option with recommendations, recent study findings, pt declines   Preterm labor symptoms and general obstetric precautions including but not limited to vaginal bleeding, contractions, leaking of fluid and fetal movement were reviewed in detail with the patient. Please refer to After Visit Summary for other counseling recommendations.   Return in about 4 weeks (around 09/05/2019).  No future appointments.  Sharen Counter, CNM

## 2019-08-10 LAB — AFP, SERUM, OPEN SPINA BIFIDA
AFP MoM: 0.83
AFP Value: 61.6 ng/mL
Gest. Age on Collection Date: 20.4 weeks
Maternal Age At EDD: 24.4 yr
OSBR Risk 1 IN: 10000
Test Results:: NEGATIVE
Weight: 125 [lb_av]

## 2019-08-18 ENCOUNTER — Inpatient Hospital Stay (HOSPITAL_COMMUNITY)
Admission: AD | Admit: 2019-08-18 | Discharge: 2019-08-18 | Disposition: A | Discharge status: Home / Self Care | Payer: Medicaid Other | Attending: Obstetrics and Gynecology | Admitting: Obstetrics and Gynecology

## 2019-08-18 ENCOUNTER — Ambulatory Visit: Payer: Medicaid Other

## 2019-08-18 ENCOUNTER — Other Ambulatory Visit: Payer: Self-pay

## 2019-08-18 ENCOUNTER — Other Ambulatory Visit (HOSPITAL_COMMUNITY)
Admission: RE | Admit: 2019-08-18 | Discharge: 2019-08-18 | Disposition: A | Discharge status: Home / Self Care | Payer: Medicaid Other | Source: Ambulatory Visit

## 2019-08-18 VITALS — BP 100/61 | HR 88 | Wt 123.0 lb

## 2019-08-18 DIAGNOSIS — Z3482 Encounter for supervision of other normal pregnancy, second trimester: Secondary | ICD-10-CM

## 2019-08-18 DIAGNOSIS — Z880 Allergy status to penicillin: Secondary | ICD-10-CM | POA: Insufficient documentation

## 2019-08-18 DIAGNOSIS — A549 Gonococcal infection, unspecified: Secondary | ICD-10-CM | POA: Diagnosis not present

## 2019-08-18 DIAGNOSIS — Z202 Contact with and (suspected) exposure to infections with a predominantly sexual mode of transmission: Secondary | ICD-10-CM

## 2019-08-18 DIAGNOSIS — Z3A21 21 weeks gestation of pregnancy: Secondary | ICD-10-CM | POA: Diagnosis not present

## 2019-08-18 DIAGNOSIS — Z113 Encounter for screening for infections with a predominantly sexual mode of transmission: Secondary | ICD-10-CM | POA: Diagnosis present

## 2019-08-18 DIAGNOSIS — Z3480 Encounter for supervision of other normal pregnancy, unspecified trimester: Secondary | ICD-10-CM

## 2019-08-18 DIAGNOSIS — O98212 Gonorrhea complicating pregnancy, second trimester: Secondary | ICD-10-CM | POA: Insufficient documentation

## 2019-08-18 MED ORDER — GENTAMICIN SULFATE 40 MG/ML IJ SOLN
240.0000 mg | Freq: Once | INTRAMUSCULAR | Status: AC
  Administered 2019-08-18: 240 mg via INTRAMUSCULAR
  Filled 2019-08-18: qty 6

## 2019-08-18 MED ORDER — AZITHROMYCIN 250 MG PO TABS
2000.0000 mg | ORAL_TABLET | Freq: Once | ORAL | Status: AC
  Administered 2019-08-18: 2000 mg via ORAL
  Filled 2019-08-18: qty 8

## 2019-08-18 NOTE — Progress Notes (Signed)
ROB  CC; Vaginal Odor recent STD Exposure to [facility].

## 2019-08-18 NOTE — MAU Note (Signed)
Pt sent from [facility] for treatment, partner +.

## 2019-08-18 NOTE — MAU Provider Note (Signed)
  S [name] is a 24 y.o. Judy [name] [redacted]w[redacted]d female who presents to MAU today from [facility] clinic for Gonorrhea treatment. Patient has a Penicillin allergy and was unable to be treated in clinic. She endorses vaginal itching and intermittent abnormal vaginal discharge. She denies all pregnancy-related complaints including vaginal bleeding, leaking of fluid, decreased fetal movement, fever, falls, or recent illness.   O LMP 03/18/2019    Physical Exam  Nursing note and vitals reviewed. Constitutional: She is oriented to person, place, and time. She appears well-developed and well-nourished.  Cardiovascular: Normal rate.  Respiratory: Effort normal. No respiratory distress.  GI: She exhibits no distension. There is no abdominal tenderness. There is no rebound and no guarding.  Gravid  Neurological: She is alert and oriented to person, place, and time.  Skin: Skin is dry.  Psychiatric: She has a normal mood and affect. Her behavior is normal. Judgment and thought content normal.   Meds ordered this encounter  Medications  . gentamicin (GARAMYCIN) injection 240 mg  . azithromycin (ZITHROMAX) tablet 2,000 mg    Confirmed Gonorrhea exposure, PCN allergy, per new STI treatment CDC   A Medical screening exam complete  P  Discharge from MAU in stable condition Patient may return to MAU as needed for pregnancy related complaints  S. Reita Cliche, [place] 08/18/2019 12:03 PM

## 2019-08-18 NOTE — Progress Notes (Signed)
   PRENATAL VISIT NOTE  Subjective:  Judy [name] is a 24 y.o. [id number] at [redacted]w[redacted]d who presents today for routine prenatal care.  She is currently being monitored for supervision of a low-risk pregnancy with problems as listed below.   She states she has had recent exposure to [facility] as her FOB reports positive diagnosis on Wednesday.  She denies activity with this partner since finding out.  She reports discharge that this watery with a light yellow color.  She also endorses odor, but unable to describe it. Patient endorses fetal movement and denies abdominal cramping or pain, but reports some back pain that started Sunday.  Patient perceives the pain as being related to her GC exposure.  Patient Active Problem List   Diagnosis Date Noted  . Marginal insertion of umbilical cord affecting management of mother 08/03/2019  . Supervision of other normal pregnancy, antepartum 06/27/2019  . History of preterm delivery, currently pregnant 06/27/2019  . Abnormal Pap smear of cervix 05/30/2018  . Dysmenorrhea 11/01/2012  . Depressive disorder, not elsewhere classified 10/03/2012    The following portions of the patient's history were reviewed and updated as appropriate: allergies, current medications, past family history, past medical history, past social history, past surgical history and problem list. Problem list updated.  Objective:   Vitals:   08/18/19 0840  BP: 100/61  Pulse: 88  Weight: 123 lb (55.8 kg)    Fetal Status: Fetal Heart Rate (bpm): 155 Fundal Height: 21 cm Movement: Present     General:  Alert, oriented and cooperative. Patient is in no acute distress.  Skin: Skin is warm and dry.   Cardiovascular: Regular rate and rhythm.  Respiratory: Normal respiratory effort. CTA-Bilaterally  Abdomen: Soft, gravid, appropriate for gestational age.  Pelvic: Cervical exam deferred        Speculum Exam:  White think discharge at introitus.  No apparent lesions. Vaginal vault with pink  mucosa and copious amt frothy yellow discharge with malodor. Cervix appears closed with frothy light yellowish green discharge from os.  CV collected BME deferred  Extremities: Normal range of motion.  Edema: None  Mental Status: Normal mood and affect. Normal behavior. Normal judgment and thought content.   Assessment and Plan:  Pregnancy: [id number] at [redacted]w[redacted]d  1. Supervision of other normal pregnancy, antepartum -Anticipatory guidance for upcoming visits. -Plan for VV in 3 weeks, then in office visit for GTT in 5-6 weeks with TOC if positive results.   2.  Exposure to sexually transmitted disease (STD) -Informed that exam is suspicious for GC. -Patient with PCN allergy that was diagnosed in 2020. [facility] [place] pharmacy contacted and Toniann Fail confirms that Gentamicin is on formulary. -Patient informed of need to go to MAU for treatment of Gentamicin 240mg  IM and Azithromycin 2gm PO -Patient reassured that if tests returns negative treatment would have no adverse effects on baby.  - Cervicovaginal ancillary only( [place])   Preterm labor symptoms and general obstetric precautions including but not limited to vaginal bleeding, contractions, leaking of fluid and fetal movement were reviewed with the patient.  Please refer to After Visit Summary for other counseling recommendations.  Return for LR-ROB.  Future Appointments  Date Time Provider Department Center  09/05/2019  4:00 PM 09/07/2019, MD [facility] None    Brock Bad, CNM 08/18/2019, 8:58 AM

## 2019-08-18 NOTE — Patient Instructions (Signed)
Pregnancy and Sexually Transmitted Infections An STI (sexually transmitted infection) is a disease or infection that may be passed (transmitted) from person to person, usually during sexual activity. This may happen by way of saliva, semen, blood, vaginal mucus, or urine. An STI can be caused by bacteria, viruses, or parasites. During pregnancy, STIs can be dangerous for you and your unborn baby. It is important to take steps to reduce your chances of getting an STI. Also, you need to be seen by your health care provider right away if you think you may have an STI, or if you think you may have been exposed to an STI. Diagnosis and treatment will depend on the type of STI. If you are already pregnant, you will be screened for HIV (human immunodeficiency virus) early in your pregnancy. If you are at high risk for HIV, this test may be repeated during your third trimester of pregnancy. What are some common STIs? There are different types of STIs. Some STIs that cause problems in pregnancy include:  Gonorrhea.  Chlamydia.  Syphilis.  HIV and AIDS (acquired immunodeficiency syndrome).  Genital herpes.  Hepatitis.  Genital warts.  Human papillomavirus (HPV).  Trichomoniasis. STIs that do not affect the baby include:  Chancroid.  Pubic lice. What are the possible effects of STIs during pregnancy? STIs can cause:  Stillbirth.  Miscarriage.  Premature labor.  Premature rupture of the membranes.  Serious birth defects or deformities.  Infection of the amniotic sac.  Infections that occur after birth (postpartum) in you and the baby.  Slowed growth of the baby before birth.  Illnesses in newborns. What are common symptoms of STIs? Different STIs have different symptoms. Some women may not have any symptoms. If symptoms are present, they may include:  Painful or bloody urination.  Pain in the pelvis, abdomen, vagina, anus, throat, or eyes.  A skin rash, itching, or  irritation.  Growths, ulcerations, blisters, or sores in the genital and anal areas.  Fever.  Abnormal vaginal discharge, with or without bad odor.  Pain or bleeding during sexual intercourse.  Yellowing of the skin and the white parts of the eyes (jaundice).  Swollen glands in the groin area. Even if symptoms are not present, an STI can still be passed to another person during sexual contact. How are STIs diagnosed? Your health care provider can use tests to determine if you have an STI. These may include blood tests, urine tests, and tests performed during a pelvic exam. You should be screened for STIs, including gonorrhea and chlamydia, if:  You are sexually active and are younger than age 2.  You are age 53 or older and your health care provider tells you that you are at risk for these types of infections.  Your sexual activity has changed since the last time you were screened, and you are at an increased risk for chlamydia or gonorrhea. Ask your health care provider if you are at risk. How can I reduce my risk of getting an STI? Take these actions to reduce your risk of getting an STI:  Do not have any oral, vaginal, or anal sex. This is known as practicing abstinence.  If you have sex, use a latex condom or a female condom consistently and correctly during sexual intercourse.  Use dental dams and water-soluble lubricants during sexual activity. Do not use petroleum jelly or oils.  Avoid having multiple sexual partners.  Do not have sex with someone who has other sexual partners.  Do not  have sex with anyone you do not know or who is at high risk for an STI.  Avoid risky sex acts that can break the skin.  Do not have sex if you have open sores on your mouth or skin.  Avoid engaging in oral and anal sex acts.  Get the hepatitis vaccine. It is safe for pregnant women.  What should I do if I think I have an STI?  See your health care provider.  Tell your sexual  partner or partners. They should be tested and treated for any STIs.  Do not have sex until your health care provider says it is okay. Get help right away if: Contact your health care provider right away if:  You have any symptoms of an STI.  You think that you have, or your sexual partner has, an STI even if there are no symptoms.  You think that you may have been exposed to an STI. This information is not intended to replace advice given to you by your health care provider. Make sure you discuss any questions you have with your health care provider. Document Revised: 09/30/2018 Document Reviewed: 01/13/2016 Elsevier Patient Education  2020 [employer].

## 2019-08-21 DIAGNOSIS — N898 Other specified noninflammatory disorders of vagina: Secondary | ICD-10-CM

## 2019-08-21 LAB — CERVICOVAGINAL ANCILLARY ONLY
Bacterial Vaginitis (gardnerella): POSITIVE — AB
Candida Glabrata: NEGATIVE
Candida Vaginitis: NEGATIVE
Chlamydia: POSITIVE — AB
Comment: NEGATIVE
Comment: NEGATIVE
Comment: NEGATIVE
Comment: NEGATIVE
Comment: NEGATIVE
Comment: NORMAL
Neisseria Gonorrhea: NEGATIVE
Trichomonas: NEGATIVE

## 2019-08-21 MED ORDER — TERCONAZOLE 0.8 % VA CREA: 1.0000 | TOPICAL_CREAM | Freq: Every day | VAGINAL | 0 refills | Status: DC

## 2019-08-22 ENCOUNTER — Other Ambulatory Visit: Payer: Self-pay

## 2019-08-22 DIAGNOSIS — N760 Acute vaginitis: Secondary | ICD-10-CM

## 2019-08-22 DIAGNOSIS — B9689 Other specified bacterial agents as the cause of diseases classified elsewhere: Secondary | ICD-10-CM

## 2019-08-22 MED ORDER — METRONIDAZOLE 0.75 % VA GEL: 1.0000 | Freq: Every day | VAGINAL | 0 refills | Status: DC

## 2019-09-05 ENCOUNTER — Telehealth: Payer: Medicaid Other | Admitting: Obstetrics

## 2019-09-20 ENCOUNTER — Encounter: Payer: Medicaid Other | Admitting: Obstetrics

## 2019-09-20 ENCOUNTER — Other Ambulatory Visit: Payer: Medicaid Other

## 2019-10-02 ENCOUNTER — Telehealth: Payer: Self-pay

## 2019-10-02 NOTE — Telephone Encounter (Signed)
Returned call, no answer, left vm 

## 2019-10-10 ENCOUNTER — Other Ambulatory Visit (HOSPITAL_COMMUNITY)
Admission: RE | Admit: 2019-10-10 | Discharge: 2019-10-10 | Disposition: A | Discharge status: Home / Self Care | Payer: Medicaid Other | Source: Ambulatory Visit | Attending: Obstetrics | Admitting: Obstetrics

## 2019-10-10 ENCOUNTER — Encounter: Payer: Self-pay | Admitting: Obstetrics and Gynecology

## 2019-10-10 ENCOUNTER — Ambulatory Visit (INDEPENDENT_AMBULATORY_CARE_PROVIDER_SITE_OTHER): Payer: Medicaid Other | Admitting: Obstetrics and Gynecology

## 2019-10-10 ENCOUNTER — Other Ambulatory Visit: Payer: Self-pay

## 2019-10-10 VITALS — BP 105/64 | HR 94 | Wt 128.0 lb

## 2019-10-10 DIAGNOSIS — Z3480 Encounter for supervision of other normal pregnancy, unspecified trimester: Secondary | ICD-10-CM

## 2019-10-10 DIAGNOSIS — O43199 Other malformation of placenta, unspecified trimester: Secondary | ICD-10-CM

## 2019-10-10 DIAGNOSIS — O43193 Other malformation of placenta, third trimester: Secondary | ICD-10-CM

## 2019-10-10 DIAGNOSIS — O09899 Supervision of other high risk pregnancies, unspecified trimester: Secondary | ICD-10-CM

## 2019-10-10 DIAGNOSIS — O09893 Supervision of other high risk pregnancies, third trimester: Secondary | ICD-10-CM

## 2019-10-10 DIAGNOSIS — Z3A28 28 weeks gestation of pregnancy: Secondary | ICD-10-CM

## 2019-10-10 NOTE — Progress Notes (Signed)
   PRENATAL VISIT NOTE  Subjective:  Judy [name] is a 24 y.o. [phone] at [redacted]w[redacted]d being seen today for ongoing prenatal care.  She is currently monitored for the following issues for this high-risk pregnancy and has Depressive disorder, not elsewhere classified; Dysmenorrhea; Abnormal Pap smear of cervix; Supervision of other normal pregnancy, antepartum; History of preterm delivery, currently pregnant; and Marginal insertion of umbilical cord affecting management of mother on their problem list.  Patient reports no complaints.  Contractions: Irritability. Vag. Bleeding: None.  Movement: Present. Denies leaking of fluid.   The following portions of the patient's history were reviewed and updated as appropriate: allergies, current medications, past family history, past medical history, past social history, past surgical history and problem list.   Objective:   Vitals:   10/10/19 1445  BP: 105/64  Pulse: 94  Weight: 128 lb (58.1 kg)    Fetal Status: Fetal Heart Rate (bpm): 142 Fundal Height: 27 cm Movement: Present     General:  Alert, oriented and cooperative. Patient is in no acute distress.  Skin: Skin is warm and dry. No rash noted.   Cardiovascular: Normal heart rate noted  Respiratory: Normal respiratory effort, no problems with respiration noted  Abdomen: Soft, gravid, appropriate for gestational age.  Pain/Pressure: Present     Pelvic: Cervical exam deferred        Extremities: Normal range of motion.  Edema: None  Mental Status: Normal mood and affect. Normal behavior. Normal judgment and thought content.   Assessment and Plan:  Pregnancy: [id number] at [redacted]w[redacted]d 1. Supervision of other normal pregnancy, antepartum Patient is doing well without complaints Patient declined glucola test and plans to check CBG at home. Patient advised to bring readings at next visit - Hemoglobin A1c - Cervicovaginal ancillary only( [place]) - CBC - HIV Antibody (routine testing w rflx) -  RPR  2. History of preterm delivery, currently pregnant   3. Marginal insertion of umbilical cord affecting management of mother Follow up growth ultrasound scheduled  Preterm labor symptoms and general obstetric precautions including but not limited to vaginal bleeding, contractions, leaking of fluid and fetal movement were reviewed in detail with the patient. Please refer to After Visit Summary for other counseling recommendations.   Return in about 2 weeks (around 10/24/2019) for in person, ROB.  No future appointments.  Catalina Antigua, MD

## 2019-10-11 ENCOUNTER — Other Ambulatory Visit: Payer: Self-pay | Admitting: Obstetrics and Gynecology

## 2019-10-11 DIAGNOSIS — F32A Depression, unspecified: Secondary | ICD-10-CM

## 2019-10-11 DIAGNOSIS — O99340 Other mental disorders complicating pregnancy, unspecified trimester: Secondary | ICD-10-CM

## 2019-10-11 DIAGNOSIS — F329 Major depressive disorder, single episode, unspecified: Secondary | ICD-10-CM

## 2019-10-11 LAB — CERVICOVAGINAL ANCILLARY ONLY
Chlamydia: NEGATIVE
Comment: NEGATIVE
Comment: NEGATIVE
Comment: NORMAL
Neisseria Gonorrhea: NEGATIVE
Trichomonas: NEGATIVE

## 2019-10-11 LAB — CBC
Hematocrit: 31.2 % — ABNORMAL LOW (ref 34.0–46.6)
Hemoglobin: 10.2 g/dL — ABNORMAL LOW (ref 11.1–15.9)
MCH: 23.3 pg — ABNORMAL LOW (ref 26.6–33.0)
MCHC: 32.7 g/dL (ref 31.5–35.7)
MCV: 71 fL — ABNORMAL LOW (ref 79–97)
Platelets: 245 10*3/uL (ref 150–450)
RBC: 4.38 x10E6/uL (ref 3.77–5.28)
RDW: 13.1 % (ref 11.7–15.4)
WBC: 8.5 10*3/uL (ref 3.4–10.8)

## 2019-10-11 LAB — HIV ANTIBODY (ROUTINE TESTING W REFLEX): HIV Screen 4th Generation wRfx: NONREACTIVE

## 2019-10-11 LAB — HEMOGLOBIN A1C
Est. average glucose Bld gHb Est-mCnc: 100 mg/dL
Hgb A1c MFr Bld: 5.1 % (ref 4.8–5.6)

## 2019-10-11 LAB — RPR: RPR Ser Ql: NONREACTIVE

## 2019-10-11 MED ORDER — SERTRALINE HCL 50 MG PO TABS: 50.0000 mg | ORAL_TABLET | Freq: Every day | ORAL | 5 refills | Status: DC

## 2019-10-24 ENCOUNTER — Encounter: Payer: Medicaid Other | Admitting: Obstetrics

## 2019-10-25 ENCOUNTER — Telehealth: Payer: Self-pay | Admitting: Obstetrics

## 2019-10-26 ENCOUNTER — Other Ambulatory Visit: Payer: Self-pay

## 2019-10-26 ENCOUNTER — Ambulatory Visit: Payer: Medicaid Other | Attending: Obstetrics and Gynecology

## 2019-10-26 DIAGNOSIS — Z363 Encounter for antenatal screening for malformations: Secondary | ICD-10-CM | POA: Diagnosis not present

## 2019-10-26 DIAGNOSIS — O43199 Other malformation of placenta, unspecified trimester: Secondary | ICD-10-CM | POA: Diagnosis present

## 2019-10-26 DIAGNOSIS — Z3A30 30 weeks gestation of pregnancy: Secondary | ICD-10-CM

## 2019-10-26 DIAGNOSIS — O43193 Other malformation of placenta, third trimester: Secondary | ICD-10-CM | POA: Diagnosis not present

## 2019-10-27 ENCOUNTER — Other Ambulatory Visit: Payer: Self-pay | Admitting: *Deleted

## 2019-10-27 DIAGNOSIS — O43199 Other malformation of placenta, unspecified trimester: Secondary | ICD-10-CM

## 2019-11-11 ENCOUNTER — Other Ambulatory Visit: Payer: Self-pay

## 2019-11-11 ENCOUNTER — Encounter (HOSPITAL_COMMUNITY): Payer: Self-pay | Admitting: Obstetrics and Gynecology

## 2019-11-11 ENCOUNTER — Inpatient Hospital Stay (HOSPITAL_COMMUNITY)
Admission: AD | Admit: 2019-11-11 | Discharge: 2019-11-11 | Disposition: A | Discharge status: Home / Self Care | Payer: Medicaid Other | Attending: Obstetrics and Gynecology | Admitting: Obstetrics and Gynecology

## 2019-11-11 DIAGNOSIS — Z91048 Other nonmedicinal substance allergy status: Secondary | ICD-10-CM | POA: Diagnosis not present

## 2019-11-11 DIAGNOSIS — Z880 Allergy status to penicillin: Secondary | ICD-10-CM | POA: Diagnosis not present

## 2019-11-11 DIAGNOSIS — F419 Anxiety disorder, unspecified: Secondary | ICD-10-CM | POA: Diagnosis not present

## 2019-11-11 DIAGNOSIS — R600 Localized edema: Secondary | ICD-10-CM

## 2019-11-11 DIAGNOSIS — O1203 Gestational edema, third trimester: Secondary | ICD-10-CM | POA: Insufficient documentation

## 2019-11-11 DIAGNOSIS — O99891 Other specified diseases and conditions complicating pregnancy: Secondary | ICD-10-CM | POA: Diagnosis not present

## 2019-11-11 DIAGNOSIS — Z91040 Latex allergy status: Secondary | ICD-10-CM | POA: Insufficient documentation

## 2019-11-11 DIAGNOSIS — Z79899 Other long term (current) drug therapy: Secondary | ICD-10-CM | POA: Insufficient documentation

## 2019-11-11 DIAGNOSIS — Z3689 Encounter for other specified antenatal screening: Secondary | ICD-10-CM

## 2019-11-11 DIAGNOSIS — O99343 Other mental disorders complicating pregnancy, third trimester: Secondary | ICD-10-CM | POA: Diagnosis not present

## 2019-11-11 DIAGNOSIS — O99513 Diseases of the respiratory system complicating pregnancy, third trimester: Secondary | ICD-10-CM | POA: Diagnosis not present

## 2019-11-11 DIAGNOSIS — O23593 Infection of other part of genital tract in pregnancy, third trimester: Secondary | ICD-10-CM | POA: Insufficient documentation

## 2019-11-11 DIAGNOSIS — M549 Dorsalgia, unspecified: Secondary | ICD-10-CM | POA: Diagnosis not present

## 2019-11-11 DIAGNOSIS — O26893 Other specified pregnancy related conditions, third trimester: Secondary | ICD-10-CM | POA: Insufficient documentation

## 2019-11-11 DIAGNOSIS — B9689 Other specified bacterial agents as the cause of diseases classified elsewhere: Secondary | ICD-10-CM

## 2019-11-11 DIAGNOSIS — O09213 Supervision of pregnancy with history of pre-term labor, third trimester: Secondary | ICD-10-CM | POA: Insufficient documentation

## 2019-11-11 DIAGNOSIS — J45909 Unspecified asthma, uncomplicated: Secondary | ICD-10-CM | POA: Insufficient documentation

## 2019-11-11 DIAGNOSIS — Z3A32 32 weeks gestation of pregnancy: Secondary | ICD-10-CM | POA: Insufficient documentation

## 2019-11-11 DIAGNOSIS — O09899 Supervision of other high risk pregnancies, unspecified trimester: Secondary | ICD-10-CM

## 2019-11-11 DIAGNOSIS — N760 Acute vaginitis: Secondary | ICD-10-CM

## 2019-11-11 LAB — URINALYSIS, ROUTINE W REFLEX MICROSCOPIC
Bilirubin Urine: NEGATIVE
Glucose, UA: 50 mg/dL — AB
Hgb urine dipstick: NEGATIVE
Ketones, ur: NEGATIVE mg/dL
Nitrite: NEGATIVE
Protein, ur: NEGATIVE mg/dL
Specific Gravity, Urine: 1.008 (ref 1.005–1.030)
pH: 7 (ref 5.0–8.0)

## 2019-11-11 LAB — WET PREP, GENITAL
Sperm: NONE SEEN
Trich, Wet Prep: NONE SEEN

## 2019-11-11 MED ORDER — METRONIDAZOLE 0.75 % VA GEL: 1.0000 | Freq: Every day | VAGINAL | 0 refills | Status: DC

## 2019-11-11 MED ORDER — CYCLOBENZAPRINE HCL 5 MG PO TABS
10.0000 mg | ORAL_TABLET | Freq: Once | ORAL | Status: AC
  Administered 2019-11-11: 10 mg via ORAL
  Filled 2019-11-11: qty 2

## 2019-11-11 MED ORDER — BETAMETHASONE SOD PHOS & ACET 6 (3-3) MG/ML IJ SUSP
12.0000 mg | Freq: Once | INTRAMUSCULAR | Status: AC
  Administered 2019-11-11: 12 mg via INTRAMUSCULAR
  Filled 2019-11-11: qty 5

## 2019-11-11 NOTE — Discharge Instructions (Signed)

## 2019-11-11 NOTE — MAU Provider Note (Signed)
History     CSN: [id number]  Arrival date and time: 11/11/19 2027   First Provider Initiated Contact with Patient 11/11/19 2102      Chief Complaint  Patient presents with  . Contractions  . Leg Swelling   Judy [name] is a 24 y.o. [id number] at 46w5dwho receives care at [facility].  She presents today for Contractions and Leg Swelling.  She states the contractions started yesterday and have been intermittent.  She reports that they improved with rest and hydration, but was initially 3-5 minutes apart. Patient also reports that she had some vaginal discharge last night that is clear and thick with mucous consistency.  Patient reports she noticed some swelling in her feet this morning.  She states she has been on her feet more recently because she has been caring for the children while her FOB has been working.  Patient also reports she has been having back pain for the past 2-3 days that has been constant and gradually worsening.  She describes the pain as "someone hitting me in my back."  She denies sexual activity in the past 3 days, constipation or diarrhea, and vomiting.  She endorses fetal movement and nausea. Patient endorses a history of CT in early pregnancy with negative TOC and partner treatment.  She expresses concern for PTD and states that is why she came today.       OB History    Gravida  6   Para  4   Term  1   Preterm  3   AB  1   Living  4     SAB  1   TAB  0   Ectopic  0   Multiple  0   Live Births  4           Past Medical History:  Diagnosis Date  . Anxiety   . Asthma   . History of preterm delivery   . Hyperthyroidism   . Post partum depression   . Vaginal Pap smear, abnormal   . Yeast infection of the vagina     Past Surgical History:  Procedure Laterality Date  . NO PAST SURGERIES      Family History  Problem Relation Age of Onset  . Hypertension Mother   . Diabetes Mother   . Anxiety disorder Mother   . Asthma Mother    . Cancer Mother   . Asthma Father   . Hypertension Paternal Aunt   . Hypertension Maternal Grandfather   . Heart disease Paternal Grandmother   . Heart disease Paternal Grandfather     Social History   Tobacco Use  . Smoking status: Never Smoker  . Smokeless tobacco: Never Used  Substance Use Topics  . Alcohol use: Not Currently  . Drug use: Not Currently    Allergies:  Allergies  Allergen Reactions  . Penicillins Hives  . Latex Dermatitis  . Tape Rash    Medications Prior to Admission  Medication Sig Dispense Refill Last Dose  . albuterol (PROVENTIL HFA;VENTOLIN HFA) 108 (90 BASE) MCG/ACT inhaler Inhale 2 puffs into the lungs every 6 (six) hours as needed for wheezing or shortness of breath.   Past Week at Unknown time  . metroNIDAZOLE (METROGEL VAGINAL) 0.75 % vaginal gel Place 1 Applicatorful vaginally at bedtime. Insert one applicator, at bedtime, for 5 nights. 70 g 0 Past Month at Unknown time  . Blood Pressure Monitoring (BLOOD PRESSURE KIT) DEVI 1 kit by Does not apply route  once a week. Check Blood Pressure regularly and record readings into the Babyscripts App.  Large Cuff.  DX O90.0 1 each 0   . potassium chloride 20 MEQ TBCR Take 10 mEq by mouth 2 (two) times daily. 14 tablet 0 Unknown at Unknown time  . promethazine (PHENERGAN) 25 MG tablet Take 0.5-1 tablets (12.5-25 mg total) by mouth every 6 (six) hours as needed for nausea or vomiting. (Patient not taking: Reported on 06/27/2019) 30 tablet 1   . scopolamine (TRANSDERM-SCOP) 1 MG/3DAYS Place 1 patch (1.5 mg total) onto the skin every 3 (three) days. (Patient not taking: Reported on 06/27/2019) 10 patch 1   . sertraline (ZOLOFT) 50 MG tablet Take 1 tablet (50 mg total) by mouth daily. 30 tablet 5   . terconazole (TERAZOL 3) 0.8 % vaginal cream Place 1 applicator vaginally at bedtime. Apply nightly for three nights. 20 g 0     Review of Systems  Constitutional: Negative for chills and fever.  Eyes: Negative for  visual disturbance.  Respiratory: Negative for cough and shortness of breath.   Gastrointestinal: Positive for abdominal pain and nausea. Negative for constipation, diarrhea and vomiting.  Genitourinary: Positive for vaginal discharge. Negative for difficulty urinating, dysuria, pelvic pain and vaginal bleeding.  Musculoskeletal: Positive for back pain.  Neurological: Negative for dizziness, light-headedness and headaches.   Physical Exam   Blood pressure 111/63, pulse 93, temperature 99 F (37.2 C), temperature source Oral, resp. rate 18, last menstrual period 03/18/2019, SpO2 100 %, unknown if currently breastfeeding.  Physical Exam  Constitutional: She is oriented to person, place, and time. She appears well-developed and well-nourished. No distress.  HENT:  Head: Normocephalic and atraumatic.  Eyes: Conjunctivae are normal.  Cardiovascular: Normal rate.  Respiratory: Effort normal.  GI: Soft. There is abdominal tenderness in the suprapubic area, left upper quadrant and left lower quadrant.  Gravid--fundal height appears SGA, Soft, NT   Genitourinary: Cervix exhibits no motion tenderness, no discharge and no friability.    Vaginal discharge present.     No vaginal bleeding.  No bleeding in the vagina.    Genitourinary Comments: Speculum Exam: -Normal External Genitalia: Non tender, Small amt thin gray discharge at introitus.  -Vaginal Vault: Pink mucosa with good rugae. Moderate amt thin gray discharge noted -wet prep collected -Cervix:Pink, no lesions, cysts, or polyps.  Appears closed. No active bleeding from os-GC/CT collected -Bimanual Exam:  Dilation: 1.5 Effacement (%): Thick Station: Ballotable Exam by:: Gavin Pound, CNM     Musculoskeletal:        General: Edema (Non pitting edema in BLE particularly the toes. ) present. Normal range of motion.     Cervical back: Normal range of motion.  Neurological: She is alert and oriented to person, place, and time.  Skin: Skin  is warm and dry.  Psychiatric: She has a normal mood and affect. Her behavior is normal.    Fetal Assessment 135 bpm, Mod Var, -Decels, +Accels Toco: No ctx graphed, irritability noted, One moderate contraction palpated  MAU Course   Results for orders placed or performed during the hospital encounter of 11/11/19 (from the past 24 hour(s))  Urinalysis, Routine w reflex microscopic     Status: Abnormal   Collection Time: 11/11/19  8:54 PM  Result Value Ref Range   Color, Urine YELLOW YELLOW   APPearance HAZY (A) CLEAR   Specific Gravity, Urine 1.008 1.005 - 1.030   pH 7.0 5.0 - 8.0   Glucose, UA 50 (A) NEGATIVE mg/dL  Hgb urine dipstick NEGATIVE NEGATIVE   Bilirubin Urine NEGATIVE NEGATIVE   Ketones, ur NEGATIVE NEGATIVE mg/dL   Protein, ur NEGATIVE NEGATIVE mg/dL   Nitrite NEGATIVE NEGATIVE   Leukocytes,Ua SMALL (A) NEGATIVE   RBC / HPF 0-5 0 - 5 RBC/hpf   WBC, UA 6-10 0 - 5 WBC/hpf   Bacteria, UA FEW (A) NONE SEEN   Squamous Epithelial / LPF 6-10 0 - 5   Mucus PRESENT   Wet prep, genital     Status: Abnormal   Collection Time: 11/11/19  9:24 PM   Specimen: PATH Cytology Cervicovaginal Ancillary Only  Result Value Ref Range   Yeast Wet Prep HPF POC PRESENT (A) NONE SEEN   Trich, Wet Prep NONE SEEN NONE SEEN   Clue Cells Wet Prep HPF POC PRESENT (A) NONE SEEN   WBC, Wet Prep HPF POC MANY (A) NONE SEEN   Sperm NONE SEEN    No results found.  MDM PE Labs:  Wet Prep, GC/CT, UA EFM Pain Medication Assessment and Plan  24 year old A1H1834  SIUP at 32.5weeks Cat I FT Back Pain Pedal Edema Uterine Irritability  -POC reviewed -Exam performed and findings discussed. -Cultures collected and pending.  -Will give flexeril for back pain. -Discussed giving BMZ today and repeat in 36 hours for history of PTL. Patient agreeable.  -Will await results and reassess.   Maryann Conners MSN, CNM 11/11/2019, 9:02 PM   Reassessment (10:16 PM) Yeast Infection BV  -Wet prep  returns significant for clue and yeast. -Results discussed with patient and informed would only treat for BV today since no complaints of vaginal itching, burning, or irritation.  -Rx for Metrogel to be sent to pharmacy on file. -Patient reports improvement in back pain with flexeril dosing. -Encouraged to go home and rest. -Reiterated need to return to MAU on Monday May 24th for repeat BMZ dosing. -Patient without further questions or concerns. -Instructed to keep next PNV as scheduled. -NST Reactive -Encouraged to call or return to MAU if symptoms worsen or with the onset of new symptoms. -Discharged to home in stable condition.  Maryann Conners MSN, CNM Advanced Practice Provider, [facility] for [facility]

## 2019-11-11 NOTE — MAU Note (Signed)
..  Judy [name] is a 24 y.o. at [redacted]w[redacted]d here in MAU reporting: CTX since last night. Pt states she "lost some mucous last night". Denies leaking of fluid. +FM. Pt states she drank some water and her contractions "slowed down." Pt also complains of feet swelling that began today and "came out of no where." Pt also reports lower extremity swelling that is tender to the touch. Pt is also reports constant back pain.   Pain score: CTX 6/10. Feet 5/10 Back pain 10/10

## 2019-11-13 ENCOUNTER — Other Ambulatory Visit: Payer: Self-pay

## 2019-11-13 ENCOUNTER — Inpatient Hospital Stay (HOSPITAL_COMMUNITY)
Admission: AD | Admit: 2019-11-13 | Discharge: 2019-11-13 | Disposition: A | Discharge status: Home / Self Care | Payer: Medicaid Other | Attending: Obstetrics and Gynecology | Admitting: Obstetrics and Gynecology

## 2019-11-13 DIAGNOSIS — Z3483 Encounter for supervision of other normal pregnancy, third trimester: Secondary | ICD-10-CM | POA: Diagnosis not present

## 2019-11-13 DIAGNOSIS — Z3A33 33 weeks gestation of pregnancy: Secondary | ICD-10-CM | POA: Insufficient documentation

## 2019-11-13 DIAGNOSIS — Z3480 Encounter for supervision of other normal pregnancy, unspecified trimester: Secondary | ICD-10-CM

## 2019-11-13 LAB — GC/CHLAMYDIA PROBE AMP (~~LOC~~) NOT AT ARMC
Chlamydia: NEGATIVE
Comment: NEGATIVE
Comment: NORMAL
Neisseria Gonorrhea: NEGATIVE

## 2019-11-13 LAB — CULTURE, OB URINE

## 2019-11-13 MED ORDER — BETAMETHASONE SOD PHOS & ACET 6 (3-3) MG/ML IJ SUSP
12.0000 mg | Freq: Once | INTRAMUSCULAR | Status: AC
  Administered 2019-11-13: 12 mg via INTRAMUSCULAR
  Filled 2019-11-13: qty 5

## 2019-11-13 NOTE — MAU Note (Signed)
Pt here for second BMZ injection. Denies LOF/VB/pain.

## 2019-11-15 ENCOUNTER — Encounter: Payer: Medicaid Other | Admitting: Obstetrics

## 2019-11-22 ENCOUNTER — Other Ambulatory Visit: Payer: Self-pay

## 2019-11-22 ENCOUNTER — Encounter: Payer: Self-pay | Admitting: Obstetrics

## 2019-11-22 ENCOUNTER — Ambulatory Visit (INDEPENDENT_AMBULATORY_CARE_PROVIDER_SITE_OTHER): Payer: Medicaid Other | Admitting: Obstetrics

## 2019-11-22 VITALS — BP 129/68 | HR 89 | Wt 137.0 lb

## 2019-11-22 DIAGNOSIS — O09893 Supervision of other high risk pregnancies, third trimester: Secondary | ICD-10-CM

## 2019-11-22 DIAGNOSIS — O43199 Other malformation of placenta, unspecified trimester: Secondary | ICD-10-CM

## 2019-11-22 DIAGNOSIS — Z3A34 34 weeks gestation of pregnancy: Secondary | ICD-10-CM

## 2019-11-22 DIAGNOSIS — O09899 Supervision of other high risk pregnancies, unspecified trimester: Secondary | ICD-10-CM

## 2019-11-22 DIAGNOSIS — O0993 Supervision of high risk pregnancy, unspecified, third trimester: Secondary | ICD-10-CM | POA: Diagnosis not present

## 2019-11-22 DIAGNOSIS — O0990 Supervision of high risk pregnancy, unspecified, unspecified trimester: Secondary | ICD-10-CM

## 2019-11-22 NOTE — Progress Notes (Signed)
Subjective:  Judy [name] is a 24 y.o. [phone] at [redacted]w[redacted]d being seen today for ongoing prenatal care.  She is currently monitored for the following issues for this high-risk pregnancy and has Depressive disorder, not elsewhere classified; Dysmenorrhea; Abnormal Pap smear of cervix; Supervision of other normal pregnancy, antepartum; History of preterm delivery, currently pregnant; and Marginal insertion of umbilical cord affecting management of mother on their problem list.  Patient reports occasional contractions.  Contractions: Irregular. Vag. Bleeding: None.  Movement: Present. Denies leaking of fluid.   The following portions of the patient's history were reviewed and updated as appropriate: allergies, current medications, past family history, past medical history, past social history, past surgical history and problem list. Problem list updated.  Objective:   Vitals:   11/22/19 1620  BP: 129/68  Pulse: 89  Weight: 137 lb (62.1 kg)    Fetal Status:     Movement: Present     General:  Alert, oriented and cooperative. Patient is in no acute distress.  Skin: Skin is warm and dry. No rash noted.   Cardiovascular: Normal heart rate noted  Respiratory: Normal respiratory effort, no problems with respiration noted  Abdomen: Soft, gravid, appropriate for gestational age. Pain/Pressure: Present     Pelvic:  Cervical exam performed        Extremities: Normal range of motion.     Mental Status: Normal mood and affect. Normal behavior. Normal judgment and thought content.   Urinalysis:      Assessment and Plan:  Pregnancy: [id number] at [redacted]w[redacted]d  1. Supervision of high risk pregnancy, antepartum  2. History of preterm delivery x 3, currently pregnant  3. Marginal insertion of umbilical cord affecting management of mother  4. SGA (small for gestational age), 1,250-1,499 grams       ( 23rd percentile with appropriate interval growth and AFI on last ultrasound ) - follow up ultrasound  scheduled by MFM for growth   Preterm labor symptoms and general obstetric precautions including but not limited to vaginal bleeding, contractions, leaking of fluid and fetal movement were reviewed in detail with the patient. Please refer to After Visit Summary for other counseling recommendations.   Return in about 2 weeks (around 12/06/2019) for ROB.   Brock Bad, MD  11/22/19

## 2019-11-22 NOTE — Progress Notes (Signed)
Pt would like cervix check today.  

## 2019-11-22 NOTE — Addendum Note (Signed)
Addended by: Marya Landry D on: 11/22/2019 05:01 PM   Modules accepted: Orders

## 2019-12-06 ENCOUNTER — Encounter: Payer: Medicaid Other | Admitting: Obstetrics

## 2019-12-07 ENCOUNTER — Ambulatory Visit: Payer: Medicaid Other

## 2019-12-13 ENCOUNTER — Encounter: Payer: Medicaid Other | Admitting: Obstetrics

## 2019-12-18 ENCOUNTER — Ambulatory Visit: Payer: Medicaid Other

## 2020-01-08 ENCOUNTER — Ambulatory Visit: Payer: Medicaid Other | Admitting: Obstetrics and Gynecology

## 2021-01-02 IMAGING — US US MFM OB DETAIL+14 WK
1 series · 13 of 28 positions shown · non-contrast
Comparison: none

[Series 1: us mfm ob detail+14 wk · 13 of 109 slices shown]
[im 5/109]
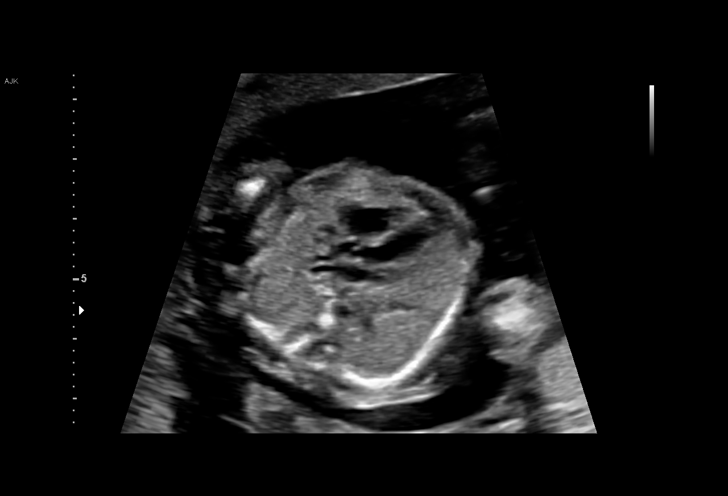
[im 13/109]
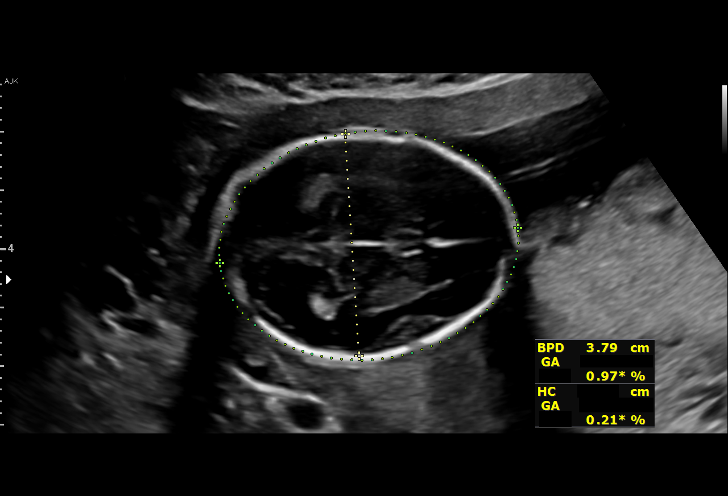
[im 21/109]
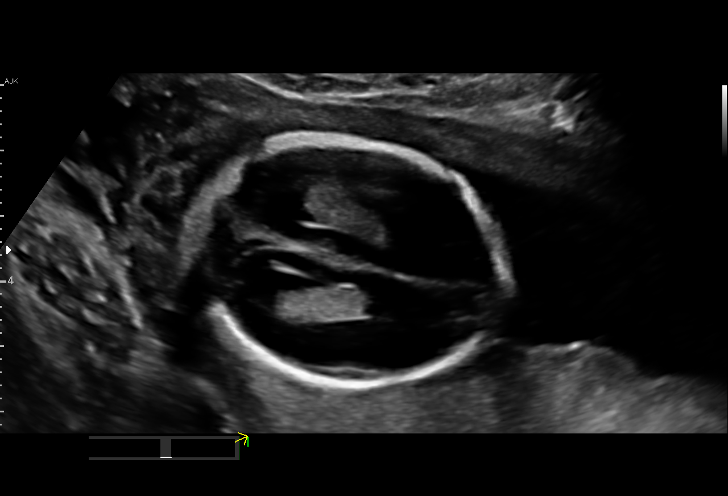
[im 29/109]
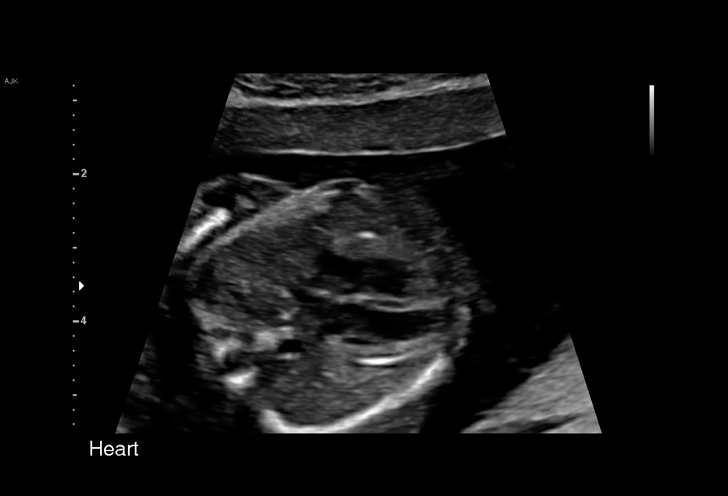
[im 37/109]
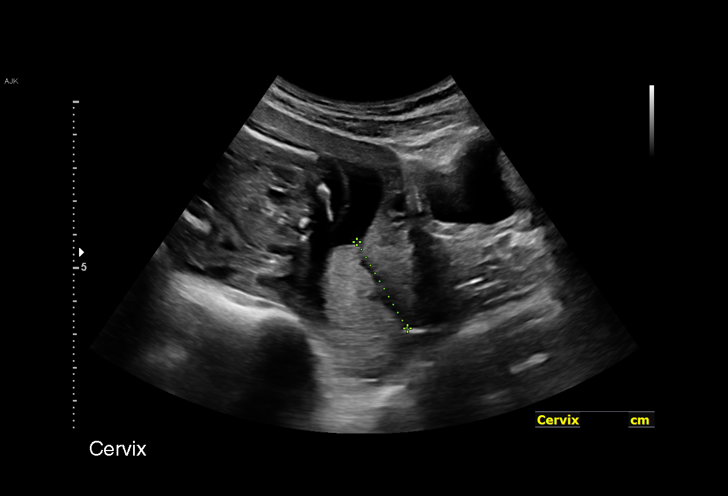
[im 45/109]
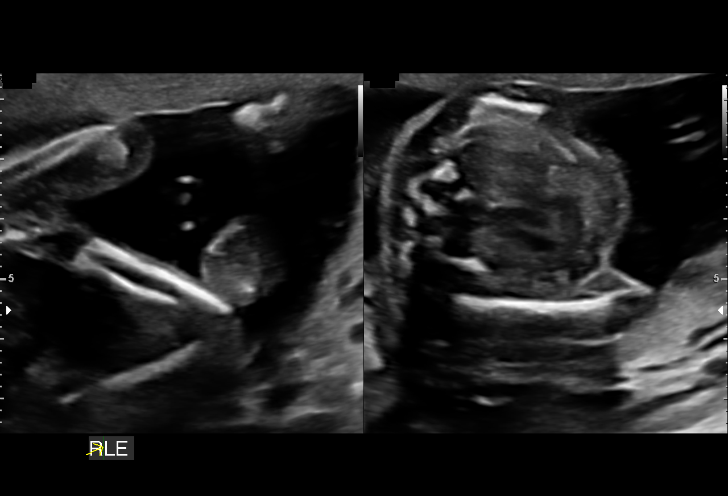
[im 57/109]
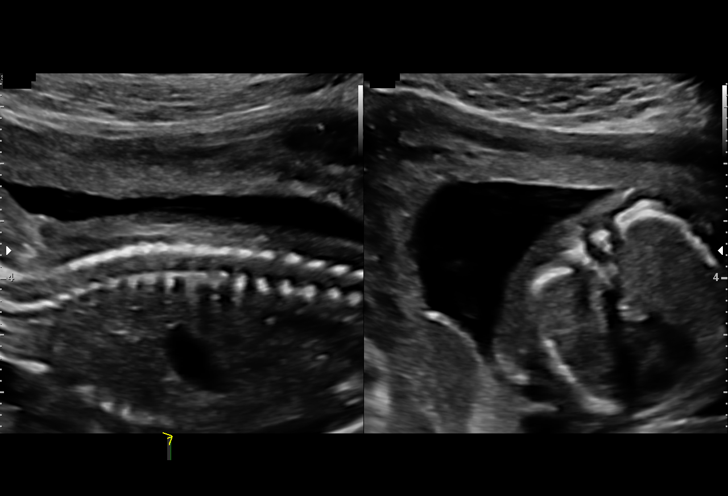
[im 65/109]
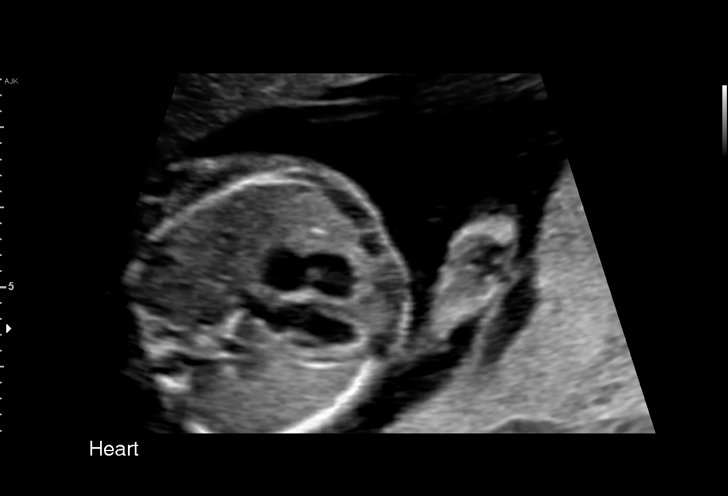
[im 73/109]
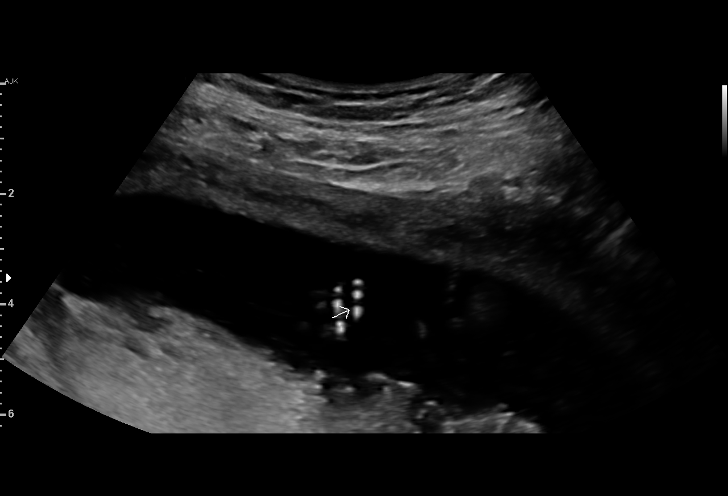
[im 81/109]
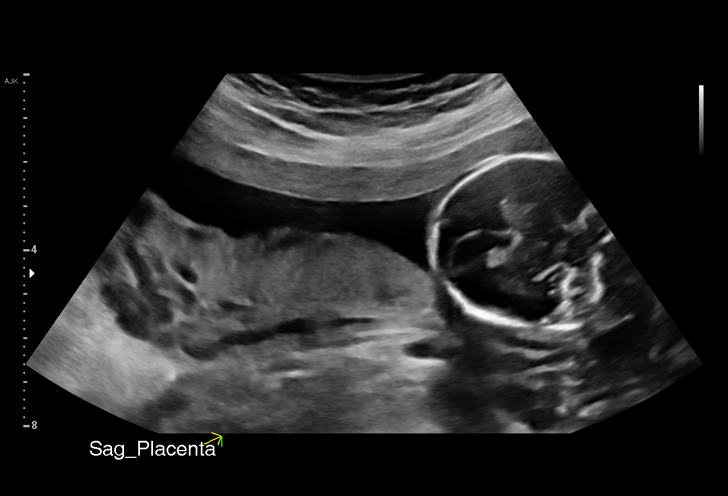
[im 89/109]
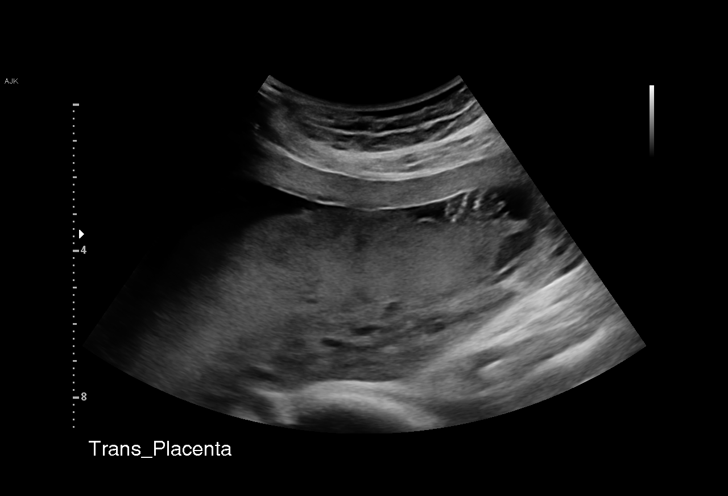
[im 97/109]
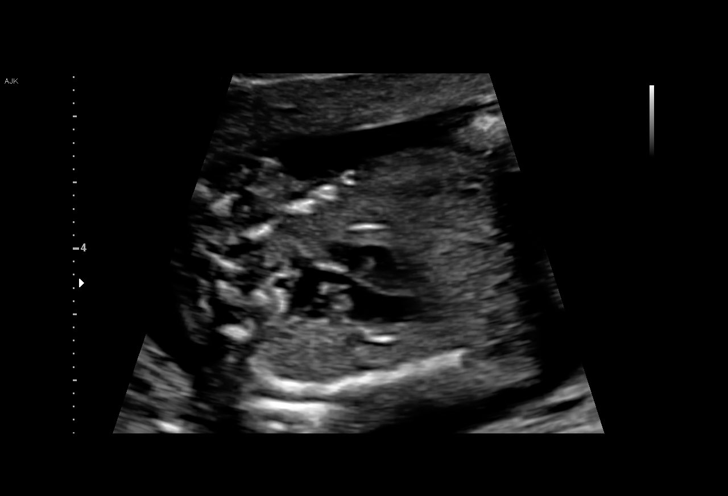
[im 105/109]
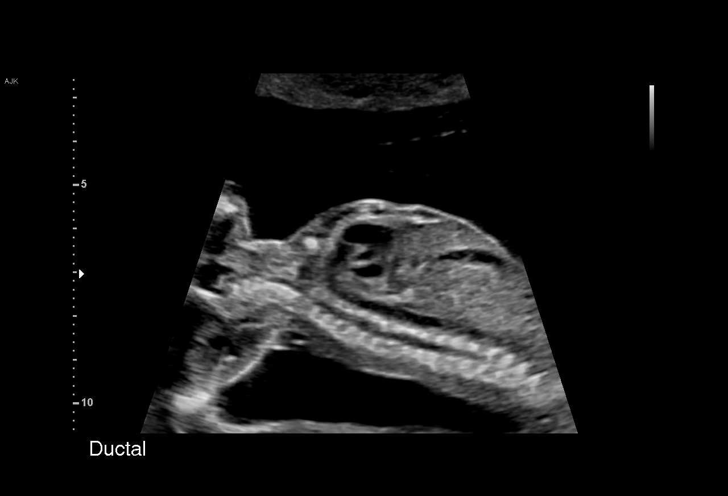

[13 of 28 positions shown; findings below may reference images not displayed]

----------------------------------------------------------------------

 ----------------------------------------------------------------------
Indications

  18 weeks gestation of pregnancy
  Encounter for antenatal screening for
  malformations (low risk nips, neg horizon)
  Poor obstetric history: Previous preterm
  delivery, antepartum (x3; 35w,35w,36)
 ----------------------------------------------------------------------
Vital Signs

 BMI:
Fetal Evaluation

 Num Of Fetuses:         1
 Fetal Heart Rate(bpm):  158
 Cardiac Activity:       Observed
 Presentation:           Breech
 Placenta:               Posterior
 P. Cord Insertion:      Marginal insertion

 Amniotic Fluid
 AFI FV:      Within normal limits

                             Largest Pocket(cm)

Biometry

 BPD:      37.6  mm     G. Age:  17w 3d         17  %    CI:        69.97   %    70 - 86
                                                         FL/HC:      19.1   %    15.8 - 18
 HC:      143.4  mm     G. Age:  17w 4d         13  %    HC/AC:      1.02        1.07 -
 AC:      140.8  mm     G. Age:  19w 3d         83  %    FL/BPD:     72.9   %
 FL:       27.4  mm     G. Age:  18w 3d         47  %    FL/AC:      19.5   %    20 - 24
 HUM:      27.3  mm     G. Age:  18w 5d         66  %
 CER:      18.1  mm     G. Age:  18w 0d         41  %
 NFT:       4.5  mm

 LV:        6.6  mm
 CM:        2.9  mm

 Est. FW:     256  gm      0 lb 9 oz     74  %
OB History

 Gravidity:    6         Term:   1        Prem:   3        SAB:   1
Gestational Age

 LMP:           19w 4d        Date:  03/18/19                 EDD:   12/23/19
 U/S Today:     18w 2d                                        EDD:   01/01/20
 Best:          18w 2d     Det. By:  U/S (08/02/19)           EDD:   01/01/20
Anatomy

 Cranium:               Appears normal         LVOT:                   Appears normal
 Cavum:                 Appears normal         Aortic Arch:            Appears normal
 Ventricles:            Appears normal         Ductal Arch:            Appears normal
 Choroid Plexus:        Appears normal         Diaphragm:              Appears normal
 Cerebellum:            Appears normal         Stomach:                Appears normal, left
                                                                       sided
 Posterior Fossa:       Appears normal         Abdomen:                Appears normal
 Nuchal Fold:           Appears normal         Abdominal Wall:         Appears nml (cord
                                                                       insert, abd wall)
 Face:                  Appears normal         Cord Vessels:           Appears normal (3
                        (orbits and profile)                           vessel cord)
 Lips:                  Appears normal         Kidneys:                Appear normal
 Palate:                Not well visualized    Bladder:                Appears normal
 Thoracic:              Appears normal         Spine:                  Appears normal
 Heart:                 Appears normal         Upper Extremities:      Appears normal
                        (4CH, axis, and
                        situs)
 RVOT:                  Appears normal         Lower Extremities:      Appears normal

 Other:  Fetus appears to be female. Nasal bone visualized. Heels
         visualized.RT 5th digit visualized.
Cervix Uterus Adnexa

 Cervix
 Length:            3.1  cm.
 Normal appearance by transabdominal scan.

 Uterus
 No abnormality visualized.

 Left Ovary
 Within normal limits.

 Right Ovary
 Within normal limits.

 Adnexa
 No abnormality visualized.
Impression

 G6 P4.  Patient has a history of 3 preterm deliveries at
 [DATE] 5 weeks.  In her first pregnancy she had a term
 vaginal delivery.
 On cell free fetal DNA screening, she had low risk for fetal
 aneuploidies.

 We performed a fetal anatomy scan.  Fetal biometry is
 consistent with 18 weeks 2 days gestation.  No markers of
 aneuploidies or fetal structural defects are seen.  Fetal
 anatomy appears normal.  Marginal cord insertion is seen.

 We have assigned her EDD at 01/01/2020.
 Marginal cord insertion is seen. I explained the finding that
 marginal cord insertion is not usually associated with fetal
 adverse outcomes. However, in some cases, it can be
 associated with fetal growth restriction. We recommend serial
 fetal growth assessments till delivery.
Recommendations

 -An appointment was made for her to return in 4 weeks for
 fetal growth assessment.
                 Christiansen, Jayshree

## 2021-06-22 NOTE — L&D Delivery Note (Cosign Needed Addendum)
Patient: Judy [name] MRN: [id number]  GBS status: unknown, IAP not given due to penicillin allergy and previous multiple pregnancies where she was GBS negative  Patient is a 26 y.o. now G8P7 s/p NSVD at [redacted]w[redacted]d, who was admitted for SOL. AROM 0h 39m prior to delivery with clear fluid.    Delivery Note At 2:51 AM a viable female was delivered via Vaginal, Spontaneous (Presentation: Middle Occiput Anterior).  APGAR: 8, 9; weight pending.   Placenta status: Spontaneous, Intact.  Cord: 3 vessels with the following complications: None.   Anesthesia: None Episiotomy:  None Lacerations:  None Suture Repair:  n/a Est. Blood Loss (mL):  36  Mom to postpartum.  Baby to Couplet care / Skin to Skin.  Madeleine Mendelow 04/24/2022, 3:17 AM     Head delivered Onward. No nuchal cord present. Shoulder and body delivered in usual fashion. Infant with spontaneous cry, placed on mother's abdomen, dried and bulb suctioned. Cord clamped x 2 after 5-minute delay per mother's request, and cut by mother. Cord blood drawn. Placenta delivered spontaneously with gentle cord traction. Fundus firm with massage and Pitocin. Perineum inspected and found to have no lacerations that required repair or were not hemostatic on their own.  Fellow ATTESTATION  I was present and gloved for this delivery and agree with the above documentation in the resident's note except as below.   Concepcion Living, MD [facility] (Faculty Practice) 04/25/2022, 9:03 AM

## 2021-09-24 ENCOUNTER — Ambulatory Visit: Payer: Medicaid Other

## 2021-10-08 ENCOUNTER — Ambulatory Visit: Payer: Medicaid Other

## 2021-10-24 ENCOUNTER — Ambulatory Visit (INDEPENDENT_AMBULATORY_CARE_PROVIDER_SITE_OTHER): Payer: Medicaid Other | Admitting: Licensed Clinical Social Worker

## 2021-10-24 ENCOUNTER — Ambulatory Visit (INDEPENDENT_AMBULATORY_CARE_PROVIDER_SITE_OTHER): Payer: Medicaid Other

## 2021-10-24 ENCOUNTER — Other Ambulatory Visit (HOSPITAL_COMMUNITY)
Admission: RE | Admit: 2021-10-24 | Discharge: 2021-10-24 | Disposition: A | Discharge status: Home / Self Care | Payer: Medicaid Other | Source: Ambulatory Visit | Attending: Obstetrics and Gynecology | Admitting: Obstetrics and Gynecology

## 2021-10-24 VITALS — BP 107/66 | HR 92 | Ht 62.5 in | Wt 117.6 lb

## 2021-10-24 DIAGNOSIS — O26891 Other specified pregnancy related conditions, first trimester: Secondary | ICD-10-CM | POA: Diagnosis present

## 2021-10-24 DIAGNOSIS — Z3A11 11 weeks gestation of pregnancy: Secondary | ICD-10-CM

## 2021-10-24 DIAGNOSIS — O99340 Other mental disorders complicating pregnancy, unspecified trimester: Secondary | ICD-10-CM | POA: Diagnosis not present

## 2021-10-24 DIAGNOSIS — Z3481 Encounter for supervision of other normal pregnancy, first trimester: Secondary | ICD-10-CM

## 2021-10-24 DIAGNOSIS — N898 Other specified noninflammatory disorders of vagina: Secondary | ICD-10-CM | POA: Diagnosis present

## 2021-10-24 DIAGNOSIS — O0990 Supervision of high risk pregnancy, unspecified, unspecified trimester: Secondary | ICD-10-CM | POA: Insufficient documentation

## 2021-10-24 DIAGNOSIS — F439 Reaction to severe stress, unspecified: Secondary | ICD-10-CM | POA: Diagnosis not present

## 2021-10-24 DIAGNOSIS — O0991 Supervision of high risk pregnancy, unspecified, first trimester: Secondary | ICD-10-CM

## 2021-10-24 DIAGNOSIS — E0590 Thyrotoxicosis, unspecified without thyrotoxic crisis or storm: Secondary | ICD-10-CM

## 2021-10-24 DIAGNOSIS — Z3480 Encounter for supervision of other normal pregnancy, unspecified trimester: Secondary | ICD-10-CM

## 2021-10-24 DIAGNOSIS — F32A Depression, unspecified: Secondary | ICD-10-CM | POA: Diagnosis not present

## 2021-10-24 DIAGNOSIS — O3680X0 Pregnancy with inconclusive fetal viability, not applicable or unspecified: Secondary | ICD-10-CM

## 2021-10-24 DIAGNOSIS — O219 Vomiting of pregnancy, unspecified: Secondary | ICD-10-CM

## 2021-10-24 MED ORDER — BLOOD PRESSURE KIT DEVI: 1.0000 | 0 refills | Status: DC

## 2021-10-24 MED ORDER — VITAFOL GUMMIES 3.33-0.333-34.8 MG PO CHEW: 3.0000 | CHEWABLE_TABLET | Freq: Every day | ORAL | 11 refills | Status: DC

## 2021-10-24 MED ORDER — DICLEGIS 10-10 MG PO TBEC: 2.0000 | DELAYED_RELEASE_TABLET | Freq: Every day | ORAL | 5 refills | Status: DC

## 2021-10-24 MED ORDER — SERTRALINE HCL 50 MG PO TABS: 50.0000 mg | ORAL_TABLET | Freq: Every day | ORAL | 1 refills | Status: DC

## 2021-10-24 NOTE — Progress Notes (Cosign Needed)
Patient presents for a New OB Intake. All New OB labs completed at this visit per Dr.Ervin due to history of hyperthyroidism.  ?

## 2021-10-24 NOTE — Progress Notes (Cosign Needed)
New OB Intake ? ?I connected with  Judy [name] on 10/24/21 at 10:15 AM EDT by in person and verified that I am speaking with the correct person using two identifiers. Nurse is located at [facility] and pt is located at [place]. ? ?I discussed the limitations, risks, security and privacy concerns of performing an evaluation and management service by telephone and the availability of in person appointments. I also discussed with the patient that there may be a patient responsible charge related to this service. The patient expressed understanding and agreed to proceed. ? ?I explained I am completing New OB Intake today. We discussed her EDD of 05/12/22 that is based on early u/s. Pt is G9/P1304. I reviewed her allergies, medications, Medical/Surgical/OB history, and appropriate screenings. I informed her of [facility] services. Based on history, this is a/an complicated pregnancy due to history of hyperthyroidism and hx of preterm delivery x 4.  ? ?Patient Active Problem List  ? Diagnosis Date Noted  ? Marginal insertion of umbilical cord affecting management of mother 08/03/2019  ? Supervision of other normal pregnancy, antepartum 06/27/2019  ? History of preterm delivery, currently pregnant 06/27/2019  ? Abnormal Pap smear of cervix 05/30/2018  ? Dysmenorrhea 11/01/2012  ? Depressive disorder, not elsewhere classified 10/03/2012  ? ? ?Concerns addressed today ? ?Delivery Plans:  ?Plans to deliver at [facility].  ? ?MyChart/Babyscripts ?MyChart access verified. I explained pt will have some visits in office and some virtually. Babyscripts instructions given and order placed. Patient verifies receipt of registration text/e-mail. Account successfully created and app downloaded. ? ?Blood Pressure Cuff  ?Blood pressure cuff ordered for patient to pick-up from [employer]. Explained after first prenatal appt pt will check weekly and document in Babyscripts. ? ?Weight scale: Patient does have weight scale. ? ?Anatomy  US ?Explained first scheduled [place] will be around 19 weeks. Dating and viability scan performed today. ? ?Labs ?Discussed Avelina Laine genetic screening with patient. Would like both Panorama and Horizon drawn at new OB visit.Also if interested in genetic testing, tell patient she will need AFP 15-21 weeks to complete genetic testing .Routine prenatal labs needed. ? ?Covid Vaccine ?Patient has not covid vaccine.  ? ?Is patient a CenteringPregnancy candidate?  ?Declined ?Declined due to Group Setting ?Not a candidate due to NAIs patient a CenteringPregnancy candidate? Declined due to Not interested   "Centering Patient" indicated on sticky note ?  ?Is patient a Mom+Baby Combined Care candidate?  ?Declined   ?  ?Is patient interested in [place]?  ?Yes  ? "Interested in [employer] - Schedule next visit with CNM" on sticky note ? ?Informed patient of [facility] website  and placed link in her AVS.  ? ?Social Determinants of Health ?Food Insecurity: Patient denies food insecurity. ?WIC Referral: Patient is interested in [facility] to [facility].  ?Transportation: Patient denies transportation needs. ?Childcare: Discussed no children allowed at ultrasound appointments. Offered childcare services; patient expresses need for childcare services. Childcare scheduled for appropriate appointments and information given to patient. ? ?Send link to Pregnancy Navigators ? ? ?Placed OB Box on problem list and updated ? ?First visit review ?I reviewed new OB appt with pt. I explained she will have a pelvic exam, ob bloodwork with genetic screening, and PAP smear. Explained pt will be seen by Nettie Elm at first visit; encounter routed to appropriate provider. Explained that patient will be seen by pregnancy navigator following visit with provider. [facility] information placed in AVS.  ? ?Hamilton Capri, RN ?  10/24/2021  10:05 AM  ?

## 2021-10-24 NOTE — Progress Notes (Signed)
Agree with nurses's documentation of this patient's clinic encounter.  Lulamae Skorupski L, MD  

## 2021-10-24 NOTE — BH Specialist Note (Signed)
Integrated Behavioral Health Initial In-Person Visit ? ?MRN: [id number] ?Name: Judy [name] ? ?Number of Integrated Behavioral Health Clinician visits: 1 ?Session Start time: 1120am ?Session End time: 1139am ?Total time in minutes: 19 min in[facility]person at [facility]  ? ?Types of Service: Individual psychotherapy ? ?Interpretor:No. Interpretor Name and Language: none ? ? Warm Hand Off Completed. ? ?  ? ?  ? ? ?Subjective: ?Judy [name] is a 26 y.o. female accompanied by  ?N/a ?Patient was referred by A Burch RN for hx of postpartum depression and stress. ?Patient reports the following symptoms/concerns: situational stress, depressed mood, feeling numb, limited family and social support  ?Duration of problem: over one year; Severity of problem: mild ? ?Objective: ?Mood: Depressed and Affect: Depressed ?Risk of harm to self or others: No plan to harm self or others ? ?Life Context: ?Family and Social: Lives with children in [place]  ?School/Work: Cleaning service  ?Self-Care: n/ad ?Life Changes: new pregnancy ? ?Patient and/or Family's Strengths/Protective Factors: ?Concrete supports in place (healthy food, safe environments, etc.) ? ?Goals Addressed: ?Patient will: ?Reduce symptoms of: depression ?Increase knowledge and/or ability of: coping skills  ?Demonstrate ability to: Increase healthy adjustment to current life circumstances ? ?Progress towards Goals: ?Ongoing ? ?Interventions: ?Interventions utilized: Supportive Counseling  ?Standardized Assessments completed: PHQ 9 ? ?Patient and/or Family Response: Judy [name] responded well to visit.   ? ?Assessment: ?Patient currently experiencing depression. ?  ?Patient may benefit from integrated behavioral health. ? ?Plan: ?Follow up with behavioral health clinician on : as needed  ?Behavioral recommendations: prioritize rest, contact daycare resources and [facility] for caregiver support  ?Referral(s): Integrated [facility] (In  Clinic) and [employer]:  daycare ?"From scale of 1-10, how likely are you to follow plan?":   ? ?Gwyndolyn Saxon, LCSW ? ? ? ? ? ? ? ? ?

## 2021-10-25 LAB — CBC/D/PLT+RPR+RH+ABO+RUBIGG...
Antibody Screen: NEGATIVE
Basophils Absolute: 0 10*3/uL (ref 0.0–0.2)
Basos: 0 %
EOS (ABSOLUTE): 0 10*3/uL (ref 0.0–0.4)
Eos: 0 %
HCV Ab: NONREACTIVE
HIV Screen 4th Generation wRfx: NONREACTIVE
Hematocrit: 34.1 % (ref 34.0–46.6)
Hemoglobin: 11.9 g/dL (ref 11.1–15.9)
Hepatitis B Surface Ag: NEGATIVE
Immature Grans (Abs): 0 10*3/uL (ref 0.0–0.1)
Immature Granulocytes: 0 %
Lymphocytes Absolute: 1.6 10*3/uL (ref 0.7–3.1)
Lymphs: 25 %
MCH: 27.4 pg (ref 26.6–33.0)
MCHC: 34.9 g/dL (ref 31.5–35.7)
MCV: 79 fL (ref 79–97)
Monocytes Absolute: 0.4 10*3/uL (ref 0.1–0.9)
Monocytes: 6 %
Neutrophils Absolute: 4.4 10*3/uL (ref 1.4–7.0)
Neutrophils: 69 %
Platelets: 282 10*3/uL (ref 150–450)
RBC: 4.34 x10E6/uL (ref 3.77–5.28)
RDW: 12.8 % (ref 11.7–15.4)
RPR Ser Ql: NONREACTIVE
Rh Factor: POSITIVE
Rubella Antibodies, IGG: 4.48 index (ref >0.99)
WBC: 6.5 10*3/uL (ref 3.4–10.8)

## 2021-10-25 LAB — COMPREHENSIVE METABOLIC PANEL
ALT: 10 IU/L (ref 0–32)
AST: 17 IU/L (ref 0–40)
Albumin/Globulin Ratio: 1.4 (ref 1.2–2.2)
Albumin: 4.4 g/dL (ref 3.9–5.0)
Alkaline Phosphatase: 38 IU/L — ABNORMAL LOW (ref 44–121)
BUN/Creatinine Ratio: 15 (ref 9–23)
BUN: 8 mg/dL (ref 6–20)
Bilirubin Total: 0.4 mg/dL (ref 0.0–1.2)
CO2: 20 mmol/L (ref 20–29)
Calcium: 10.1 mg/dL (ref 8.7–10.2)
Chloride: 102 mmol/L (ref 96–106)
Creatinine, Ser: 0.54 mg/dL — ABNORMAL LOW (ref 0.57–1.00)
Globulin, Total: 3.2 g/dL (ref 1.5–4.5)
Glucose: 79 mg/dL (ref 70–99)
Potassium: 4.1 mmol/L (ref 3.5–5.2)
Sodium: 138 mmol/L (ref 134–144)
Total Protein: 7.6 g/dL (ref 6.0–8.5)
eGFR: 130 mL/min/{1.73_m2} (ref >59)

## 2021-10-25 LAB — THYROID PANEL WITH TSH
Free Thyroxine Index: 8.4 — ABNORMAL HIGH (ref 1.2–4.9)
T3 Uptake Ratio: 36 % (ref 24–39)
T4, Total: 23.2 ug/dL (ref 4.5–12.0)
TSH: <0.005 u[IU]/mL — ABNORMAL LOW (ref 0.450–4.500)

## 2021-10-25 LAB — HEMOGLOBIN A1C
Est. average glucose Bld gHb Est-mCnc: 103 mg/dL
Hgb A1c MFr Bld: 5.2 % (ref 4.8–5.6)

## 2021-10-25 LAB — HCV INTERPRETATION

## 2021-10-26 LAB — PROTEIN / CREATININE RATIO, URINE
Creatinine, Urine: 280.7 mg/dL
Protein, Ur: 25.7 mg/dL
Protein/Creat Ratio: 92 mg/g creat (ref 0–200)

## 2021-10-26 LAB — URINE CULTURE, OB REFLEX

## 2021-10-26 LAB — CULTURE, OB URINE

## 2021-10-27 ENCOUNTER — Other Ambulatory Visit: Payer: Self-pay | Admitting: Obstetrics and Gynecology

## 2021-10-27 ENCOUNTER — Encounter: Payer: Self-pay | Admitting: Obstetrics and Gynecology

## 2021-10-27 DIAGNOSIS — E0590 Thyrotoxicosis, unspecified without thyrotoxic crisis or storm: Secondary | ICD-10-CM

## 2021-10-27 LAB — CERVICOVAGINAL ANCILLARY ONLY
Bacterial Vaginitis (gardnerella): NEGATIVE
Candida Glabrata: NEGATIVE
Candida Vaginitis: POSITIVE — AB
Chlamydia: NEGATIVE
Comment: NEGATIVE
Comment: NEGATIVE
Comment: NEGATIVE
Comment: NEGATIVE
Comment: NEGATIVE
Comment: NORMAL
Neisseria Gonorrhea: NEGATIVE
Trichomonas: NEGATIVE

## 2021-11-03 ENCOUNTER — Encounter: Payer: Self-pay | Admitting: Obstetrics and Gynecology

## 2021-11-19 ENCOUNTER — Encounter: Payer: Medicaid Other | Admitting: Obstetrics and Gynecology

## 2021-11-25 ENCOUNTER — Telehealth: Payer: Self-pay | Admitting: Emergency Medicine

## 2021-11-25 ENCOUNTER — Encounter: Payer: Self-pay | Admitting: Obstetrics and Gynecology

## 2021-11-25 NOTE — Telephone Encounter (Signed)
RC to pt who called afterhours line reporting weakness and inability to stand.  After hours nurse instructed pt to present to ED for evaluation. Unable to reach pt via phone.

## 2021-11-27 ENCOUNTER — Encounter: Payer: Medicaid Other | Admitting: Obstetrics

## 2021-12-10 ENCOUNTER — Telehealth: Payer: Self-pay

## 2022-01-26 ENCOUNTER — Encounter: Payer: Self-pay | Admitting: Obstetrics and Gynecology

## 2022-01-26 ENCOUNTER — Ambulatory Visit (INDEPENDENT_AMBULATORY_CARE_PROVIDER_SITE_OTHER): Payer: Medicaid Other | Admitting: Obstetrics and Gynecology

## 2022-01-26 ENCOUNTER — Other Ambulatory Visit (HOSPITAL_COMMUNITY)
Admission: RE | Admit: 2022-01-26 | Discharge: 2022-01-26 | Disposition: A | Discharge status: Home / Self Care | Payer: Medicaid Other | Source: Ambulatory Visit | Attending: Obstetrics and Gynecology | Admitting: Obstetrics and Gynecology

## 2022-01-26 VITALS — BP 93/56 | HR 80 | Wt 134.0 lb

## 2022-01-26 DIAGNOSIS — E0590 Thyrotoxicosis, unspecified without thyrotoxic crisis or storm: Secondary | ICD-10-CM | POA: Diagnosis not present

## 2022-01-26 DIAGNOSIS — O09892 Supervision of other high risk pregnancies, second trimester: Secondary | ICD-10-CM

## 2022-01-26 DIAGNOSIS — O0992 Supervision of high risk pregnancy, unspecified, second trimester: Secondary | ICD-10-CM | POA: Diagnosis not present

## 2022-01-26 DIAGNOSIS — O99282 Endocrine, nutritional and metabolic diseases complicating pregnancy, second trimester: Secondary | ICD-10-CM

## 2022-01-26 DIAGNOSIS — O0990 Supervision of high risk pregnancy, unspecified, unspecified trimester: Secondary | ICD-10-CM | POA: Diagnosis not present

## 2022-01-26 DIAGNOSIS — Z3A24 24 weeks gestation of pregnancy: Secondary | ICD-10-CM

## 2022-01-26 DIAGNOSIS — O09899 Supervision of other high risk pregnancies, unspecified trimester: Secondary | ICD-10-CM

## 2022-01-26 NOTE — Progress Notes (Signed)
NOB, prefers to do PAP later because "the last time she did it during pregnancy it resulted in bad contractions".  C/o discharge, itching, burning irritation x 3 days.  Zoloft causes her to vomit.

## 2022-01-26 NOTE — Patient Instructions (Signed)

## 2022-01-26 NOTE — Progress Notes (Signed)
Subjective:  Judy [name] is a 26 y.o. [phone] at [redacted]w[redacted]d being seen today for first OB visit. EDD by first trimester U/S. H/O PTD x 4. Hyperthyroidism without treatment.  She is currently monitored for the following issues for this high-risk pregnancy and has Depressive disorder, not elsewhere classified; Abnormal Pap smear of cervix; Supervision of other normal pregnancy, antepartum; History of preterm delivery, currently pregnant; Supervision of high risk pregnancy, antepartum; and Hyperthyroidism affecting pregnancy on their problem list.  Patient reports no complaints.  Contractions: Irritability. Vag. Bleeding: None.  Movement: Present. Denies leaking of fluid.   The following portions of the patient's history were reviewed and updated as appropriate: allergies, current medications, past family history, past medical history, past social history, past surgical history and problem list. Problem list updated.  Objective:   Vitals:   01/26/22 1501  BP: (!) 93/56  Pulse: 80  Weight: 134 lb (60.8 kg)    Fetal Status: Fetal Heart Rate (bpm): 152 Fundal Height: 25 cm Movement: Present     General:  Alert, oriented and cooperative. Patient is in no acute distress.  Skin: Skin is warm and dry. No rash noted.   Cardiovascular: Normal heart rate noted  Respiratory: Normal respiratory effort, no problems with respiration noted  Abdomen: Soft, gravid, appropriate for gestational age. Pain/Pressure: Absent     Pelvic:  Cervical exam performed        Extremities: Normal range of motion.  Edema: Trace  Mental Status: Normal mood and affect. Normal behavior. Normal judgment and thought content.   Urinalysis:      Assessment and Plan:  Pregnancy: [id number] at [redacted]w[redacted]d  1. Supervision of high risk pregnancy, antepartum Prenatal labs and care reviewed with pt - Cytology - PAP( [place]) - Cervicovaginal ancillary only( [place]) - [place] MFM OB DETAIL +14 WK; Future  2. Hyperthyroidism  affecting pregnancy in second trimester  - Ambulatory referral to Endocrinology - Thyroid Panel With TSH  3. History of preterm delivery, currently pregnant Stable. CL with anatomy scan All deliveries 35-37 weeks  Considering IUD or BTL Discuss BTL papers at next visit if indicated  Preterm labor symptoms and general obstetric precautions including but not limited to vaginal bleeding, contractions, leaking of fluid and fetal movement were reviewed in detail with the patient. Please refer to After Visit Summary for other counseling recommendations.  Return in about 4 weeks (around 02/23/2022) for OB visit, face to face, MD only.   Hermina Staggers, MD

## 2022-01-27 LAB — THYROID PANEL WITH TSH
Free Thyroxine Index: 2.6 (ref 1.2–4.9)
T3 Uptake Ratio: 16 % — ABNORMAL LOW (ref 24–39)
T4, Total: 16 ug/dL — ABNORMAL HIGH (ref 4.5–12.0)
TSH: 0.108 u[IU]/mL — ABNORMAL LOW (ref 0.450–4.500)

## 2022-01-27 LAB — CERVICOVAGINAL ANCILLARY ONLY
Bacterial Vaginitis (gardnerella): NEGATIVE
Candida Glabrata: NEGATIVE
Candida Vaginitis: POSITIVE — AB
Chlamydia: NEGATIVE
Comment: NEGATIVE
Comment: NEGATIVE
Comment: NEGATIVE
Comment: NEGATIVE
Comment: NEGATIVE
Comment: NORMAL
Neisseria Gonorrhea: NEGATIVE
Trichomonas: NEGATIVE

## 2022-01-28 ENCOUNTER — Other Ambulatory Visit: Payer: Self-pay | Admitting: *Deleted

## 2022-01-28 MED ORDER — TERCONAZOLE 0.4 % VA CREA
1.0000 | TOPICAL_CREAM | Freq: Every day | VAGINAL | 0 refills | Status: DC
Start: 2022-01-28 — End: 2022-03-02

## 2022-01-28 NOTE — Progress Notes (Signed)
Tx for yeast sent today. See lab results

## 2022-01-29 LAB — CYTOLOGY - PAP
Comment: NEGATIVE
Diagnosis: NEGATIVE
High risk HPV: NEGATIVE

## 2022-02-20 ENCOUNTER — Ambulatory Visit: Payer: Medicaid Other | Admitting: *Deleted

## 2022-02-20 ENCOUNTER — Encounter: Payer: Self-pay | Admitting: *Deleted

## 2022-02-20 ENCOUNTER — Other Ambulatory Visit: Payer: Self-pay | Admitting: *Deleted

## 2022-02-20 ENCOUNTER — Ambulatory Visit: Payer: Medicaid Other | Attending: Obstetrics and Gynecology

## 2022-02-20 ENCOUNTER — Ambulatory Visit (HOSPITAL_BASED_OUTPATIENT_CLINIC_OR_DEPARTMENT_OTHER): Payer: Medicaid Other | Admitting: Maternal & Fetal Medicine

## 2022-02-20 VITALS — BP 108/55 | HR 95

## 2022-02-20 DIAGNOSIS — O0990 Supervision of high risk pregnancy, unspecified, unspecified trimester: Secondary | ICD-10-CM | POA: Diagnosis not present

## 2022-02-20 DIAGNOSIS — Z3401 Encounter for supervision of normal first pregnancy, first trimester: Secondary | ICD-10-CM | POA: Diagnosis not present

## 2022-02-20 DIAGNOSIS — J45909 Unspecified asthma, uncomplicated: Secondary | ICD-10-CM | POA: Diagnosis not present

## 2022-02-20 DIAGNOSIS — Z3A28 28 weeks gestation of pregnancy: Secondary | ICD-10-CM | POA: Diagnosis not present

## 2022-02-20 DIAGNOSIS — Z363 Encounter for antenatal screening for malformations: Secondary | ICD-10-CM | POA: Diagnosis not present

## 2022-02-20 DIAGNOSIS — O0933 Supervision of pregnancy with insufficient antenatal care, third trimester: Secondary | ICD-10-CM | POA: Diagnosis not present

## 2022-02-20 DIAGNOSIS — O99513 Diseases of the respiratory system complicating pregnancy, third trimester: Secondary | ICD-10-CM | POA: Insufficient documentation

## 2022-02-20 DIAGNOSIS — Z8639 Personal history of other endocrine, nutritional and metabolic disease: Secondary | ICD-10-CM

## 2022-02-20 DIAGNOSIS — O09293 Supervision of pregnancy with other poor reproductive or obstetric history, third trimester: Secondary | ICD-10-CM | POA: Diagnosis not present

## 2022-02-20 DIAGNOSIS — O09893 Supervision of other high risk pregnancies, third trimester: Secondary | ICD-10-CM

## 2022-02-20 DIAGNOSIS — F419 Anxiety disorder, unspecified: Secondary | ICD-10-CM | POA: Diagnosis not present

## 2022-02-20 DIAGNOSIS — O99343 Other mental disorders complicating pregnancy, third trimester: Secondary | ICD-10-CM | POA: Diagnosis not present

## 2022-02-20 DIAGNOSIS — O0943 Supervision of pregnancy with grand multiparity, third trimester: Secondary | ICD-10-CM

## 2022-02-20 DIAGNOSIS — O09899 Supervision of other high risk pregnancies, unspecified trimester: Secondary | ICD-10-CM

## 2022-02-20 DIAGNOSIS — O99283 Endocrine, nutritional and metabolic diseases complicating pregnancy, third trimester: Secondary | ICD-10-CM | POA: Diagnosis not present

## 2022-02-20 NOTE — Progress Notes (Unsigned)
MFM Consult Note Patient Name: Judy [name]  Patient MRN:   [id number]  Referring provider: Haskel Khan  Reason for Consult: Graves disease   HPI: Judy [name] is a 26 y.o. [id number] at [redacted]w[redacted]d by [redacted]w[redacted]d [place] here for ultrasound and consultation.   Review of Systems: A review of systems was performed and was negative except per HPI.  Vitals Blood pressure: 108/55, Pulse: 95, Prepregnancy BMI: 22.6  Genetic testing: Low risk and IPS RE Graves Disease:The patient reports a history of hyperthyroidism diagnosed in 2020at age 18.   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  This occurred in [place] and now she has moved here to [facility] and follows with her family physician.  Since she became pregnant she has not been taking her thyroid medication and her labs show that she has elevated T3 and T4 with a low TSH.  Her OB provider has referred her to endocrinology but she has not heard from them.   Currently her symptoms consist of occasional chest palpitations, ophthalmia and nervousness.  Her heart rate is normal today   I discussed the diagnosis, management and the fact of hyperthyroidism on pregnancy.  Hyperthyroidism occurs in less than 1% of pregnancies or Graves' disease accounts for 95% of these cases.  I discussed that inadequately treated maternal thyrotoxicosis is associated with increased risks of preeclampsia with severe features, maternal heart failure and thyroid storm.  I discussed that fetal outcomes are dependent upon the degree of metabolic control is achieved during pregnancy.  I discussed that inadequately treated hypothyroidism is associated with an increased risk of medically indicated preterm birth, low birthweight, miscarriage and stillbirth.    I discussed that the fetal risk of Graves' disease or not related to the disease itself or to the diamide treatment, but rather is related due to the thyroid antibodies that cross the placenta and  activate thyroid receptors in the fetus.  The fetal thyroid is typically active around 10 to 12 weeks where it starts to concentrate iodide and can be affected by poorly controlled or overtreated maternal hypothyroidism at this time.  Rarely does ultrasound show a fetal goiter but it can be evidence of over or under treatment of hyperthyroidism.  Approximately 1 to 5% of newborns will have neonatal Graves' disease due to transplacental passage of maternal thyroid-stimulating immunoglobulin.  Importantly newborns have a delayed clearance of maternal antibodies resulting in a delayed presentation.  Moreover, detection of thyroid antibodies in the newborn serum may be due to maternal transplacental transfer if done around the time of delivery.  I discussed the importance of pediatric notification and monitoring of the newborn after birth.    We discussed that treatment during pregnancy involves laboratory monitoring and initiation of thionamides either with PTU or methimazole depending upon the trimester.  I discussed the goal is to have the free T4 in the upper limit of normal.  Methimazole is usually avoided in the first trimester due to teratogen association with esophageal or choanal atresia as well as a plasia cutis congenital skin defects.  For this reason PTU is the preferred drug of choice in the first trimester.  After the first trimester either methimazole or PTU can be used for treatment of hyperthyroidism.  PTU was associated with rare but clinically significant hepatotoxicity.  The dose ratio of PTU to methimazole is 2021.  Transient leukopenia occurs under 10% in pregnant women taking thyroid drugs but this does not require therapy cessation unless agranulocytosis  develops.  Because development of agranulocytosis is not dose-related and occurs suddenly, serial labs are not helpful.  However, I informed the patient that she should report if she develops a fever or sore throat and promptly discontinue the  medication until a CBC can be performed.  The starting dose for PTU is 100 to 600 mg daily divided into 3 doses.  A typical dose averages 200 to 40 mg daily.  If methimazole is he is initial doses 5 to 30 mg orally divided by 2 doses but is frequently reduce to 1 daily dose when the correct maintenance dose is achieved.  The goal should be the lowest possible dose to maintain a free T4 slightly above or in the normal high range.  The TSH level is not used to monitor dose.  If there is T3 thyrotoxicosis the total T3 should be monitored.  Beta-blockers can be used as an adjunct of therapy to treat tachycardia.  The preferred agent is propanolol initiated at 10 to 40 mg 3-4 times a day.  I discussed the signs and symptoms of thyroid storm with the patient and to seek medical care if she develops any of the symptoms.  Plan Since the patient has not been able to get in with endocrinology promptly and her laboratory work shows she is uncontrolled I recommend she methimazole at her next prenatal visit.  A potential starting dose would be 5 to 10 mg twice a day. Repeat thyroid panel every 4 weeks until her dose is appropriate to achieve a free T4 and total T3 in the upper limit of normal or slightly above normal. Serial growth ultrasounds every 4 weeks until delivery Antenatal testing to start at 32 weeks, weekly until delivery Delivery timing typically around 39 weeks. Signs of thyroid storm were reviewed and the patient verbalized understanding and she verbalized understanding to seek medical attention if she has concerns Potential complication of agranulocytosis was also reviewed with patient and she verbalized understanding to seek medical attention if she has concerns    I spent 35 minutes reviewing the patients chart, including labs and images as well as counseling the patient about her medical conditions.  Braxton Feathers  MFM,    02/20/2022  5:02 PM

## 2022-02-25 ENCOUNTER — Other Ambulatory Visit: Payer: Self-pay | Admitting: *Deleted

## 2022-02-25 ENCOUNTER — Telehealth: Payer: Self-pay | Admitting: *Deleted

## 2022-02-25 DIAGNOSIS — O99282 Endocrine, nutritional and metabolic diseases complicating pregnancy, second trimester: Secondary | ICD-10-CM

## 2022-02-25 MED ORDER — METHIMAZOLE 5 MG PO TABS: 5.0000 mg | ORAL_TABLET | Freq: Two times a day (BID) | ORAL | 0 refills | Status: DC

## 2022-02-25 NOTE — Progress Notes (Signed)
Methimazole 5mg  BID ordered per request Dr. in results basket.

## 2022-02-25 NOTE — Progress Notes (Signed)
RX methimazole sent. Message forwarded to referral coordinator to follow up on endocrinology referral made 01/26/22.

## 2022-02-25 NOTE — Telephone Encounter (Signed)
Returned TC to pt. Pt concerned with: need to be "out of work" per MFM, decreased appetite, and uncertain if can "stomach" glucola tomorrow. Advised to discuss need for FMLA vs restrictions with MD at appt 02/26/22. Advised to take Diclegis as prescribed tonight before bedtime and to eat small frequent meals throughout the day today. Advised to present for GTT appt as scheduled and we will evaluate at that time. Advised of RX methimazole sent for hyperthyroidism per MFM and Dr. Alysia Penna. All questions answered. Pt verbalized understanding.

## 2022-02-26 ENCOUNTER — Emergency Department (HOSPITAL_COMMUNITY)
Admission: EM | Admit: 2022-02-26 | Discharge: 2022-02-26 | Disposition: A | Discharge status: Home / Self Care | Payer: Medicaid Other | Attending: Student | Admitting: Student

## 2022-02-26 ENCOUNTER — Other Ambulatory Visit: Payer: Medicaid Other

## 2022-02-26 ENCOUNTER — Other Ambulatory Visit: Payer: Self-pay

## 2022-02-26 ENCOUNTER — Encounter: Payer: Medicaid Other | Admitting: Obstetrics & Gynecology

## 2022-02-26 DIAGNOSIS — O99283 Endocrine, nutritional and metabolic diseases complicating pregnancy, third trimester: Secondary | ICD-10-CM | POA: Insufficient documentation

## 2022-02-26 DIAGNOSIS — N9489 Other specified conditions associated with female genital organs and menstrual cycle: Secondary | ICD-10-CM | POA: Diagnosis not present

## 2022-02-26 DIAGNOSIS — E0590 Thyrotoxicosis, unspecified without thyrotoxic crisis or storm: Secondary | ICD-10-CM

## 2022-02-26 DIAGNOSIS — Z3A30 30 weeks gestation of pregnancy: Secondary | ICD-10-CM | POA: Diagnosis not present

## 2022-02-26 DIAGNOSIS — E876 Hypokalemia: Secondary | ICD-10-CM | POA: Insufficient documentation

## 2022-02-26 DIAGNOSIS — O26893 Other specified pregnancy related conditions, third trimester: Secondary | ICD-10-CM | POA: Diagnosis present

## 2022-02-26 LAB — TROPONIN I (HIGH SENSITIVITY)
Troponin I (High Sensitivity): 3 ng/L (ref <18)
Troponin I (High Sensitivity): 3 ng/L (ref <18)

## 2022-02-26 LAB — COMPREHENSIVE METABOLIC PANEL
ALT: 8 U/L (ref 0–44)
AST: 18 U/L (ref 15–41)
Albumin: 2.6 g/dL — ABNORMAL LOW (ref 3.5–5.0)
Alkaline Phosphatase: 131 U/L — ABNORMAL HIGH (ref 38–126)
Anion gap: 8 (ref 5–15)
BUN: <5 mg/dL — ABNORMAL LOW (ref 6–20)
CO2: 21 mmol/L — ABNORMAL LOW (ref 22–32)
Calcium: 8.8 mg/dL — ABNORMAL LOW (ref 8.9–10.3)
Chloride: 106 mmol/L (ref 98–111)
Creatinine, Ser: 0.52 mg/dL (ref 0.44–1.00)
GFR, Estimated: >60 mL/min (ref >60)
Glucose, Bld: 82 mg/dL (ref 70–99)
Potassium: 3.2 mmol/L — ABNORMAL LOW (ref 3.5–5.1)
Sodium: 135 mmol/L (ref 135–145)
Total Bilirubin: 0.3 mg/dL (ref 0.3–1.2)
Total Protein: 6.8 g/dL (ref 6.5–8.1)

## 2022-02-26 LAB — CBC WITH DIFFERENTIAL/PLATELET
Abs Immature Granulocytes: 0.03 10*3/uL (ref 0.00–0.07)
Basophils Absolute: 0 10*3/uL (ref 0.0–0.1)
Basophils Relative: 0 %
Eosinophils Absolute: 0.1 10*3/uL (ref 0.0–0.5)
Eosinophils Relative: 2 %
HCT: 33.4 % — ABNORMAL LOW (ref 36.0–46.0)
Hemoglobin: 10.7 g/dL — ABNORMAL LOW (ref 12.0–15.0)
Immature Granulocytes: 1 %
Lymphocytes Relative: 18 %
Lymphs Abs: 1.2 10*3/uL (ref 0.7–4.0)
MCH: 24.8 pg — ABNORMAL LOW (ref 26.0–34.0)
MCHC: 32 g/dL (ref 30.0–36.0)
MCV: 77.5 fL — ABNORMAL LOW (ref 80.0–100.0)
Monocytes Absolute: 0.8 10*3/uL (ref 0.1–1.0)
Monocytes Relative: 12 %
Neutro Abs: 4.4 10*3/uL (ref 1.7–7.7)
Neutrophils Relative %: 67 %
Platelets: 154 10*3/uL (ref 150–400)
RBC: 4.31 MIL/uL (ref 3.87–5.11)
RDW: 12.6 % (ref 11.5–15.5)
WBC: 6.5 10*3/uL (ref 4.0–10.5)
nRBC: 0 % (ref 0.0–0.2)

## 2022-02-26 LAB — HCG, QUANTITATIVE, PREGNANCY: hCG, Beta Chain, Quant, S: 59209 m[IU]/mL — ABNORMAL HIGH (ref <5)

## 2022-02-26 LAB — TSH: TSH: 0.034 u[IU]/mL — ABNORMAL LOW (ref 0.350–4.500)

## 2022-02-26 LAB — T4, FREE: Free T4: 0.83 ng/dL (ref 0.61–1.12)

## 2022-02-26 MED ORDER — METHIMAZOLE 5 MG PO TABS: 5.0000 mg | ORAL_TABLET | Freq: Two times a day (BID) | ORAL | 0 refills | Status: DC

## 2022-02-26 MED ORDER — METHIMAZOLE 5 MG PO TABS
5.0000 mg | ORAL_TABLET | Freq: Once | ORAL | Status: AC
  Administered 2022-02-26: 5 mg via ORAL
  Filled 2022-02-26: qty 1

## 2022-02-26 NOTE — Progress Notes (Signed)
Dr Despina Hidden notified of patient status, EFM tracing and complaints.  Suggested cardiology consult due to past history.  Discussed with Dr Posey Rea in [facility].  States once labs result if any cardiac abnormalities will order consult.

## 2022-02-26 NOTE — ED Notes (Signed)
All discharge instructions including follow up care and prescriptions reviewed with patient and patient verbalized understanding of same. Patient stable and ambulatory at time of discharge.  

## 2022-02-26 NOTE — Progress Notes (Signed)
FHR 145 with good variability, 10x10 accels noted, occasional variable and 3 ctx's noted since fetal monitoring initiated.  Patient continues to complain of chest pain.  Awaiting lab results.

## 2022-02-26 NOTE — ED Provider Triage Note (Signed)
Emergency Medicine Provider Triage Evaluation Note  Judy [name] , a 26 y.o. female  was evaluated in triage.  Pt complains of chest pain which started last night.  Substernal, sometimes it radiates to her back.  She is [redacted] weeks pregnant, when she gets up she feels like she is having contractions.  Denies any vaginal bleeding or burst of amniotic fluid.  History of thyroid storm, prescribed methimazole but she has not started taking it.  Feels similar to previous episode of thyroid storm during her last pregnancy..  Review of Systems  Per HPI  Physical Exam  BP 113/62 (BP Location: Right Arm)   Pulse (!) 105   Temp 98.5 F (36.9 C) (Oral)   Resp 16   LMP 08/18/2021 (Exact Date)   SpO2 100%  Gen:   Awake, no distress   Resp:  Normal effort  MSK:   Moves extremities without difficulty  Other:  Gravid abdomen, regular rhythm but slightly tachycardic  Medical Decision Making  Medically screening exam initiated at 6:38 PM.  Appropriate orders placed.  Vinie Sill was informed that the remainder of the evaluation will be completed by another provider, this initial triage assessment does not replace that evaluation, and the importance of remaining in the ED until their evaluation is complete.  Spoke with MAU APP Melanie, patient needs to be worked up for chest pain and thyroid before excepting at MAU.  After response advised given symptoms consistent with possible contractions.   Theron Arista, PA-C 02/26/22 1839

## 2022-02-26 NOTE — ED Provider Notes (Signed)
[facility] EMERGENCY DEPARTMENT Provider Note  CSN: [id number] Arrival date & time: 02/26/22 1810  Chief Complaint(s) Chest Pain  HPI Judy [name] is a 26 y.o. female with PMH Graves' disease and associated hyperthyroidism with previous history of thyroid storm during last pregnancy with associated cardiac arrest (?)  Who presents emergency department from the MAU due to evaluation of multiple complaints including back pain, chest pain, generalized fatigue, shortness of breath.  Patient is [redacted] weeks pregnant with confirmed intrauterine pregnancy and MAU primarily concerned about current thyroid storm given her previous high risk history.  Patient arrives normotensive with no tachycardia and no fever.  She denies nausea, vomiting, diarrhea or other systemic symptoms.  Patient has been followed by her OB/GYN Dr. Rip Harbour who prescribed the patient methimazole but she has not picked up this medication yet.   Past Medical History Past Medical History:  Diagnosis Date   Anxiety    Asthma    History of preterm delivery    Hyperthyroidism    Post partum depression    Vaginal Pap smear, abnormal    Yeast infection of the vagina    Patient Active Problem List   Diagnosis Date Noted   Hyperthyroidism affecting pregnancy 10/27/2021   Supervision of high risk pregnancy, antepartum 10/24/2021   Supervision of other normal pregnancy, antepartum 06/27/2019   History of preterm delivery, currently pregnant 06/27/2019   Abnormal Pap smear of cervix 05/30/2018   Depressive disorder, not elsewhere classified 10/03/2012   Home Medication(s) Prior to Admission medications   Medication Sig Start Date End Date Taking? Authorizing Provider  acetaminophen (TYLENOL) 500 MG tablet Take 500-1,000 mg by mouth as needed for mild pain or moderate pain.   Yes [provider]  Blood Pressure Monitoring (BLOOD PRESSURE KIT) DEVI 1 kit by Does not apply route once a week. 10/24/21  Yes  Chancy Milroy, MD  terconazole (TERAZOL 7) 0.4 % vaginal cream Place 1 applicator vaginally at bedtime. 01/28/22  Yes Chancy Milroy, MD  methimazole (TAPAZOLE) 5 MG tablet Take 1 tablet (5 mg total) by mouth 2 (two) times daily. 02/26/22   Michiah Masse, Debe Coder, MD                                                                                                                                    Past Surgical History Past Surgical History:  Procedure Laterality Date   NO PAST SURGERIES     Family History Family History  Problem Relation Age of Onset   Hypertension Mother    Diabetes Mother    Anxiety disorder Mother    Asthma Mother    Breast cancer Mother    Stomach cancer Mother    Brain cancer Mother    Asthma Father    Hypertension Maternal Grandfather    Heart disease Paternal Grandmother    Heart disease Paternal Grandfather    Hypertension Paternal Aunt  Social History Social History   Tobacco Use   Smoking status: Never   Smokeless tobacco: Never  Vaping Use   Vaping Use: Never used  Substance Use Topics   Alcohol use: Not Currently   Drug use: Not Currently   Allergies Penicillins, Latex, and Tape  Review of Systems Review of Systems  Constitutional:  Positive for fatigue.  Cardiovascular:  Positive for chest pain.  Musculoskeletal:  Positive for back pain.    Physical Exam Vital Signs  I have reviewed the triage vital signs BP 110/74 (BP Location: Right Arm)   Pulse 89   Temp 98.1 F (36.7 C) (Oral)   Resp 16   LMP 08/18/2021 (Exact Date)   SpO2 99%   Physical Exam Vitals and nursing note reviewed.  Constitutional:      General: She is not in acute distress.    Appearance: She is well-developed.  HENT:     Head: Normocephalic and atraumatic.  Eyes:     Conjunctiva/sclera: Conjunctivae normal.  Cardiovascular:     Rate and Rhythm: Normal rate and regular rhythm.     Heart sounds: No murmur heard. Pulmonary:     Effort: Pulmonary effort is  normal. No respiratory distress.     Breath sounds: Normal breath sounds.  Abdominal:     Palpations: Abdomen is soft.     Tenderness: There is no abdominal tenderness.     Comments: Gravid uterus  Musculoskeletal:        General: No swelling.     Cervical back: Neck supple.  Skin:    General: Skin is warm and dry.     Capillary Refill: Capillary refill takes less than 2 seconds.  Neurological:     Mental Status: She is alert.  Psychiatric:        Mood and Affect: Mood normal.     ED Results and Treatments Labs (all labs ordered are listed, but only abnormal results are displayed) Labs Reviewed  HCG, QUANTITATIVE, PREGNANCY - Abnormal; Notable for the following components:      Result Value   hCG, Beta Chain, Quant, S 59,209 (*)    All other components within normal limits  CBC WITH DIFFERENTIAL/PLATELET - Abnormal; Notable for the following components:   Hemoglobin 10.7 (*)    HCT 33.4 (*)    MCV 77.5 (*)    MCH 24.8 (*)    All other components within normal limits  COMPREHENSIVE METABOLIC PANEL - Abnormal; Notable for the following components:   Potassium 3.2 (*)    CO2 21 (*)    BUN <5 (*)    Calcium 8.8 (*)    Albumin 2.6 (*)    Alkaline Phosphatase 131 (*)    All other components within normal limits  TSH - Abnormal; Notable for the following components:   TSH 0.034 (*)    All other components within normal limits  T4, FREE  T3  TROPONIN I (HIGH SENSITIVITY)  TROPONIN I (HIGH SENSITIVITY)  Radiology No results found.  Pertinent labs & imaging results that were available during my care of the patient were reviewed by me and considered in my medical decision making (see MDM for details).  Medications Ordered in ED Medications  methimazole (TAPAZOLE) tablet 5 mg (5 mg Oral Given 02/26/22 2245)                                                                                                                                      Procedures Procedures  (including critical care time)  Medical Decision Making / ED Course   This patient presents to the ED for concern of fatigue, back pain, chest pain, hypothyroidism, this involves an extensive number of treatment options, and is a complaint that carries with it a high risk of complications and morbidity.  The differential diagnosis includes musculoskeletal back pain associated with third-trimester pregnancy, hypothyroidism, thyroid storm, ACS, PE, pneumonia  MDM: Patient seen emergency room for evaluation of multiple complaints as described above.  Physical exam largely unremarkable outside of a gravid uterus.  [facility] nursing placed the patient on tocometry in the ER and patient has ultimately been cleared from an OB/GYN standpoint laboratory evaluation in the emergency department with mild hypokalemia to 3.2, hemoglobin 10.7 with an MCV of 77.5, TSH is decreased at 0.034 which is less than 1 month ago but significantly higher than 4 months ago.  Her T4 is normal.  T3 unfortunately will not come back tonight and is currently pending.  Given that the patient has no fever, tachycardia or hypertension, I have very low suspicion the patient is currently in thyroid storm.  She does have hyperthyroidism and will need to take her methimazole as prescribed.  I spoke with the OB/GYN called Dr. Elonda Husky and we are both in agreements that the patient is currently not suffering life-threatening emergency or gynecologic emergency and patient is safe for discharge with outpatient OB/GYN follow-up with strict instructions to take her methimazole.  Patient given strict return precautions and discharged.  She was encouraged to increase potassium containing foods.  In regards the patient's chest pain, she is PERC negative and I have overall low suspicion for pulmonary embolism.  She has no cough or fever and I have  low suspicion for pneumonia.  Troponins are normal and ECG is nonischemic.  Heart score is 0.   Additional history obtained:  -External records from outside source obtained and reviewed including: Chart review including previous notes, labs, imaging, consultation notes   Lab Tests: -I ordered, reviewed, and interpreted labs.   The pertinent results include:   Labs Reviewed  HCG, QUANTITATIVE, PREGNANCY - Abnormal; Notable for the following components:      Result Value   hCG, Beta Chain, Quant, S 59,209 (*)    All other components within normal limits  CBC WITH DIFFERENTIAL/PLATELET - Abnormal; Notable for the following components:   Hemoglobin 10.7 (*)  HCT 33.4 (*)    MCV 77.5 (*)    MCH 24.8 (*)    All other components within normal limits  COMPREHENSIVE METABOLIC PANEL - Abnormal; Notable for the following components:   Potassium 3.2 (*)    CO2 21 (*)    BUN <5 (*)    Calcium 8.8 (*)    Albumin 2.6 (*)    Alkaline Phosphatase 131 (*)    All other components within normal limits  TSH - Abnormal; Notable for the following components:   TSH 0.034 (*)    All other components within normal limits  T4, FREE  T3  TROPONIN I (HIGH SENSITIVITY)  TROPONIN I (HIGH SENSITIVITY)      EKG   EKG Interpretation  Date/Time:  Thursday February 26 2022 18:44:01 EDT Ventricular Rate:  94 PR Interval:  156 QRS Duration: 78 QT Interval:  332 QTC Calculation: 415 R Axis:   61 Text Interpretation: Normal sinus rhythm Normal ECG When compared with ECG of 19-Mar-2016 11:54, PREVIOUS ECG IS PRESENT Confirmed by Romelia Bromell ([id number]) on 02/26/2022 6:51:34 PM          Medicines ordered and prescription drug management: Meds ordered this encounter  Medications   methimazole (TAPAZOLE) tablet 5 mg   methimazole (TAPAZOLE) 5 MG tablet    Sig: Take 1 tablet (5 mg total) by mouth 2 (two) times daily.    Dispense:  60 tablet    Refill:  0    -I have reviewed the patients home  medicines and have made adjustments as needed  Critical interventions none  Consultations Obtained: I requested consultation with the OB/GYN on-call,  and discussed lab and imaging findings as well as pertinent plan - they recommend: Outpatient follow-up   Cardiac Monitoring: The patient was maintained on a cardiac monitor.  I personally viewed and interpreted the cardiac monitored which showed an underlying rhythm of: NSR  Social Determinants of Health:  Factors impacting patients care include: none   Reevaluation: After the interventions noted above, I reevaluated the patient and found that they have :improved  Co morbidities that complicate the patient evaluation  Past Medical History:  Diagnosis Date   Anxiety    Asthma    History of preterm delivery    Hyperthyroidism    Post partum depression    Vaginal Pap smear, abnormal    Yeast infection of the vagina       Dispostion: I considered admission for this patient, but she currently does not meet inpatient criteria for admission and safe for discharge outpatient follow-up     Final Clinical Impression(s) / ED Diagnoses Final diagnoses:  Hyperthyroidism     _0 @    Teressa Lower, MD 02/26/22 [phone]

## 2022-02-26 NOTE — ED Triage Notes (Signed)
Pt here via [facility] from home. Pt has graves disease, pt is [redacted] weeks pregnant. Hx of thyroid storm and cardiac arrest w/ previous pregnancy. Pt had labs done Friday and states her thyroid function is high. Pt reports chest tightness and throat pain and contractions when walking. VSS, 18g LAC.

## 2022-02-26 NOTE — Progress Notes (Addendum)
Pt presents to the ED tonight as G8P6 at 29.2 days pregnant.  She complains of tightness in her chest that began last night.  She also says she has been having occasional contractions that began approximately Monday night.  She states that baby hasn't moved a lot today, but fetus begins to move as EFM is applied.  Pt reports no vag bleeding but says she has had some clear thick vaginal discharge since last night.  Pt has a hx of Graves Disease and thyroid storm which led to cardiac arrest with last pregnancy.  She has not begun to take her medication for this as of yet.  Pt also has history of preterm deliveries. Pt receives [facility] with Chiropractor.

## 2022-02-26 NOTE — Progress Notes (Signed)
Dr Posey Rea discussed labs and EFM tracing with Dr Despina Hidden.  Patient to be discharged home.  EFM removed.

## 2022-02-28 LAB — T3: T3, Total: 114 ng/dL (ref 71–180)

## 2022-03-02 ENCOUNTER — Other Ambulatory Visit: Payer: Self-pay | Admitting: Obstetrics and Gynecology

## 2022-03-05 ENCOUNTER — Other Ambulatory Visit (HOSPITAL_COMMUNITY): Payer: Self-pay

## 2022-03-05 MED ORDER — TERCONAZOLE 0.4 % VA CREA
1.0000 | TOPICAL_CREAM | Freq: Every day | VAGINAL | 0 refills | Status: DC
  Filled 2022-03-05: qty 45, 30d supply, fill #0

## 2022-03-10 ENCOUNTER — Other Ambulatory Visit (HOSPITAL_COMMUNITY): Payer: Self-pay

## 2022-03-19 ENCOUNTER — Ambulatory Visit: Payer: Medicaid Other

## 2022-03-20 ENCOUNTER — Ambulatory Visit: Payer: Medicaid Other

## 2022-03-23 ENCOUNTER — Encounter: Payer: Medicaid Other | Admitting: Advanced Practice Midwife

## 2022-03-27 ENCOUNTER — Ambulatory Visit: Payer: Medicaid Other | Admitting: *Deleted

## 2022-03-27 ENCOUNTER — Ambulatory Visit: Payer: Medicaid Other | Attending: Maternal & Fetal Medicine

## 2022-03-27 VITALS — BP 107/43 | HR 72

## 2022-03-27 DIAGNOSIS — O0943 Supervision of pregnancy with grand multiparity, third trimester: Secondary | ICD-10-CM | POA: Diagnosis present

## 2022-03-27 DIAGNOSIS — Z3A33 33 weeks gestation of pregnancy: Secondary | ICD-10-CM

## 2022-03-27 DIAGNOSIS — O99513 Diseases of the respiratory system complicating pregnancy, third trimester: Secondary | ICD-10-CM

## 2022-03-27 DIAGNOSIS — O09893 Supervision of other high risk pregnancies, third trimester: Secondary | ICD-10-CM

## 2022-03-27 DIAGNOSIS — O0933 Supervision of pregnancy with insufficient antenatal care, third trimester: Secondary | ICD-10-CM

## 2022-03-27 DIAGNOSIS — O99343 Other mental disorders complicating pregnancy, third trimester: Secondary | ICD-10-CM

## 2022-03-27 DIAGNOSIS — O0990 Supervision of high risk pregnancy, unspecified, unspecified trimester: Secondary | ICD-10-CM

## 2022-03-27 DIAGNOSIS — O99283 Endocrine, nutritional and metabolic diseases complicating pregnancy, third trimester: Secondary | ICD-10-CM

## 2022-03-27 DIAGNOSIS — O09213 Supervision of pregnancy with history of pre-term labor, third trimester: Secondary | ICD-10-CM

## 2022-03-27 DIAGNOSIS — O09899 Supervision of other high risk pregnancies, unspecified trimester: Secondary | ICD-10-CM

## 2022-03-27 DIAGNOSIS — F419 Anxiety disorder, unspecified: Secondary | ICD-10-CM

## 2022-03-27 DIAGNOSIS — Z8639 Personal history of other endocrine, nutritional and metabolic disease: Secondary | ICD-10-CM | POA: Diagnosis present

## 2022-03-27 DIAGNOSIS — E0590 Thyrotoxicosis, unspecified without thyrotoxic crisis or storm: Secondary | ICD-10-CM | POA: Diagnosis not present

## 2022-03-27 DIAGNOSIS — J45909 Unspecified asthma, uncomplicated: Secondary | ICD-10-CM

## 2022-03-30 ENCOUNTER — Ambulatory Visit: Payer: Medicaid Other | Admitting: Internal Medicine

## 2022-03-30 ENCOUNTER — Other Ambulatory Visit: Payer: Self-pay | Admitting: *Deleted

## 2022-03-30 DIAGNOSIS — E0590 Thyrotoxicosis, unspecified without thyrotoxic crisis or storm: Secondary | ICD-10-CM

## 2022-03-30 DIAGNOSIS — O0943 Supervision of pregnancy with grand multiparity, third trimester: Secondary | ICD-10-CM

## 2022-03-30 DIAGNOSIS — O09893 Supervision of other high risk pregnancies, third trimester: Secondary | ICD-10-CM

## 2022-03-30 NOTE — Progress Notes (Deleted)
Name: Judy [name]  MRN/ DOB: [id number], 1996/02/26    Age/ Sex: 26 y.o., female    PCP: [facility]   Reason for Endocrinology Evaluation: Hyperthyroidism     Date of Initial Endocrinology Evaluation: 03/30/2022     HPI: Judy [name] is a 26 y.o. female with a past medical history of ***. The patient presented for initial endocrinology clinic visit on 03/30/2022 for consultative assistance with her Hyperthyroidism.   Patient has been diagnosed with hyperthyroidism in May 2023 with a suppressed TSH of 0.005 u IU/mL with elevated total T4 at 23.2 UG/DL  Of note she is status post delivery in 02/2021  She is currently at 33.6 weeks of gestation  HISTORY:  Past Medical History:  Past Medical History:  Diagnosis Date   Anxiety    Asthma    History of preterm delivery    Hyperthyroidism    Post partum depression    Vaginal Pap smear, abnormal    Yeast infection of the vagina    Past Surgical History:  Past Surgical History:  Procedure Laterality Date   NO PAST SURGERIES      Social History:  reports that she has never smoked. She has never used smokeless tobacco. She reports that she does not currently use alcohol. She reports that she does not currently use drugs. Family History: family history includes Anxiety disorder in her mother; Asthma in her father and mother; Brain cancer in her mother; Breast cancer in her mother; Diabetes in her mother; Heart disease in her paternal grandfather and paternal grandmother; Hypertension in her maternal grandfather, mother, and paternal aunt; Stomach cancer in her mother.   HOME MEDICATIONS: Allergies as of 03/30/2022       Reactions   Penicillins Hives   Latex Dermatitis   Tape Rash        Medication List        Accurate as of March 30, 2022  7:31 AM. If you have any questions, ask your nurse or doctor.          acetaminophen 500 MG tablet Commonly known as: TYLENOL Take 500-1,000 mg by mouth  as needed for mild pain or moderate pain.   Blood Pressure Kit Devi 1 kit by Does not apply route once a week.   methimazole 5 MG tablet Commonly known as: TAPAZOLE Take 1 tablet (5 mg total) by mouth 2 (two) times daily.          REVIEW OF SYSTEMS: A comprehensive ROS was conducted with the patient and is negative except as per HPI and below:  ROS     OBJECTIVE:  VS: LMP 08/18/2021 (Exact Date)    Wt Readings from Last 3 Encounters:  01/26/22 134 lb (60.8 kg)  10/24/21 117 lb 9.6 oz (53.3 kg)  11/22/19 137 lb (62.1 kg)     EXAM: General: Pt appears well and is in NAD  Eyes: External eye exam normal without stare, lid lag or exophthalmos.  EOM intact.  PERRL.  Neck: General: Supple without adenopathy. Thyroid: Thyroid size normal.  No goiter or nodules appreciated. No thyroid bruit.  Lungs: Clear with good BS bilat with no rales, rhonchi, or wheezes  Heart: Auscultation: RRR.  Abdomen: Normoactive bowel sounds, soft, nontender, without masses or organomegaly palpable  Extremities:  BL LE: No pretibial edema normal ROM and strength.  Mental Status: Judgment, insight: Intact Orientation: Oriented to time, place, and person Mood and affect: No depression, anxiety, or agitation  DATA REVIEWED: ***    ASSESSMENT/PLAN/RECOMMENDATIONS:   ***    Medications :  Signed electronically by: Mack Guise, MD  [facility]  [address] [place], [place] [address] Phone: [phone] FAX: [phone]   CC: [address] [place] [address] Phone: [phone] Fax: [phone]   Return to Endocrinology clinic as below: Future Appointments  Date Time Provider [place]  03/30/2022 11:30 AM Autumne Kallio, Melanie Crazier, MD [facility] None  04/03/2022  3:30 PM [facility] NURSE [facility]  04/03/2022  3:30 PM [facility] US3 [facility]  04/06/2022  1:30 PM Constant,  Peggy, MD [place] None  04/10/2022  2:45 PM [facility] NURSE [facility]  04/10/2022  3:00 PM [facility] US1 [facility]  04/17/2022  1:45 PM [facility] NURSE [facility]  04/17/2022  2:00 PM [facility] US1 [facility]  04/24/2022  2:45 PM [facility] NURSE [facility]  04/24/2022  3:00 PM [facility] US1 [facility]  05/01/2022  3:30 PM [facility] LAB [facility]  05/01/2022  3:45 PM [facility] US4 [facility]

## 2022-04-03 ENCOUNTER — Ambulatory Visit: Payer: Medicaid Other | Admitting: *Deleted

## 2022-04-03 ENCOUNTER — Other Ambulatory Visit: Payer: Self-pay | Admitting: Maternal & Fetal Medicine

## 2022-04-03 ENCOUNTER — Ambulatory Visit: Payer: Medicaid Other | Attending: Maternal & Fetal Medicine

## 2022-04-03 VITALS — BP 111/73 | HR 96

## 2022-04-03 DIAGNOSIS — F419 Anxiety disorder, unspecified: Secondary | ICD-10-CM

## 2022-04-03 DIAGNOSIS — O09899 Supervision of other high risk pregnancies, unspecified trimester: Secondary | ICD-10-CM

## 2022-04-03 DIAGNOSIS — E0590 Thyrotoxicosis, unspecified without thyrotoxic crisis or storm: Secondary | ICD-10-CM

## 2022-04-03 DIAGNOSIS — O0990 Supervision of high risk pregnancy, unspecified, unspecified trimester: Secondary | ICD-10-CM | POA: Insufficient documentation

## 2022-04-03 DIAGNOSIS — O0943 Supervision of pregnancy with grand multiparity, third trimester: Secondary | ICD-10-CM | POA: Diagnosis present

## 2022-04-03 DIAGNOSIS — O99283 Endocrine, nutritional and metabolic diseases complicating pregnancy, third trimester: Secondary | ICD-10-CM | POA: Diagnosis not present

## 2022-04-03 DIAGNOSIS — O09893 Supervision of other high risk pregnancies, third trimester: Secondary | ICD-10-CM

## 2022-04-03 DIAGNOSIS — J45909 Unspecified asthma, uncomplicated: Secondary | ICD-10-CM | POA: Diagnosis not present

## 2022-04-03 DIAGNOSIS — O99513 Diseases of the respiratory system complicating pregnancy, third trimester: Secondary | ICD-10-CM

## 2022-04-03 DIAGNOSIS — O09213 Supervision of pregnancy with history of pre-term labor, third trimester: Secondary | ICD-10-CM

## 2022-04-03 DIAGNOSIS — Z8639 Personal history of other endocrine, nutritional and metabolic disease: Secondary | ICD-10-CM | POA: Diagnosis present

## 2022-04-03 DIAGNOSIS — O0933 Supervision of pregnancy with insufficient antenatal care, third trimester: Secondary | ICD-10-CM

## 2022-04-03 DIAGNOSIS — O99343 Other mental disorders complicating pregnancy, third trimester: Secondary | ICD-10-CM

## 2022-04-03 DIAGNOSIS — Z3A34 34 weeks gestation of pregnancy: Secondary | ICD-10-CM

## 2022-04-03 NOTE — Procedures (Signed)
Judy ERTL 1995-08-17 [redacted]w[redacted]d  Fetus A Non-Stress Test Interpretation for 04/03/22  Indication:  unsatisfactory BPP  Fetal Heart Rate A Mode: External Baseline Rate (A): 130 bpm Variability: Moderate Accelerations: 15 x 15 Decelerations: None Multiple birth?: No  Uterine Activity Mode: Toco Contraction Frequency (min): occas Contraction Duration (sec): 70-110 Contraction Quality: Mild Resting Tone Palpated: Relaxed Resting Time: Adequate  Interpretation (Fetal Testing) Nonstress Test Interpretation: Reactive Overall Impression: Reassuring for gestational age Comments: tracing reviewed by Dr. Annamaria Boots

## 2022-04-06 ENCOUNTER — Encounter: Payer: Medicaid Other | Admitting: Obstetrics and Gynecology

## 2022-04-06 ENCOUNTER — Telehealth: Payer: Self-pay

## 2022-04-06 ENCOUNTER — Telehealth: Payer: Self-pay | Admitting: Obstetrics and Gynecology

## 2022-04-08 ENCOUNTER — Encounter: Payer: Self-pay | Admitting: Obstetrics and Gynecology

## 2022-04-08 ENCOUNTER — Ambulatory Visit (INDEPENDENT_AMBULATORY_CARE_PROVIDER_SITE_OTHER): Payer: Medicaid Other | Admitting: Obstetrics and Gynecology

## 2022-04-08 VITALS — BP 109/69 | HR 93 | Wt 146.0 lb

## 2022-04-08 DIAGNOSIS — O09899 Supervision of other high risk pregnancies, unspecified trimester: Secondary | ICD-10-CM

## 2022-04-08 DIAGNOSIS — O0990 Supervision of high risk pregnancy, unspecified, unspecified trimester: Secondary | ICD-10-CM

## 2022-04-08 DIAGNOSIS — O99282 Endocrine, nutritional and metabolic diseases complicating pregnancy, second trimester: Secondary | ICD-10-CM

## 2022-04-08 DIAGNOSIS — O0993 Supervision of high risk pregnancy, unspecified, third trimester: Secondary | ICD-10-CM

## 2022-04-08 DIAGNOSIS — E0590 Thyrotoxicosis, unspecified without thyrotoxic crisis or storm: Secondary | ICD-10-CM | POA: Diagnosis not present

## 2022-04-08 DIAGNOSIS — Z3A35 35 weeks gestation of pregnancy: Secondary | ICD-10-CM

## 2022-04-08 DIAGNOSIS — O99283 Endocrine, nutritional and metabolic diseases complicating pregnancy, third trimester: Secondary | ICD-10-CM | POA: Diagnosis not present

## 2022-04-08 DIAGNOSIS — O09893 Supervision of other high risk pregnancies, third trimester: Secondary | ICD-10-CM

## 2022-04-08 MED ORDER — METHIMAZOLE 5 MG PO TABS: 5.0000 mg | ORAL_TABLET | Freq: Two times a day (BID) | ORAL | 2 refills | Status: AC

## 2022-04-08 MED ORDER — OMEPRAZOLE 20 MG PO CPDR: 20.0000 mg | DELAYED_RELEASE_CAPSULE | Freq: Every day | ORAL | 2 refills | Status: DC

## 2022-04-08 NOTE — Patient Instructions (Signed)

## 2022-04-08 NOTE — Progress Notes (Signed)
Pt states she has been having ctx, more in her back - pt would like to be checked due to having preterm deliveries.   Pt also needs refill on thyroid Rx and Heartburn Rx.

## 2022-04-08 NOTE — Progress Notes (Signed)
Subjective:  Judy [name] is a 26 y.o. [phone] at [redacted]w[redacted]d being seen today for ongoing prenatal care.  She is currently monitored for the following issues for this high-risk pregnancy and has Depressive disorder, not elsewhere classified; Abnormal Pap smear of cervix; History of preterm delivery, currently pregnant; Supervision of high risk pregnancy, antepartum; and Hyperthyroidism affecting pregnancy on their problem list.  Patient reports occasional contractions and general discomforts of pregnancy.  Contractions: Irregular. Vag. Bleeding: None.  Movement: Present. Denies leaking of fluid.   The following portions of the patient's history were reviewed and updated as appropriate: allergies, current medications, past family history, past medical history, past social history, past surgical history and problem list. Problem list updated.  Objective:   Vitals:   04/08/22 1535  BP: 109/69  Pulse: 93  Weight: 146 lb (66.2 kg)    Fetal Status: Fetal Heart Rate (bpm): 145   Movement: Present     General:  Alert, oriented and cooperative. Patient is in no acute distress.  Skin: Skin is warm and dry. No rash noted.   Cardiovascular: Normal heart rate noted  Respiratory: Normal respiratory effort, no problems with respiration noted  Abdomen: Soft, gravid, appropriate for gestational age. Pain/Pressure: Absent     Pelvic:  Cervical exam performed        Extremities: Normal range of motion.     Mental Status: Normal mood and affect. Normal behavior. Normal judgment and thought content.   Urinalysis:      Assessment and Plan:  Pregnancy: [id number] at [redacted]w[redacted]d  1. Hyperthyroidism affecting pregnancy in second trimester Serial growth scans and antenatal testing as per MFM Pt not compliant with Tapazone. Importance of taking reivewed - methimazole (TAPAZOLE) 5 MG tablet; Take 1 tablet (5 mg total) by mouth 2 (two) times daily.  Dispense: 60 tablet; Refill: 2  2. Supervision of high risk  pregnancy, antepartum Stable GBS next visit - omeprazole (PRILOSEC) 20 MG capsule; Take 1 capsule (20 mg total) by mouth daily.  Dispense: 30 capsule; Refill: 2  3. History of preterm delivery, currently pregnant Stable  Preterm labor symptoms and general obstetric precautions including but not limited to vaginal bleeding, contractions, leaking of fluid and fetal movement were reviewed in detail with the patient. Please refer to After Visit Summary for other counseling recommendations.  Return in about 1 week (around 04/15/2022) for OB visit, face to face, MD only.   Chancy Milroy, MD

## 2022-04-10 ENCOUNTER — Encounter: Payer: Self-pay | Admitting: Obstetrics and Gynecology

## 2022-04-10 ENCOUNTER — Other Ambulatory Visit: Payer: Medicaid Other

## 2022-04-10 ENCOUNTER — Ambulatory Visit: Payer: Medicaid Other

## 2022-04-15 ENCOUNTER — Encounter: Payer: Medicaid Other | Admitting: Obstetrics and Gynecology

## 2022-04-17 ENCOUNTER — Ambulatory Visit: Payer: Medicaid Other

## 2022-04-17 ENCOUNTER — Ambulatory Visit: Payer: Medicaid Other | Attending: Obstetrics and Gynecology

## 2022-04-24 ENCOUNTER — Ambulatory Visit: Payer: Medicaid Other

## 2022-04-24 ENCOUNTER — Encounter (HOSPITAL_COMMUNITY): Payer: Self-pay | Admitting: Obstetrics & Gynecology

## 2022-04-24 ENCOUNTER — Inpatient Hospital Stay (HOSPITAL_COMMUNITY)
Admission: AD | Admit: 2022-04-24 | Discharge: 2022-04-25 | DRG: 807 | Disposition: A | Discharge status: Home / Self Care | Payer: Medicaid Other | Attending: Obstetrics & Gynecology | Admitting: Obstetrics & Gynecology

## 2022-04-24 DIAGNOSIS — E0590 Thyrotoxicosis, unspecified without thyrotoxic crisis or storm: Secondary | ICD-10-CM | POA: Diagnosis present

## 2022-04-24 DIAGNOSIS — O26893 Other specified pregnancy related conditions, third trimester: Secondary | ICD-10-CM | POA: Diagnosis present

## 2022-04-24 DIAGNOSIS — O09899 Supervision of other high risk pregnancies, unspecified trimester: Secondary | ICD-10-CM

## 2022-04-24 DIAGNOSIS — Z3A37 37 weeks gestation of pregnancy: Secondary | ICD-10-CM

## 2022-04-24 DIAGNOSIS — Z880 Allergy status to penicillin: Secondary | ICD-10-CM

## 2022-04-24 DIAGNOSIS — O09893 Supervision of other high risk pregnancies, third trimester: Secondary | ICD-10-CM

## 2022-04-24 DIAGNOSIS — O99284 Endocrine, nutritional and metabolic diseases complicating childbirth: Principal | ICD-10-CM | POA: Diagnosis present

## 2022-04-24 DIAGNOSIS — O0990 Supervision of high risk pregnancy, unspecified, unspecified trimester: Secondary | ICD-10-CM

## 2022-04-24 DIAGNOSIS — O0943 Supervision of pregnancy with grand multiparity, third trimester: Secondary | ICD-10-CM

## 2022-04-24 LAB — RPR: RPR Ser Ql: NONREACTIVE

## 2022-04-24 LAB — CBC
HCT: 38.6 % (ref 36.0–46.0)
Hemoglobin: 12.2 g/dL (ref 12.0–15.0)
MCH: 23.8 pg — ABNORMAL LOW (ref 26.0–34.0)
MCHC: 31.6 g/dL (ref 30.0–36.0)
MCV: 75.4 fL — ABNORMAL LOW (ref 80.0–100.0)
Platelets: 110 10*3/uL — ABNORMAL LOW (ref 150–400)
RBC: 5.12 MIL/uL — ABNORMAL HIGH (ref 3.87–5.11)
RDW: 14.3 % (ref 11.5–15.5)
WBC: 9.2 10*3/uL (ref 4.0–10.5)
nRBC: 0 % (ref 0.0–0.2)

## 2022-04-24 LAB — TYPE AND SCREEN
ABO/RH(D): B POS
Antibody Screen: NEGATIVE

## 2022-04-24 MED ORDER — WITCH HAZEL-GLYCERIN EX PADS: 1.0000 | MEDICATED_PAD | CUTANEOUS | Status: DC | PRN

## 2022-04-24 MED ORDER — SOD CITRATE-CITRIC ACID 500-334 MG/5ML PO SOLN
30.0000 mL | ORAL | Status: DC | PRN
  Filled 2022-04-24: qty 30

## 2022-04-24 MED ORDER — OXYTOCIN-SODIUM CHLORIDE 30-0.9 UT/500ML-% IV SOLN
INTRAVENOUS | Status: AC
  Administered 2022-04-24: 333 mL via INTRAVENOUS
  Filled 2022-04-24: qty 500

## 2022-04-24 MED ORDER — ACETAMINOPHEN 325 MG PO TABS: 650.0000 mg | ORAL_TABLET | ORAL | Status: DC | PRN

## 2022-04-24 MED ORDER — LACTATED RINGERS IV SOLN: INTRAVENOUS | Status: DC

## 2022-04-24 MED ORDER — OXYTOCIN-SODIUM CHLORIDE 30-0.9 UT/500ML-% IV SOLN: 2.5000 [IU]/h | INTRAVENOUS | Status: DC

## 2022-04-24 MED ORDER — OXYCODONE-ACETAMINOPHEN 5-325 MG PO TABS
2.0000 | ORAL_TABLET | ORAL | Status: DC | PRN
  Administered 2022-04-24: 2 via ORAL
  Filled 2022-04-24: qty 2

## 2022-04-24 MED ORDER — METHIMAZOLE 5 MG PO TABS
5.0000 mg | ORAL_TABLET | Freq: Two times a day (BID) | ORAL | Status: DC
  Administered 2022-04-24 – 2022-04-25 (×3): 5 mg via ORAL
  Filled 2022-04-24 (×4): qty 1

## 2022-04-24 MED ORDER — PRENATAL MULTIVITAMIN CH
1.0000 | ORAL_TABLET | Freq: Every day | ORAL | Status: DC
  Administered 2022-04-24 – 2022-04-25 (×2): 1 via ORAL
  Filled 2022-04-24 (×2): qty 1

## 2022-04-24 MED ORDER — COCONUT OIL OIL: 1.0000 | TOPICAL_OIL | Status: DC | PRN

## 2022-04-24 MED ORDER — ACETAMINOPHEN 325 MG PO TABS
650.0000 mg | ORAL_TABLET | ORAL | Status: DC | PRN
  Administered 2022-04-24 – 2022-04-25 (×2): 650 mg via ORAL
  Filled 2022-04-24 (×2): qty 2

## 2022-04-24 MED ORDER — OXYCODONE-ACETAMINOPHEN 5-325 MG PO TABS: 1.0000 | ORAL_TABLET | ORAL | Status: DC | PRN

## 2022-04-24 MED ORDER — LACTATED RINGERS IV SOLN: 500.0000 mL | INTRAVENOUS | Status: DC | PRN

## 2022-04-24 MED ORDER — TETANUS-DIPHTH-ACELL PERTUSSIS 5-2.5-18.5 LF-MCG/0.5 IM SUSY: 0.5000 mL | PREFILLED_SYRINGE | Freq: Once | INTRAMUSCULAR | Status: DC

## 2022-04-24 MED ORDER — OXYTOCIN 10 UNIT/ML IJ SOLN
INTRAMUSCULAR | Status: AC
  Filled 2022-04-24: qty 1

## 2022-04-24 MED ORDER — LIDOCAINE HCL (PF) 1 % IJ SOLN: 30.0000 mL | INTRAMUSCULAR | Status: DC | PRN

## 2022-04-24 MED ORDER — ZOLPIDEM TARTRATE 5 MG PO TABS: 5.0000 mg | ORAL_TABLET | Freq: Every evening | ORAL | Status: DC | PRN

## 2022-04-24 MED ORDER — OXYTOCIN BOLUS FROM INFUSION: 333.0000 mL | Freq: Once | INTRAVENOUS | Status: AC

## 2022-04-24 MED ORDER — SENNOSIDES-DOCUSATE SODIUM 8.6-50 MG PO TABS
2.0000 | ORAL_TABLET | ORAL | Status: DC
  Administered 2022-04-24 – 2022-04-25 (×2): 2 via ORAL
  Filled 2022-04-24 (×2): qty 2

## 2022-04-24 MED ORDER — BENZOCAINE-MENTHOL 20-0.5 % EX AERO: 1.0000 | INHALATION_SPRAY | CUTANEOUS | Status: DC | PRN

## 2022-04-24 MED ORDER — ONDANSETRON HCL 4 MG/2ML IJ SOLN: 4.0000 mg | INTRAMUSCULAR | Status: DC | PRN

## 2022-04-24 MED ORDER — IBUPROFEN 600 MG PO TABS
600.0000 mg | ORAL_TABLET | Freq: Four times a day (QID) | ORAL | Status: DC
  Administered 2022-04-24 – 2022-04-25 (×5): 600 mg via ORAL
  Filled 2022-04-24 (×6): qty 1

## 2022-04-24 MED ORDER — SIMETHICONE 80 MG PO CHEW: 80.0000 mg | CHEWABLE_TABLET | ORAL | Status: DC | PRN

## 2022-04-24 MED ORDER — ONDANSETRON HCL 4 MG/2ML IJ SOLN: 4.0000 mg | Freq: Four times a day (QID) | INTRAMUSCULAR | Status: DC | PRN

## 2022-04-24 MED ORDER — DIPHENHYDRAMINE HCL 25 MG PO CAPS: 25.0000 mg | ORAL_CAPSULE | Freq: Four times a day (QID) | ORAL | Status: DC | PRN

## 2022-04-24 MED ORDER — ONDANSETRON HCL 4 MG PO TABS: 4.0000 mg | ORAL_TABLET | ORAL | Status: DC | PRN

## 2022-04-24 MED ORDER — DIBUCAINE (PERIANAL) 1 % EX OINT: 1.0000 | TOPICAL_OINTMENT | CUTANEOUS | Status: DC | PRN

## 2022-04-24 NOTE — MAU Note (Signed)
Pt arrived via EMS - for labor check.  Says strong UC's started at 2330 Unsure if SROM  Denies HSV GBS- says not collected

## 2022-04-24 NOTE — H&P (Cosign Needed Addendum)
LABOR AND DELIVERY ADMISSION HISTORY AND PHYSICAL NOTE  Judy [name] is a 26 y.o. female [phone] with IUP at 44w3dby first trimester ultrasound presenting for SOL. Did not want to deliver at home as with previous babies because she didn't want to wake anyone at the house up. She reports positive fetal movement. She denies leakage of fluid or vaginal bleeding.  Prenatal History/Complications: [facility] [place]:  - Hyperthyroidism on methimazole  Past Medical History: Past Medical History:  Diagnosis Date   Anxiety    Asthma    History of preterm delivery    Hyperthyroidism    Post partum depression    Vaginal Pap smear, abnormal    Yeast infection of the vagina     Past Surgical History: Past Surgical History:  Procedure Laterality Date   NO PAST SURGERIES      Obstetrical History: OB History     Gravida  8   Para  6   Term  2   Preterm  4   AB  1   Living  6      SAB  1   IAB  0   Ectopic  0   Multiple  0   Live Births  6           Social History: Social History   Socioeconomic History   Marital status: Single    Spouse name: Not on file   Number of children: 3   Years of education: 12   Highest education level: Some college, no degree  Occupational History   Occupation: Unemployed  Tobacco Use   Smoking status: Never   Smokeless tobacco: Never  Vaping Use   Vaping Use: Never used  Substance and Sexual Activity   Alcohol use: Not Currently   Drug use: Not Currently   Sexual activity: Yes    Partners: Male    Birth control/protection: None  Other Topics Concern   Not on file  Social History Narrative   Not on file   Social Determinants of Health   Financial Resource Strain: Low Risk  (07/07/2018)   Overall Financial Resource Strain (CARDIA)    Difficulty of Paying Living Expenses: Not hard at all  Food Insecurity: No Food Insecurity (04/24/2022)   Hunger Vital Sign    Worried About Running Out of  Food in the Last Year: Never true    Ran Out of Food in the Last Year: Never true  Transportation Needs: No Transportation Needs (04/24/2022)   PRAPARE - THydrologist(Medical): No    Lack of Transportation (Non-Medical): No  Physical Activity: Insufficiently Active (07/07/2018)   Exercise Vital Sign    Days of Exercise per Week: 7 days    Minutes of Exercise per Session: 10 min  Stress: No Stress Concern Present (07/07/2018)   FHauser   Feeling of Stress : Only a little  Social Connections: Moderately Integrated (07/07/2018)   Social Connection and Isolation Panel [NHANES]    Frequency of Communication with Friends and Family: More than three times a week    Frequency of Social Gatherings with Friends and Family: More than three times a week    Attends Religious Services: More than 4 times per year    Active Member of [employer] Organizations: No    Attends CArchivistMeetings: Never    Marital Status: Living with partner    Family History:  Family History  Problem Relation Age of Onset   Hypertension Mother    Diabetes Mother    Anxiety disorder Mother    Asthma Mother    Breast cancer Mother    Stomach cancer Mother    Brain cancer Mother    Asthma Father    Hypertension Maternal Grandfather    Heart disease Paternal Grandmother    Heart disease Paternal Grandfather    Hypertension Paternal Aunt     Allergies: Allergies  Allergen Reactions   Penicillins Hives   Latex Dermatitis   Tape Rash    Medications Prior to Admission  Medication Sig Dispense Refill Last Dose   methimazole (TAPAZOLE) 5 MG tablet Take 1 tablet (5 mg total) by mouth 2 (two) times daily. 60 tablet 2 Past Week   acetaminophen (TYLENOL) 500 MG tablet Take 500-1,000 mg by mouth as needed for mild pain or moderate pain.   Unknown   Blood Pressure Monitoring (BLOOD PRESSURE KIT) DEVI 1 kit by Does  not apply route once a week. 1 each 0 Unknown   omeprazole (PRILOSEC) 20 MG capsule Take 1 capsule (20 mg total) by mouth daily. 30 capsule 2 Unknown     Review of Systems  All systems reviewed and negative except as stated in HPI  Physical Exam Blood pressure 124/69, pulse 97, resp. rate 20, last menstrual period 08/18/2021, unknown if currently breastfeeding. General appearance: alert, oriented, NAD Lungs: normal respiratory effort Heart: regular rate Abdomen: soft, non-tender; gravid Extremities: No calf swelling or tenderness Presentation: cephalic Fetal monitoring: 130/moderate/accels, variable decels Uterine activity: contracting regularly Dilation: 10 Effacement (%): 100 Station: 0 Exam by:: Dr. Caron Presume  Prenatal labs: ABO, Rh: B/Positive/-- (05/05 1130) Antibody: Negative (05/05 1130) Rubella: 4.48 (05/05 1130) RPR: Non Reactive (05/05 1130)  HBsAg: Negative (05/05 1130)  HIV: Non Reactive (05/05 1130)  GC/Chlamydia: negative GBS:   Unknown, negative in prior pregnancies 2-hr GTT: Not done Genetic screening:  Declined Anatomy US: nml  Prenatal Transfer Tool  Maternal Diabetes: No, did not complete glucola Genetic Screening: Declined Maternal Ultrasounds/Referrals: Normal Fetal Ultrasounds or other Referrals:  Referred to Materal Fetal Medicine  (Hx of Graves disease and asthma) Maternal Substance Abuse:  No Significant Maternal Medications:  Meds include: Other: methimazole Significant Maternal Lab Results: None  No results found for this or any previous visit (from the past 24 hour(s)).  Patient Active Problem List   Diagnosis Date Noted   Indication for care in labor and delivery, antepartum 04/24/2022   Hyperthyroidism affecting pregnancy 10/27/2021   Supervision of high risk pregnancy, antepartum 10/24/2021   History of preterm delivery, currently pregnant 06/27/2019   Abnormal Pap smear of cervix 05/30/2018   Depressive disorder, not elsewhere  classified 10/03/2012    Assessment: Judy [name] is a 26 y.o. [id number] at 5w3dhere for spontaneous onset of labor at home, brought by EMS  #Labor: GPaullinamultip. Active, 7 cm, contracting regularly #Pain: Declines epidural, too dilated for fentanyl #FWB: Cat 2 #ID:  Did not do GBS yet, but negative for last pregnancies #MOF: Breast #MOC: Declines #Circ:  Girl, N/A  MWilhemina Cash11/08/2021, 3:05 AM  Fellow Attestation  I saw and evaluated the patient, performing the key elements of the service.I  personally performed or re-performed the history, physical exam, and medical decision making activities of this service and have verified that the service and findings are accurately documented in the resident's note. I developed the management plan that is described in the resident's note,  and I agree with the content, with my edits above.    Gifford Shave, MD OB Fellow

## 2022-04-24 NOTE — Discharge Summary (Signed)
Postpartum Discharge Summary  Date of Service updated***     Patient Name: Judy [name] DOB: August 22, 1995 MRN: [id number]  Date of admission: 04/24/2022 Delivery date:04/24/2022  Delivering provider: Concepcion Living  Date of discharge: 04/24/2022  Admitting diagnosis: Indication for care in labor and delivery, antepartum [O75.9] Intrauterine pregnancy: [redacted]w[redacted]d    Secondary diagnosis:  Principal Problem:   Indication for care in labor and delivery, antepartum Active Problems:   Vaginal delivery   History of preterm delivery, currently pregnant   Supervision of high risk pregnancy, antepartum   Hyperthyroidism affecting pregnancy  Additional problems: None 0    Discharge diagnosis: Term Pregnancy Delivered and hyperthyroidism                                                Post partum procedures:{Postpartum procedures:23558} Augmentation: AROM Complications: None  Hospital course: Onset of Labor With Vaginal Delivery      26y.o. yo [phone] 369w3das admitted in Active Labor on 04/24/2022. Labor course was uncomplicated.  Membrane Rupture Time/Date: 2:27 AM ,04/24/2022   Delivery Method:Vaginal, Spontaneous  Episiotomy: None  Lacerations:  None  Patient had a postpartum course complicated by ***.  She is ambulating, tolerating a regular diet, passing flatus, and urinating well. Patient is discharged home in stable condition on 04/24/22.  Newborn Data: Birth date:04/24/2022  Birth time:2:51 AM  Gender:Female  Living status:Living  Apgars:8 ,9  Weight:2400 g   Magnesium Sulfate received: {Mag received:30440022} BMZ received: {BMZ received:30440023} Rhophylac:{Rhophylac received:30440032} MMNIO:{EVO:35009381}-DaP:{Tdap:23962} Flu: {F{WEX:93716}ransfusion:{Transfusion received:30440034}  Physical exam  Vitals:   04/24/22 0204  BP: 124/69  Pulse: 97  Resp: 20   General: {Exam; general:21111117} Lochia: {Desc;  appropriate/inappropriate:30686::"appropriate"} Uterine Fundus: {Desc; firm/soft:30687} Incision: {Exam; incision:21111123} DVT Evaluation: {Exam; dvt:2111122} Labs: Lab Results  Component Value Date   WBC 6.5 02/26/2022   HGB 10.7 (L) 02/26/2022   HCT 33.4 (L) 02/26/2022   MCV 77.5 (L) 02/26/2022   PLT 154 02/26/2022      Latest Ref Rng & Units 02/26/2022    6:35 PM  CMP  Glucose 70 - 99 mg/dL 82   BUN 6 - 20 mg/dL <5   Creatinine 0.44 - 1.00 mg/dL 0.52   Sodium 135 - 145 mmol/L 135   Potassium 3.5 - 5.1 mmol/L 3.2   Chloride 98 - 111 mmol/L 106   CO2 22 - 32 mmol/L 21   Calcium 8.9 - 10.3 mg/dL 8.8   Total Protein 6.5 - 8.1 g/dL 6.8   Total Bilirubin 0.3 - 1.2 mg/dL 0.3   Alkaline Phos 38 - 126 U/L 131   AST 15 - 41 U/L 18   ALT 0 - 44 U/L 8    Edinburgh Score:    01/05/2019    8:18 AM  Edinburgh Postnatal Depression Scale Screening Tool  I have been able to laugh and see the funny side of things. 0  I have looked forward with enjoyment to things. 0  I have blamed myself unnecessarily when things went wrong. 2  I have been anxious or worried for no good reason. 2  I have felt scared or panicky for no good reason. 1  Things have been getting on top of me. 1  I have been so unhappy that I have had difficulty sleeping. 0  I have felt sad or  miserable. 0  I have been so unhappy that I have been crying. 1  The thought of harming myself has occurred to me. 0  Edinburgh Postnatal Depression Scale Total 7     After visit meds:  Allergies as of 04/24/2022       Reactions   Penicillins Hives   Latex Dermatitis   Tape Rash     Med Rec must be completed prior to using this [facility]***        Discharge home in stable condition Infant Feeding: Breast Infant Disposition:home with mother Discharge instruction: per After Visit Summary and Postpartum booklet. Activity: Advance as tolerated. Pelvic rest for 6 weeks.  Diet: routine diet Future Appointments: Future  Appointments  Date Time Provider [place]  04/24/2022  2:45 PM [facility] NURSE [facility]  04/24/2022  3:00 PM [facility] US1 [facility]  05/01/2022  3:30 PM [facility] NURSE [facility]  05/01/2022  3:45 PM [facility] US4 [facility]  08/06/2022 11:10 AM Shamleffer, Melanie Crazier, MD [facility] None   Follow up Visit:  Message sent 04/24/22  Please schedule this patient for a In person postpartum visit in 4 weeks with the following provider: Any provider. Additional Postpartum F/U: None   High risk pregnancy complicated by:  hyperthyroidism affecting pregnancy  Delivery mode:  Vaginal, Spontaneous  Anticipated Birth Control:  Unsure   04/24/2022 Concepcion Living, MD

## 2022-04-24 NOTE — H&P (Deleted)
Patient: Judy [name] MRN: [id number]  GBS status: unknown, IAP not given due to penicillin allergy and previous multiple pregnancies where she was GBS negative  Patient is a 26 y.o. now G8P7 s/p NSVD at [redacted]w[redacted]d, who was admitted for [name]. AROM 0h 81m prior to delivery with clear fluid.    Delivery Note At 2:51 AM a viable female was delivered via Vaginal, Spontaneous (Presentation: Middle Occiput Anterior).  APGAR: 8, 9; weight pending.   Placenta status: Spontaneous, Intact.  Cord: 3 vessels with the following complications: None.   Anesthesia: None Episiotomy:  None Lacerations:  None Suture Repair:  n/a Est. Blood Loss (mL):  36  Mom to postpartum.  Baby to Couplet care / Skin to Skin.  Lex Linhares 04/24/2022, 3:17 AM     Head delivered [place]. No nuchal cord present. Shoulder and body delivered in usual fashion. Infant with spontaneous cry, placed on mother's abdomen, dried and bulb suctioned. Cord clamped x 2 after 5-minute delay per mother's request, and cut by mother. Cord blood drawn. Placenta delivered spontaneously with gentle cord traction. Fundus firm with massage and Pitocin. Perineum inspected and found to have no lacerations that required repair or were not hemostatic on their own.

## 2022-04-24 NOTE — Lactation Note (Signed)
This note was copied from a baby's chart. Lactation Consultation Note  Patient Name: Judy [name] Date: 04/24/2022   Age:26 hours Mom chooses to formula feed. Maternal Data    Feeding    LATCH Score                    Lactation Tools Discussed/Used    Interventions    Discharge    Consult Status Consult Status: Complete    Jase Himmelberger G 04/24/2022, 3:40 AM

## 2022-04-24 NOTE — Progress Notes (Signed)
EFM removed. FHR 135. [facility], RN and Dr. Harolyn Rutherford transported pt via stretcher to L&D.

## 2022-04-25 MED ORDER — ACETAMINOPHEN 325 MG PO TABS: 650.0000 mg | ORAL_TABLET | ORAL | 0 refills | Status: AC | PRN

## 2022-04-25 MED ORDER — WITCH HAZEL-GLYCERIN EX PADS: 1.0000 | MEDICATED_PAD | CUTANEOUS | 12 refills | Status: DC | PRN

## 2022-04-25 MED ORDER — SENNOSIDES-DOCUSATE SODIUM 8.6-50 MG PO TABS: 2.0000 | ORAL_TABLET | ORAL | 0 refills | Status: DC

## 2022-04-25 MED ORDER — IBUPROFEN 600 MG PO TABS: 600.0000 mg | ORAL_TABLET | Freq: Four times a day (QID) | ORAL | 0 refills | Status: DC

## 2022-04-25 MED ORDER — BENZOCAINE-MENTHOL 20-0.5 % EX AERO: 1.0000 | INHALATION_SPRAY | CUTANEOUS | 0 refills | Status: DC | PRN
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
## 2022-04-25 NOTE — Progress Notes (Signed)
CSW received call from RN requesting assistance with taxi transport for [name] and infant. CSW assisted [name] with taxi voucher.  Signed,  Torie Towle K. Haeley Fordham, MSW, LCSWA, LCASA 04/25/2022 5:51 PM   

## 2022-04-27 ENCOUNTER — Telehealth (HOSPITAL_COMMUNITY): Payer: Self-pay | Admitting: *Deleted

## 2022-04-27 DIAGNOSIS — Z1331 Encounter for screening for depression: Secondary | ICD-10-CM

## 2022-04-27 NOTE — Telephone Encounter (Signed)
Inpatient EPDS=11. CSW consulted completed at that time. Ambulatory IBH referral made today and Dr. Dione Plover notified via chart.  Odis Hollingshead, RN 04-27-2022 at 11:03am

## 2022-04-28 NOTE — Telephone Encounter (Signed)
PT INQUIRED ABOUT APPT AND NEEDED TO RESCHEDULE DUE TO NOT HAVING A SITTER. INFORMED PT SHOULD COULD BRING CHILDREN TO RECEIVE OB CARE AS SHES MISSED SEVERAL APPTS.

## 2022-04-28 NOTE — Telephone Encounter (Signed)
PT unreachable with number on file to reschedule NEW OB APPT  Outgoing call

## 2022-05-01 ENCOUNTER — Other Ambulatory Visit: Payer: Medicaid Other

## 2022-05-01 ENCOUNTER — Ambulatory Visit: Payer: Medicaid Other

## 2022-05-06 ENCOUNTER — Telehealth (HOSPITAL_COMMUNITY): Payer: Self-pay | Admitting: *Deleted

## 2022-05-06 NOTE — Telephone Encounter (Signed)
Mom reports feeling good. No concerns about herself at this time. EPDS=12 [facility] score=11) Amb IBH referral made on 04-27-2022, but pt reports she has an appt with her therapist on Friday this week.  Mom reports baby is doing well. Feeding, peeing, and pooping without difficulty. Safe sleep reviewed. Mom reports no concerns about baby at present.  Duffy Rhody, RN 05-06-2022 at 2:24pm

## 2022-05-22 ENCOUNTER — Ambulatory Visit: Payer: Medicaid Other | Admitting: Student

## 2022-05-31 ENCOUNTER — Other Ambulatory Visit: Payer: Self-pay

## 2022-05-31 ENCOUNTER — Encounter (HOSPITAL_COMMUNITY): Payer: Self-pay

## 2022-05-31 ENCOUNTER — Inpatient Hospital Stay (HOSPITAL_COMMUNITY)
Admission: EM | Admit: 2022-05-31 | Discharge: 2022-06-02 | DRG: 419 | Disposition: A | Discharge status: Home / Self Care | Payer: Medicaid Other | Attending: Surgery | Admitting: Surgery

## 2022-05-31 ENCOUNTER — Emergency Department (HOSPITAL_COMMUNITY): Payer: Medicaid Other

## 2022-05-31 DIAGNOSIS — Z1152 Encounter for screening for COVID-19: Secondary | ICD-10-CM

## 2022-05-31 DIAGNOSIS — K5900 Constipation, unspecified: Secondary | ICD-10-CM | POA: Diagnosis not present

## 2022-05-31 DIAGNOSIS — E0590 Thyrotoxicosis, unspecified without thyrotoxic crisis or storm: Secondary | ICD-10-CM | POA: Diagnosis present

## 2022-05-31 DIAGNOSIS — Z91040 Latex allergy status: Secondary | ICD-10-CM

## 2022-05-31 DIAGNOSIS — Z9049 Acquired absence of other specified parts of digestive tract: Secondary | ICD-10-CM

## 2022-05-31 DIAGNOSIS — K819 Cholecystitis, unspecified: Secondary | ICD-10-CM | POA: Diagnosis present

## 2022-05-31 DIAGNOSIS — K8000 Calculus of gallbladder with acute cholecystitis without obstruction: Secondary | ICD-10-CM | POA: Diagnosis present

## 2022-05-31 DIAGNOSIS — Z79899 Other long term (current) drug therapy: Secondary | ICD-10-CM

## 2022-05-31 DIAGNOSIS — Z818 Family history of other mental and behavioral disorders: Secondary | ICD-10-CM

## 2022-05-31 DIAGNOSIS — K810 Acute cholecystitis: Principal | ICD-10-CM | POA: Diagnosis present

## 2022-05-31 DIAGNOSIS — F419 Anxiety disorder, unspecified: Secondary | ICD-10-CM | POA: Diagnosis present

## 2022-05-31 DIAGNOSIS — Z880 Allergy status to penicillin: Secondary | ICD-10-CM

## 2022-05-31 DIAGNOSIS — Z91048 Other nonmedicinal substance allergy status: Secondary | ICD-10-CM

## 2022-05-31 DIAGNOSIS — E876 Hypokalemia: Secondary | ICD-10-CM | POA: Diagnosis present

## 2022-05-31 LAB — CBC
HCT: 42.4 % (ref 36.0–46.0)
Hemoglobin: 13.5 g/dL (ref 12.0–15.0)
MCH: 24.5 pg — ABNORMAL LOW (ref 26.0–34.0)
MCHC: 31.8 g/dL (ref 30.0–36.0)
MCV: 76.8 fL — ABNORMAL LOW (ref 80.0–100.0)
Platelets: 295 10*3/uL (ref 150–400)
RBC: 5.52 MIL/uL — ABNORMAL HIGH (ref 3.87–5.11)
RDW: 15.3 % (ref 11.5–15.5)
WBC: 17.4 10*3/uL — ABNORMAL HIGH (ref 4.0–10.5)
nRBC: 0 % (ref 0.0–0.2)

## 2022-05-31 LAB — COMPREHENSIVE METABOLIC PANEL
ALT: 355 U/L — ABNORMAL HIGH (ref 0–44)
AST: 638 U/L — ABNORMAL HIGH (ref 15–41)
Albumin: 4.4 g/dL (ref 3.5–5.0)
Alkaline Phosphatase: 94 U/L (ref 38–126)
Anion gap: 8 (ref 5–15)
BUN: 9 mg/dL (ref 6–20)
CO2: 25 mmol/L (ref 22–32)
Calcium: 9.4 mg/dL (ref 8.9–10.3)
Chloride: 108 mmol/L (ref 98–111)
Creatinine, Ser: 0.8 mg/dL (ref 0.44–1.00)
GFR, Estimated: >60 mL/min (ref >60)
Glucose, Bld: 122 mg/dL — ABNORMAL HIGH (ref 70–99)
Potassium: 3.3 mmol/L — ABNORMAL LOW (ref 3.5–5.1)
Sodium: 141 mmol/L (ref 135–145)
Total Bilirubin: 2.8 mg/dL — ABNORMAL HIGH (ref 0.3–1.2)
Total Protein: 8.1 g/dL (ref 6.5–8.1)

## 2022-05-31 LAB — LIPASE, BLOOD: Lipase: 45 U/L (ref 11–51)

## 2022-05-31 LAB — RESP PANEL BY RT-PCR (FLU A&B, COVID) ARPGX2
Influenza A by PCR: NEGATIVE
Influenza B by PCR: NEGATIVE
SARS Coronavirus 2 by RT PCR: NEGATIVE

## 2022-05-31 LAB — HCG, QUANTITATIVE, PREGNANCY: hCG, Beta Chain, Quant, S: 3 m[IU]/mL (ref <5)

## 2022-05-31 LAB — CBG MONITORING, ED: Glucose-Capillary: 126 mg/dL — ABNORMAL HIGH (ref 70–99)

## 2022-05-31 LAB — TSH: TSH: 0.099 u[IU]/mL — ABNORMAL LOW (ref 0.350–4.500)

## 2022-05-31 LAB — T4, FREE: Free T4: 1.43 ng/dL — ABNORMAL HIGH (ref 0.61–1.12)

## 2022-05-31 MED ORDER — METHIMAZOLE 5 MG PO TABS
5.0000 mg | ORAL_TABLET | Freq: Once | ORAL | Status: AC
  Administered 2022-05-31: 5 mg via ORAL
  Filled 2022-05-31: qty 1

## 2022-05-31 MED ORDER — MORPHINE SULFATE (PF) 4 MG/ML IV SOLN: 4.0000 mg | INTRAVENOUS | Status: DC | PRN

## 2022-05-31 MED ORDER — SODIUM CHLORIDE 0.9 % IV SOLN: INTRAVENOUS | Status: DC

## 2022-05-31 MED ORDER — IOHEXOL 300 MG/ML  SOLN
100.0000 mL | Freq: Once | INTRAMUSCULAR | Status: AC | PRN
  Administered 2022-05-31: 100 mL via INTRAVENOUS

## 2022-05-31 MED ORDER — ONDANSETRON 4 MG PO TBDP
4.0000 mg | ORAL_TABLET | Freq: Once | ORAL | Status: AC
  Administered 2022-05-31: 4 mg via ORAL
  Filled 2022-05-31: qty 1

## 2022-05-31 MED ORDER — ENOXAPARIN SODIUM 40 MG/0.4ML IJ SOSY
40.0000 mg | PREFILLED_SYRINGE | INTRAMUSCULAR | Status: DC
  Administered 2022-05-31 – 2022-06-01 (×2): 40 mg via SUBCUTANEOUS
  Filled 2022-05-31 (×2): qty 0.4

## 2022-05-31 MED ORDER — ONDANSETRON 4 MG PO TBDP: 4.0000 mg | ORAL_TABLET | Freq: Four times a day (QID) | ORAL | Status: DC | PRN

## 2022-05-31 MED ORDER — ACETAMINOPHEN 650 MG RE SUPP: 650.0000 mg | Freq: Four times a day (QID) | RECTAL | Status: DC | PRN

## 2022-05-31 MED ORDER — METHIMAZOLE 5 MG PO TABS
5.0000 mg | ORAL_TABLET | Freq: Two times a day (BID) | ORAL | Status: DC
  Administered 2022-05-31 – 2022-06-02 (×4): 5 mg via ORAL
  Filled 2022-05-31 (×4): qty 1

## 2022-05-31 MED ORDER — DIPHENHYDRAMINE HCL 12.5 MG/5ML PO ELIX: 12.5000 mg | ORAL_SOLUTION | Freq: Four times a day (QID) | ORAL | Status: DC | PRN

## 2022-05-31 MED ORDER — SODIUM CHLORIDE 0.9 % IV BOLUS
1000.0000 mL | Freq: Once | INTRAVENOUS | Status: AC
  Administered 2022-05-31: 1000 mL via INTRAVENOUS

## 2022-05-31 MED ORDER — PANTOPRAZOLE SODIUM 40 MG IV SOLR
40.0000 mg | Freq: Every day | INTRAVENOUS | Status: DC
  Administered 2022-05-31 – 2022-06-01 (×2): 40 mg via INTRAVENOUS
  Filled 2022-05-31 (×2): qty 10

## 2022-05-31 MED ORDER — KETOROLAC TROMETHAMINE 30 MG/ML IJ SOLN
30.0000 mg | Freq: Once | INTRAMUSCULAR | Status: AC
  Administered 2022-05-31: 30 mg via INTRAVENOUS
  Filled 2022-05-31: qty 1

## 2022-05-31 MED ORDER — DIPHENHYDRAMINE HCL 50 MG/ML IJ SOLN: 12.5000 mg | Freq: Four times a day (QID) | INTRAMUSCULAR | Status: DC | PRN

## 2022-05-31 MED ORDER — SODIUM CHLORIDE 0.9 % IV SOLN
2.0000 g | Freq: Every day | INTRAVENOUS | Status: DC
  Administered 2022-05-31 – 2022-06-01 (×2): 2 g via INTRAVENOUS
  Filled 2022-05-31 (×2): qty 20

## 2022-05-31 MED ORDER — ONDANSETRON HCL 4 MG/2ML IJ SOLN
4.0000 mg | Freq: Four times a day (QID) | INTRAMUSCULAR | Status: DC | PRN
  Administered 2022-06-01: 4 mg via INTRAVENOUS
  Filled 2022-05-31: qty 2

## 2022-05-31 MED ORDER — CIPROFLOXACIN IN D5W 400 MG/200ML IV SOLN
400.0000 mg | Freq: Once | INTRAVENOUS | Status: AC
  Administered 2022-05-31: 400 mg via INTRAVENOUS
  Filled 2022-05-31: qty 200

## 2022-05-31 MED ORDER — ACETAMINOPHEN 325 MG PO TABS
650.0000 mg | ORAL_TABLET | Freq: Four times a day (QID) | ORAL | Status: DC | PRN
  Administered 2022-05-31 – 2022-06-01 (×2): 650 mg via ORAL
  Filled 2022-05-31 (×3): qty 2

## 2022-05-31 MED ORDER — METRONIDAZOLE 500 MG/100ML IV SOLN
500.0000 mg | Freq: Once | INTRAVENOUS | Status: AC
  Administered 2022-05-31: 500 mg via INTRAVENOUS
  Filled 2022-05-31: qty 100

## 2022-05-31 NOTE — H&P (Signed)
Judy [name] is an 26 y.o. female.   Chief Complaint: Abd pain/ nausea/ vomiting HPI: 26 year old female who is one month post-partum with PMH of hyperthyroidism/ Graves disease presents with one day of abdominal pain, nausea, vomiting, headache.  Subjective fever.  She presented to the ED for evaluation.  WBC was elevated.  T. Bili 2.8.  CT scan shows acute cholecystitis with a CBD diameter at the upper limits of normal.  No clinical signs of jaundice.  Past Medical History:  Diagnosis Date   Anxiety    Asthma    History of preterm delivery    Hyperthyroidism    Post partum depression    Vaginal Pap smear, abnormal    Yeast infection of the vagina     Past Surgical History:  Procedure Laterality Date   NO PAST SURGERIES      Family History  Problem Relation Age of Onset   Hypertension Mother    Diabetes Mother    Anxiety disorder Mother    Asthma Mother    Breast cancer Mother    Stomach cancer Mother    Brain cancer Mother    Asthma Father    Hypertension Maternal Grandfather    Heart disease Paternal Grandmother    Heart disease Paternal Grandfather    Hypertension Paternal Aunt    Social History:  reports that she has never smoked. She has never used smokeless tobacco. She reports that she does not currently use alcohol. She reports that she does not currently use drugs.  Allergies:  Allergies  Allergen Reactions   Penicillins Hives   Latex Hives and Dermatitis   Tape Hives and Rash    Prior to Admission medications   Medication Sig Start Date End Date Taking? Authorizing Provider  acetaminophen (TYLENOL) 325 MG tablet Take 2 tablets (650 mg total) by mouth every 4 (four) hours as needed (for pain scale < 4). 04/25/22   Mercado-Ortiz, Lahoma Crocker, DO  benzocaine-Menthol (DERMOPLAST) 20-0.5 % AERO Apply 1 Application topically as needed for irritation (perineal discomfort). 04/25/22   Mercado-Ortiz, Lahoma Crocker, DO  ibuprofen (ADVIL) 600 MG tablet Take 1 tablet  (600 mg total) by mouth every 6 (six) hours. 04/25/22   Mercado-Ortiz, Lahoma Crocker, DO  methimazole (TAPAZOLE) 5 MG tablet Take 1 tablet (5 mg total) by mouth 2 (two) times daily. 04/08/22   Hermina Staggers, MD  omeprazole (PRILOSEC) 20 MG capsule Take 1 capsule (20 mg total) by mouth daily. Patient not taking: Reported on 04/24/2022 04/08/22   Hermina Staggers, MD  senna-docusate (SENOKOT-S) 8.6-50 MG tablet Take 2 tablets by mouth daily. 04/26/22   Mercado-Ortiz, Lahoma Crocker, DO  witch hazel-glycerin (TUCKS) pad Apply 1 Application topically as needed for hemorrhoids. 04/25/22   Mercado-Ortiz, Lahoma Crocker, DO     Results for orders placed or performed during the hospital encounter of 05/31/22 (from the past 48 hour(s))  TSH     Status: Abnormal   Collection Time: 05/31/22  9:56 AM  Result Value Ref Range   TSH 0.099 (L) 0.350 - 4.500 uIU/mL    Comment: Performed by a 3rd Generation assay with a functional sensitivity of <=0.01 uIU/mL. Performed at [facility], [address] [place], [place] [address]   CBC     Status: Abnormal   Collection Time: 05/31/22  9:56 AM  Result Value Ref Range   WBC 17.4 (H) 4.0 - 10.5 K/uL   RBC 5.52 (H) 3.87 - 5.11 MIL/uL  Hemoglobin 13.5 12.0 - 15.0 g/dL   HCT 42.4 36.0 - 46.0 %   MCV 76.8 (L) 80.0 - 100.0 fL   MCH 24.5 (L) 26.0 - 34.0 pg   MCHC 31.8 30.0 - 36.0 g/dL   RDW 15.3 11.5 - 15.5 %   Platelets 295 150 - 400 K/uL   nRBC 0.0 0.0 - 0.2 %    Comment: Performed at [facility], [place] [address] [place], [place] [address]  Comprehensive metabolic panel     Status: Abnormal   Collection Time: 05/31/22  9:56 AM  Result Value Ref Range   Sodium 141 135 - 145 mmol/L   Potassium 3.3 (L) 3.5 - 5.1 mmol/L   Chloride 108 98 - 111 mmol/L   CO2 25 22 - 32 mmol/L   Glucose, Bld 122 (H) 70 - 99 mg/dL    Comment: Glucose reference range applies only to samples taken after fasting for at least 8 hours.   BUN 9 6 - 20 mg/dL    Creatinine, Ser 0.80 0.44 - 1.00 mg/dL   Calcium 9.4 8.9 - 10.3 mg/dL   Total Protein 8.1 6.5 - 8.1 g/dL   Albumin 4.4 3.5 - 5.0 g/dL   AST 638 (H) 15 - 41 U/L   ALT 355 (H) 0 - 44 U/L   Alkaline Phosphatase 94 38 - 126 U/L   Total Bilirubin 2.8 (H) 0.3 - 1.2 mg/dL   GFR, Estimated >60 >60 mL/min    Comment: (NOTE) Calculated using the CKD-EPI Creatinine Equation (2021)    Anion gap 8 5 - 15    Comment: Performed at [facility], [place] [address] [place], [place] [address]  POC CBG, ED     Status: Abnormal   Collection Time: 05/31/22  9:56 AM  Result Value Ref Range   Glucose-Capillary 126 (H) 70 - 99 mg/dL    Comment: Glucose reference range applies only to samples taken after fasting for at least 8 hours.  Lipase, blood     Status: None   Collection Time: 05/31/22  9:56 AM  Result Value Ref Range   Lipase 45 11 - 51 U/L    Comment: Performed at [facility], [place] [address] [place], [place] [address]  hCG, quantitative, pregnancy     Status: None   Collection Time: 05/31/22  9:56 AM  Result Value Ref Range   hCG, Beta Chain, Quant, S 3 <5 mIU/mL    Comment:          GEST. AGE      CONC.  (mIU/mL)   <=1 WEEK        5 - 50     2 WEEKS       50 - 500     3 WEEKS       100 - 10,000     4 WEEKS     1,000 - 30,000     5 WEEKS     3,500 - 115,000   6-8 WEEKS     12,000 - 270,000    12 WEEKS     15,000 - 220,000        FEMALE AND NON-PREGNANT FEMALE:     LESS THAN 5 mIU/mL Performed at [facility], [place] [address], [place], [place] [address]   Resp Panel by RT-PCR (Flu A&B, Covid) Anterior Nasal Swab     Status: None   Collection Time: 05/31/22 10:33 AM   Specimen: Anterior Nasal  Swab  Result Value Ref Range   SARS Coronavirus 2 by RT PCR NEGATIVE NEGATIVE    Comment: (NOTE) SARS-CoV-2 target nucleic acids are NOT DETECTED.  The SARS-CoV-2 RNA is generally detectable in upper respiratory specimens during the acute  phase of infection. The lowest concentration of SARS-CoV-2 viral copies this assay can detect is 138 copies/mL. A negative result does not preclude SARS-Cov-2 infection and should not be used as the sole basis for treatment or other patient management decisions. A negative result may occur with  improper specimen collection/handling, submission of specimen other than nasopharyngeal swab, presence of viral mutation(s) within the areas targeted by this assay, and inadequate number of viral copies(<138 copies/mL). A negative result must be combined with clinical observations, patient history, and epidemiological information. The expected result is Negative.  Fact Sheet for Patients:  EntrepreneurPulse.com.au  Fact Sheet for Healthcare Providers:  IncredibleEmployment.be  This test is no t yet approved or cleared by the [place] FDA and  has been authorized for detection and/or diagnosis of SARS-CoV-2 by FDA under an Emergency Use Authorization (EUA). This EUA will remain  in effect (meaning this test can be used) for the duration of the COVID-19 declaration under Section 564(b)(1) of the Act, 21 U.S.C.section 360bbb-3(b)(1), unless the authorization is terminated  or revoked sooner.       Influenza A by PCR NEGATIVE NEGATIVE   Influenza B by PCR NEGATIVE NEGATIVE    Comment: (NOTE) The Xpert Xpress SARS-CoV-2/FLU/RSV plus assay is intended as an aid in the diagnosis of influenza from Nasopharyngeal swab specimens and should not be used as a sole basis for treatment. Nasal washings and aspirates are unacceptable for Xpert Xpress SARS-CoV-2/FLU/RSV testing.  Fact Sheet for Patients: EntrepreneurPulse.com.au  Fact Sheet for Healthcare Providers: IncredibleEmployment.be  This test is not yet approved or cleared by the [place] FDA and has been authorized for detection and/or diagnosis of SARS-CoV-2  by FDA under an Emergency Use Authorization (EUA). This EUA will remain in effect (meaning this test can be used) for the duration of the COVID-19 declaration under Section 564(b)(1) of the Act, 21 U.S.C. section 360bbb-3(b)(1), unless the authorization is terminated or revoked.  Performed at [facility], [place] [address], [place], [place] [address]    CT ABDOMEN PELVIS W CONTRAST  Result Date: 05/31/2022 CLINICAL DATA:  Epigastric abdominal pain, nausea EXAM: CT ABDOMEN AND PELVIS WITH CONTRAST TECHNIQUE: Multidetector CT imaging of the abdomen and pelvis was performed using the standard protocol following bolus administration of intravenous contrast. RADIATION DOSE REDUCTION: This exam was performed according to the departmental dose-optimization program which includes automated exposure control, adjustment of the mA and/or kV according to patient size and/or use of iterative reconstruction technique. CONTRAST:  125mL OMNIPAQUE IOHEXOL 300 MG/ML  SOLN COMPARISON:  None Available. FINDINGS: Lower chest: No acute abnormality. Hepatobiliary: No focal liver abnormality is seen. Gallbladder wall thickening and pericholecystic fluid (series 2, image 26). Intrahepatic biliary ductal dilatation, the central common bile duct near the upper limit of normal in caliber at 0.7 cm and tapering smoothly to the ampulla without visualized calculus or other obstruction (series 4, image 47). Pancreas: Unremarkable. No pancreatic ductal dilatation or surrounding inflammatory changes. Spleen: Normal in size without significant abnormality. Adrenals/Urinary Tract: Adrenal glands are unremarkable. Kidneys are normal, without renal calculi, solid lesion, or hydronephrosis. Bladder is unremarkable. Stomach/Bowel: Stomach is within normal limits. Appendix appears normal. No evidence of bowel wall thickening, distention, or inflammatory changes. Vascular/Lymphatic: No significant vascular  findings are  present. No enlarged abdominal or pelvic lymph nodes. Reproductive: No mass or other significant abnormality. Simple appearing cyst of the right ovary measuring 3.3 x 2.3 cm, benign, for which no further follow-up or characterization is required (series 2, image 69). No follow-up imaging recommended. Note: This recommendation does not apply to premenarchal patients and to those with increased risk (genetic, family history, elevated tumor markers or other high-risk factors) of ovarian cancer. Reference: JACR 2020 Feb; 17(2):248-254 Other: No abdominal wall hernia or abnormality. No ascites. Musculoskeletal: No acute or significant osseous findings. IMPRESSION: 1. Gallbladder wall thickening and pericholecystic fluid, concerning for acute cholecystitis. 2. Intrahepatic biliary ductal dilatation, the central common bile duct near the upper limit of normal in caliber at 0.7 cm and tapering smoothly to the ampulla without visualized calculus or other obstruction. 3. Findings are concerning for acute cholecystitis. Consider right upper quadrant ultrasound, nuclear scintigraphic HIDA, and or MRI/MRCP to further evaluate. Electronically Signed   By: Delanna Ahmadi M.D.   On: 05/31/2022 13:13   DG Chest Port 1 View  Result Date: 05/31/2022 CLINICAL DATA:  Shortness of breath. EXAM: PORTABLE CHEST 1 VIEW COMPARISON:  Chest XR, 04/28/2007. FINDINGS: Cardiomediastinal silhouette is within normal limits. Lungs are well inflated. No focal consolidation or mass. No pleural effusion or pneumothorax. No acute displaced fracture. IMPRESSION: Normal chest. Electronically Signed   By: Michaelle Birks M.D.   On: 05/31/2022 09:52    Review of Systems  HENT:  Negative for ear discharge, ear pain, hearing loss and tinnitus.   Eyes:  Negative for photophobia and pain.  Respiratory:  Negative for cough and shortness of breath.   Cardiovascular:  Negative for chest pain.  Gastrointestinal:  Positive for abdominal pain, nausea and  vomiting.  Genitourinary:  Negative for dysuria, flank pain, frequency and urgency.  Musculoskeletal:  Negative for back pain, myalgias and neck pain.  Neurological:  Positive for headaches. Negative for dizziness.  Hematological:  Does not bruise/bleed easily.  Psychiatric/Behavioral:  The patient is not nervous/anxious.     Blood pressure 124/72, pulse 76, temperature 99.2 F (37.3 C), temperature source Oral, resp. rate 18, last menstrual period 08/18/2021, SpO2 99 %, unknown if currently breastfeeding. Physical Exam  Per EDPA evaluation, patient has epigastric abdominal pain Assessment/Plan Acute calculus cholecystitis Elevated bilirubin, possible choledocholithiasis  Admit for IV antibiotics Clear liquids now, NPO p MN Repeat LFT's in AM - if t. Bili improved, will probably proceed with laparoscopic cholecystectomy this admission.  If t. Bili increasing, will obtain MRCP to rule out choledocholithiasis.  Will discuss further with the patient tomorrow morning.  Imogene Burn. Georgette Dover, MD, [facility]  General Surgery   05/31/2022 1:40 PM   Maia Petties, MD 05/31/2022, 1:35 PM

## 2022-05-31 NOTE — ED Provider Notes (Signed)
[facility]-EMERGENCY DEPT Provider Note   CSN: [id number] Arrival date & time: 05/31/22  4097     History  Chief Complaint  Patient presents with   Abdominal Pain   Nausea    Judy [name] is a 26 y.o. female with a past medical history of Graves' disease and associated hyperthyroidism, anxiety, asthma presenting to the Emergency Department for evaluation of nausea, vomiting, abdominal pain.  Patient states she has had nausea, vomiting, epigastric abdominal pain, headache, dizziness since last night.  States her symptoms are similar to another episode of hyperthyroidism that she had 2 years ago.  Reports subjective fever last night.  States she feel like her heart was racing.  Reports diaphoresis and constipation.  Patient has been compliant with her methimazole for hyperthyroidism.   Abdominal Pain     Past Medical History:  Diagnosis Date   Anxiety    Asthma    History of preterm delivery    Hyperthyroidism    Post partum depression    Vaginal Pap smear, abnormal    Yeast infection of the vagina    Past Surgical History:  Procedure Laterality Date   NO PAST SURGERIES       Home Medications Prior to Admission medications   Medication Sig Start Date End Date Taking? Authorizing Provider  acetaminophen (TYLENOL) 325 MG tablet Take 2 tablets (650 mg total) by mouth every 4 (four) hours as needed (for pain scale < 4). Patient taking differently: Take 650 mg by mouth every 4 (four) hours as needed for moderate pain (for pain scale < 4). 04/25/22  Yes Mercado-Ortiz, Lahoma Crocker, DO  methimazole (TAPAZOLE) 5 MG tablet Take 1 tablet (5 mg total) by mouth 2 (two) times daily. 04/08/22  Yes Hermina Staggers, MD  benzocaine-Menthol (DERMOPLAST) 20-0.5 % AERO Apply 1 Application topically as needed for irritation (perineal discomfort). Patient not taking: Reported on 05/31/2022 04/25/22   Mercado-Ortiz, Lahoma Crocker, DO  ibuprofen (ADVIL) 600 MG tablet Take 1  tablet (600 mg total) by mouth every 6 (six) hours. Patient not taking: Reported on 05/31/2022 04/25/22   Mercado-Ortiz, Lahoma Crocker, DO  omeprazole (PRILOSEC) 20 MG capsule Take 1 capsule (20 mg total) by mouth daily. Patient not taking: Reported on 04/24/2022 04/08/22   Hermina Staggers, MD  senna-docusate (SENOKOT-S) 8.6-50 MG tablet Take 2 tablets by mouth daily. Patient not taking: Reported on 05/31/2022 04/26/22   Myrtie Hawk, DO  witch hazel-glycerin (TUCKS) pad Apply 1 Application topically as needed for hemorrhoids. Patient not taking: Reported on 05/31/2022 04/25/22   Myrtie Hawk, DO      Allergies    Penicillins, Latex, and Tape    Review of Systems   Review of Systems  Gastrointestinal:  Positive for abdominal pain.    Physical Exam Updated Vital Signs BP (!) 96/49 (BP Location: Left Arm)   Pulse 66   Temp 99.1 F (37.3 C) (Oral)   Resp 18   Ht 5' 2.5" (1.588 m)   Wt 57.1 kg   LMP 08/18/2021 (Exact Date)   SpO2 100%   BMI 22.66 kg/m  Physical Exam Vitals and nursing note reviewed.  Constitutional:      Appearance: Normal appearance.  HENT:     Head: Normocephalic and atraumatic.     Mouth/Throat:     Mouth: Mucous membranes are moist.  Eyes:     General: No scleral icterus. Cardiovascular:     Rate and Rhythm: Normal rate and regular rhythm.  Pulses: Normal pulses.     Heart sounds: Normal heart sounds.  Pulmonary:     Effort: Pulmonary effort is normal.     Breath sounds: Normal breath sounds.  Abdominal:     General: Abdomen is flat.     Palpations: Abdomen is soft.     Tenderness: There is abdominal tenderness in the epigastric area.  Musculoskeletal:        General: No deformity.  Skin:    General: Skin is warm.     Findings: No rash.  Neurological:     General: No focal deficit present.     Mental Status: She is alert.  Psychiatric:        Mood and Affect: Mood normal.     ED Results / Procedures / Treatments    Labs (all labs ordered are listed, but only abnormal results are displayed) Labs Reviewed  TSH - Abnormal; Notable for the following components:      Result Value   TSH 0.099 (*)    All other components within normal limits  T4, FREE - Abnormal; Notable for the following components:   Free T4 1.43 (*)    All other components within normal limits  CBC - Abnormal; Notable for the following components:   WBC 17.4 (*)    RBC 5.52 (*)    MCV 76.8 (*)    MCH 24.5 (*)    All other components within normal limits  COMPREHENSIVE METABOLIC PANEL - Abnormal; Notable for the following components:   Potassium 3.3 (*)    Glucose, Bld 122 (*)    AST 638 (*)    ALT 355 (*)    Total Bilirubin 2.8 (*)    All other components within normal limits  CBG MONITORING, ED - Abnormal; Notable for the following components:   Glucose-Capillary 126 (*)    All other components within normal limits  RESP PANEL BY RT-PCR (FLU A&B, COVID) ARPGX2  LIPASE, BLOOD  HCG, QUANTITATIVE, PREGNANCY  T3, FREE  URINALYSIS, ROUTINE W REFLEX MICROSCOPIC  HIV ANTIBODY (ROUTINE TESTING W REFLEX)  COMPREHENSIVE METABOLIC PANEL  CBC    EKG EKG Interpretation  Date/Time:  Sunday May 31 2022 09:54:25 EST Ventricular Rate:  74 PR Interval:  163 QRS Duration: 92 QT Interval:  388 QTC Calculation: 431 R Axis:   78 Text Interpretation: Sinus arrhythmia Confirmed by Tretha Sciara [phone]) on 05/31/2022 1:17:02 PM  Radiology CT ABDOMEN PELVIS W CONTRAST  Result Date: 05/31/2022 CLINICAL DATA:  Epigastric abdominal pain, nausea EXAM: CT ABDOMEN AND PELVIS WITH CONTRAST TECHNIQUE: Multidetector CT imaging of the abdomen and pelvis was performed using the standard protocol following bolus administration of intravenous contrast. RADIATION DOSE REDUCTION: This exam was performed according to the departmental dose-optimization program which includes automated exposure control, adjustment of the mA and/or kV according  to patient size and/or use of iterative reconstruction technique. CONTRAST:  183mL OMNIPAQUE IOHEXOL 300 MG/ML  SOLN COMPARISON:  None Available. FINDINGS: Lower chest: No acute abnormality. Hepatobiliary: No focal liver abnormality is seen. Gallbladder wall thickening and pericholecystic fluid (series 2, image 26). Intrahepatic biliary ductal dilatation, the central common bile duct near the upper limit of normal in caliber at 0.7 cm and tapering smoothly to the ampulla without visualized calculus or other obstruction (series 4, image 47). Pancreas: Unremarkable. No pancreatic ductal dilatation or surrounding inflammatory changes. Spleen: Normal in size without significant abnormality. Adrenals/Urinary Tract: Adrenal glands are unremarkable. Kidneys are normal, without renal calculi, solid lesion, or hydronephrosis. Bladder  is unremarkable. Stomach/Bowel: Stomach is within normal limits. Appendix appears normal. No evidence of bowel wall thickening, distention, or inflammatory changes. Vascular/Lymphatic: No significant vascular findings are present. No enlarged abdominal or pelvic lymph nodes. Reproductive: No mass or other significant abnormality. Simple appearing cyst of the right ovary measuring 3.3 x 2.3 cm, benign, for which no further follow-up or characterization is required (series 2, image 69). No follow-up imaging recommended. Note: This recommendation does not apply to premenarchal patients and to those with increased risk (genetic, family history, elevated tumor markers or other high-risk factors) of ovarian cancer. Reference: JACR 2020 Feb; 17(2):248-254 Other: No abdominal wall hernia or abnormality. No ascites. Musculoskeletal: No acute or significant osseous findings. IMPRESSION: 1. Gallbladder wall thickening and pericholecystic fluid, concerning for acute cholecystitis. 2. Intrahepatic biliary ductal dilatation, the central common bile duct near the upper limit of normal in caliber at 0.7 cm and  tapering smoothly to the ampulla without visualized calculus or other obstruction. 3. Findings are concerning for acute cholecystitis. Consider right upper quadrant ultrasound, nuclear scintigraphic HIDA, and or MRI/MRCP to further evaluate. Electronically Signed   By: Delanna Ahmadi M.D.   On: 05/31/2022 13:13   DG Chest Port 1 View  Result Date: 05/31/2022 CLINICAL DATA:  Shortness of breath. EXAM: PORTABLE CHEST 1 VIEW COMPARISON:  Chest XR, 04/28/2007. FINDINGS: Cardiomediastinal silhouette is within normal limits. Lungs are well inflated. No focal consolidation or mass. No pleural effusion or pneumothorax. No acute displaced fracture. IMPRESSION: Normal chest. Electronically Signed   By: Michaelle Birks M.D.   On: 05/31/2022 09:52    Procedures Procedures    Medications Ordered in ED Medications  methimazole (TAPAZOLE) tablet 5 mg (has no administration in time range)  enoxaparin (LOVENOX) injection 40 mg (has no administration in time range)  cefTRIAXone (ROCEPHIN) 2 g in sodium chloride 0.9 % 100 mL IVPB (2 g Intravenous New Bag/Given 05/31/22 1621)  acetaminophen (TYLENOL) tablet 650 mg (650 mg Oral Given 05/31/22 1621)    Or  acetaminophen (TYLENOL) suppository 650 mg ( Rectal See Alternative 05/31/22 1621)  morphine (PF) 4 MG/ML injection 4 mg (has no administration in time range)  ondansetron (ZOFRAN-ODT) disintegrating tablet 4 mg (has no administration in time range)    Or  ondansetron (ZOFRAN) injection 4 mg (has no administration in time range)  diphenhydrAMINE (BENADRYL) 12.5 MG/5ML elixir 12.5 mg (has no administration in time range)    Or  diphenhydrAMINE (BENADRYL) injection 12.5 mg (has no administration in time range)  pantoprazole (PROTONIX) injection 40 mg (has no administration in time range)  0.9 %  sodium chloride infusion ( Intravenous New Bag/Given 05/31/22 1617)  ondansetron (ZOFRAN-ODT) disintegrating tablet 4 mg (4 mg Oral Given 05/31/22 1014)  ketorolac  (TORADOL) 30 MG/ML injection 30 mg (30 mg Intravenous Given 05/31/22 1013)  methimazole (TAPAZOLE) tablet 5 mg (5 mg Oral Given 05/31/22 1201)  sodium chloride 0.9 % bolus 1,000 mL (0 mLs Intravenous Stopped 05/31/22 1155)  iohexol (OMNIPAQUE) 300 MG/ML solution 100 mL (100 mLs Intravenous Contrast Given 05/31/22 1254)  ciprofloxacin (CIPRO) IVPB 400 mg (400 mg Intravenous New Bag/Given 05/31/22 1440)    And  metroNIDAZOLE (FLAGYL) IVPB 500 mg (500 mg Intravenous New Bag/Given 05/31/22 1440)    ED Course/ Medical Decision Making/ A&P Clinical Course as of 05/31/22 1750  Sun May 31, 2022  0942 Stable 77 YOF with hx of asthma. Here for N/V. Looks well. [CC]  1317 Cholecystisis admit [CC]    Clinical Course User Index [  CC] Tretha Sciara, MD                           Medical Decision Making Amount and/or Complexity of Data Reviewed Labs: ordered. Radiology: ordered.  Risk Prescription drug management. Decision regarding hospitalization.   This patient presents to the ED for nausea, vomiting, epigastric abdominal pain, this involves an extensive number of treatment options, and is a complaint that carries with a high risk of complications and morbidity.  The differential diagnosis includes hypothyroidism, thyroid storm, PUD, gallstone, cholecystitis, liver disease, infectious etiology.  This is not an exhaustive list.  Comorbidities that complicate the patient evaluation See HPI  Social determinants of health NA  Additional history obtained: External records from outside source obtained and reviewed including: Chart review including previous notes, labs, imaging.  Cardiac monitoring/EKG: The patient was maintained on a cardiac monitor.  I personally reviewed and interpreted the cardiac monitor which showed an underlying rhythm of: Sinus rhythm.  Lab tests: I ordered and personally interpreted labs.  The pertinent results include: WBC 17.4. Hbg unremarkable. Platelets  unremarkable. No electrolyte abnormalities noted. BUN, creatinine unremarkable. LFT with elevated AST 638 and ALT 355. UA significant for no acute abnormality.  Resp panel negative for flu, covid.  Imaging studies: I ordered imaging studies including: Chest XR normal. CT abdomen and pelvis showed acute cholecystitis and intrahepatic biliary ductal dilatation. I personally reviewed, interpreted imaging and agree with the radiologist's interpretations.  Problem list/ ED course/ Critical interventions/ Medical management: HPI: See above Vital signs within normal range and stable throughout visit. Laboratory/imaging studies significant for: See above. On physical examination, patient is afebrile and appears in no acute distress. There was abdominal tenderness in the epigastric area.  Labs were remarkable for leukocytosis 17.64 CMP with elevated AST 638 and ALT 355 and bilirubin 2.8.  TSH 0.099.  Chest XR normal. CT abdomen and pelvis showed acute cholecystitis and intrahepatic biliary ductal dilatation. Based on patient's clinical presentations and laboratory/imaging studies I suspect acute cholecystitis.  I ordered Toradol.  And Zofran for pain and nausea. Reevaluation of the patient after these medications showed that the patient improved.  I feel that the patient may require further imaging and possible surgery.  Cipro and metronidazole ordered. I have reviewed the patient home medicines and have made adjustments as needed.  Consultations obtained: I requested consultation with general surgeon Dr. Georgette Dover, and discussed lab and imaging findings as well as pertinent plan.  He recommended IV antibiotics and patient being admitted to the hospital.  Disposition Patient is admitted to the hospital. This chart was dictated using voice recognition software.  Despite best efforts to proofread,  errors can occur which can change the documentation meaning.          Final Clinical Impression(s) / ED  Diagnoses Final diagnoses:  Acute cholecystitis    Rx / DC Orders ED Discharge Orders     None         Rex Kras, [place] 05/31/22 1750    Tretha Sciara, MD 06/02/22 2345

## 2022-05-31 NOTE — ED Triage Notes (Signed)
EMS reports from home, called out for abdominal pain and nausea since yesterday. Hx of hyperthyroid.   BP 110/62 HR 70 RR 16 Sp02 100 RA CBG 132 Temp 97.2

## 2022-05-31 NOTE — Plan of Care (Signed)
  Problem: Education: Goal: Knowledge of General Education information will improve Description Including pain rating scale, medication(s)/side effects and non-pharmacologic comfort measures Outcome: Progressing   

## 2022-06-01 ENCOUNTER — Encounter (HOSPITAL_COMMUNITY): Payer: Self-pay

## 2022-06-01 ENCOUNTER — Inpatient Hospital Stay (HOSPITAL_COMMUNITY): Payer: Medicaid Other | Admitting: Anesthesiology

## 2022-06-01 ENCOUNTER — Other Ambulatory Visit: Payer: Self-pay

## 2022-06-01 ENCOUNTER — Encounter (HOSPITAL_COMMUNITY): Admission: EM | Disposition: A | Discharge status: Home / Self Care | Payer: Self-pay | Source: Home / Self Care

## 2022-06-01 DIAGNOSIS — Z880 Allergy status to penicillin: Secondary | ICD-10-CM | POA: Diagnosis not present

## 2022-06-01 DIAGNOSIS — K819 Cholecystitis, unspecified: Secondary | ICD-10-CM | POA: Diagnosis present

## 2022-06-01 DIAGNOSIS — K810 Acute cholecystitis: Secondary | ICD-10-CM

## 2022-06-01 DIAGNOSIS — E876 Hypokalemia: Secondary | ICD-10-CM | POA: Diagnosis present

## 2022-06-01 DIAGNOSIS — E0590 Thyrotoxicosis, unspecified without thyrotoxic crisis or storm: Secondary | ICD-10-CM | POA: Diagnosis present

## 2022-06-01 DIAGNOSIS — Z79899 Other long term (current) drug therapy: Secondary | ICD-10-CM | POA: Diagnosis not present

## 2022-06-01 DIAGNOSIS — Z1152 Encounter for screening for COVID-19: Secondary | ICD-10-CM | POA: Diagnosis not present

## 2022-06-01 DIAGNOSIS — Z91040 Latex allergy status: Secondary | ICD-10-CM | POA: Diagnosis not present

## 2022-06-01 DIAGNOSIS — F419 Anxiety disorder, unspecified: Secondary | ICD-10-CM | POA: Diagnosis present

## 2022-06-01 DIAGNOSIS — Z818 Family history of other mental and behavioral disorders: Secondary | ICD-10-CM | POA: Diagnosis not present

## 2022-06-01 DIAGNOSIS — K5900 Constipation, unspecified: Secondary | ICD-10-CM | POA: Diagnosis not present

## 2022-06-01 DIAGNOSIS — Z91048 Other nonmedicinal substance allergy status: Secondary | ICD-10-CM | POA: Diagnosis not present

## 2022-06-01 HISTORY — PX: CHOLECYSTECTOMY: SHX55

## 2022-06-01 LAB — CBC
HCT: 35.4 % — ABNORMAL LOW (ref 36.0–46.0)
Hemoglobin: 11.2 g/dL — ABNORMAL LOW (ref 12.0–15.0)
MCH: 24.7 pg — ABNORMAL LOW (ref 26.0–34.0)
MCHC: 31.6 g/dL (ref 30.0–36.0)
MCV: 78.1 fL — ABNORMAL LOW (ref 80.0–100.0)
Platelets: 227 10*3/uL (ref 150–400)
RBC: 4.53 MIL/uL (ref 3.87–5.11)
RDW: 15.9 % — ABNORMAL HIGH (ref 11.5–15.5)
WBC: 11.3 10*3/uL — ABNORMAL HIGH (ref 4.0–10.5)
nRBC: 0 % (ref 0.0–0.2)

## 2022-06-01 LAB — MAGNESIUM: Magnesium: 1.7 mg/dL (ref 1.7–2.4)

## 2022-06-01 LAB — COMPREHENSIVE METABOLIC PANEL
ALT: 233 U/L — ABNORMAL HIGH (ref 0–44)
AST: 198 U/L — ABNORMAL HIGH (ref 15–41)
Albumin: 2.9 g/dL — ABNORMAL LOW (ref 3.5–5.0)
Alkaline Phosphatase: 77 U/L (ref 38–126)
Anion gap: 8 (ref 5–15)
BUN: 7 mg/dL (ref 6–20)
CO2: 21 mmol/L — ABNORMAL LOW (ref 22–32)
Calcium: 7.7 mg/dL — ABNORMAL LOW (ref 8.9–10.3)
Chloride: 111 mmol/L (ref 98–111)
Creatinine, Ser: 0.75 mg/dL (ref 0.44–1.00)
GFR, Estimated: >60 mL/min (ref >60)
Glucose, Bld: 77 mg/dL (ref 70–99)
Potassium: 3 mmol/L — ABNORMAL LOW (ref 3.5–5.1)
Sodium: 140 mmol/L (ref 135–145)
Total Bilirubin: 1.2 mg/dL (ref 0.3–1.2)
Total Protein: 6.6 g/dL (ref 6.5–8.1)

## 2022-06-01 LAB — HIV ANTIBODY (ROUTINE TESTING W REFLEX): HIV Screen 4th Generation wRfx: NONREACTIVE

## 2022-06-01 SURGERY — LAPAROSCOPIC CHOLECYSTECTOMY WITH INTRAOPERATIVE CHOLANGIOGRAM
Anesthesia: General

## 2022-06-01 MED ORDER — MIDAZOLAM HCL 2 MG/2ML IJ SOLN
INTRAMUSCULAR | Status: AC
  Filled 2022-06-01: qty 2

## 2022-06-01 MED ORDER — DEXAMETHASONE SODIUM PHOSPHATE 10 MG/ML IJ SOLN
INTRAMUSCULAR | Status: DC | PRN
  Administered 2022-06-01: 10 mg via INTRAVENOUS

## 2022-06-01 MED ORDER — DEXAMETHASONE SODIUM PHOSPHATE 10 MG/ML IJ SOLN
INTRAMUSCULAR | Status: AC
  Filled 2022-06-01: qty 1

## 2022-06-01 MED ORDER — SCOPOLAMINE 1 MG/3DAYS TD PT72: 1.0000 | MEDICATED_PATCH | TRANSDERMAL | Status: DC

## 2022-06-01 MED ORDER — OXYCODONE HCL 5 MG PO TABS: 5.0000 mg | ORAL_TABLET | Freq: Once | ORAL | Status: DC | PRN

## 2022-06-01 MED ORDER — SUCCINYLCHOLINE CHLORIDE 200 MG/10ML IV SOSY
PREFILLED_SYRINGE | INTRAVENOUS | Status: DC | PRN
  Administered 2022-06-01: 100 mg via INTRAVENOUS

## 2022-06-01 MED ORDER — HYDROMORPHONE HCL 1 MG/ML IJ SOLN
1.0000 mg | INTRAMUSCULAR | Status: DC | PRN
  Administered 2022-06-01 – 2022-06-02 (×3): 1 mg via INTRAVENOUS
  Filled 2022-06-01 (×3): qty 1

## 2022-06-01 MED ORDER — ROCURONIUM BROMIDE 10 MG/ML (PF) SYRINGE
PREFILLED_SYRINGE | INTRAVENOUS | Status: AC
  Filled 2022-06-01: qty 10

## 2022-06-01 MED ORDER — PROPOFOL 10 MG/ML IV BOLUS
INTRAVENOUS | Status: DC | PRN
  Administered 2022-06-01: 50 mg via INTRAVENOUS
  Administered 2022-06-01: 150 mg via INTRAVENOUS

## 2022-06-01 MED ORDER — LACTATED RINGERS IR SOLN
Status: DC | PRN
  Administered 2022-06-01: 1000 mL

## 2022-06-01 MED ORDER — ONDANSETRON HCL 4 MG/2ML IJ SOLN
INTRAMUSCULAR | Status: DC | PRN
  Administered 2022-06-01: 4 mg via INTRAVENOUS

## 2022-06-01 MED ORDER — INDOCYANINE GREEN 25 MG IV SOLR: 1.2500 mg | Freq: Once | INTRAVENOUS | Status: DC

## 2022-06-01 MED ORDER — MIDAZOLAM HCL 5 MG/5ML IJ SOLN
INTRAMUSCULAR | Status: DC | PRN
  Administered 2022-06-01: 2 mg via INTRAVENOUS

## 2022-06-01 MED ORDER — CHLORHEXIDINE GLUCONATE CLOTH 2 % EX PADS: 6.0000 | MEDICATED_PAD | Freq: Once | CUTANEOUS | Status: DC

## 2022-06-01 MED ORDER — PROPOFOL 10 MG/ML IV BOLUS
INTRAVENOUS | Status: AC
  Filled 2022-06-01: qty 20

## 2022-06-01 MED ORDER — OXYCODONE HCL 5 MG/5ML PO SOLN: 5.0000 mg | Freq: Once | ORAL | Status: DC | PRN

## 2022-06-01 MED ORDER — SUCCINYLCHOLINE CHLORIDE 200 MG/10ML IV SOSY
PREFILLED_SYRINGE | INTRAVENOUS | Status: AC
  Filled 2022-06-01: qty 10

## 2022-06-01 MED ORDER — LIDOCAINE HCL (PF) 2 % IJ SOLN
INTRAMUSCULAR | Status: AC
  Filled 2022-06-01: qty 5

## 2022-06-01 MED ORDER — FENTANYL CITRATE (PF) 250 MCG/5ML IJ SOLN
INTRAMUSCULAR | Status: AC
  Filled 2022-06-01: qty 5

## 2022-06-01 MED ORDER — FENTANYL CITRATE (PF) 100 MCG/2ML IJ SOLN
INTRAMUSCULAR | Status: DC | PRN
  Administered 2022-06-01: 50 ug via INTRAVENOUS
  Administered 2022-06-01: 150 ug via INTRAVENOUS
  Administered 2022-06-01 (×3): 50 ug via INTRAVENOUS

## 2022-06-01 MED ORDER — HEMOSTATIC AGENTS (NO CHARGE) OPTIME
TOPICAL | Status: DC | PRN
  Administered 2022-06-01: 1

## 2022-06-01 MED ORDER — LIDOCAINE 2% (20 MG/ML) 5 ML SYRINGE
INTRAMUSCULAR | Status: DC | PRN
  Administered 2022-06-01: 30 mg via INTRAVENOUS

## 2022-06-01 MED ORDER — SCOPOLAMINE 1 MG/3DAYS TD PT72
MEDICATED_PATCH | TRANSDERMAL | Status: AC
  Administered 2022-06-01: 1.5 mg via TRANSDERMAL
  Filled 2022-06-01: qty 1

## 2022-06-01 MED ORDER — BUPIVACAINE-EPINEPHRINE (PF) 0.5% -1:200000 IJ SOLN
INTRAMUSCULAR | Status: AC
  Filled 2022-06-01: qty 30

## 2022-06-01 MED ORDER — MEPERIDINE HCL 50 MG/ML IJ SOLN: 6.2500 mg | INTRAMUSCULAR | Status: DC | PRN

## 2022-06-01 MED ORDER — MIDAZOLAM HCL 2 MG/2ML IJ SOLN: 0.5000 mg | Freq: Once | INTRAMUSCULAR | Status: DC | PRN

## 2022-06-01 MED ORDER — CEFAZOLIN SODIUM 1 G IJ SOLR
INTRAMUSCULAR | Status: AC
  Filled 2022-06-01: qty 10

## 2022-06-01 MED ORDER — POLYETHYLENE GLYCOL 3350 17 G PO PACK
17.0000 g | PACK | Freq: Every day | ORAL | Status: DC | PRN
  Administered 2022-06-01: 17 g via ORAL
  Filled 2022-06-01: qty 1

## 2022-06-01 MED ORDER — SODIUM CHLORIDE 0.9 % IV SOLN: INTRAVENOUS | Status: DC

## 2022-06-01 MED ORDER — ONDANSETRON HCL 4 MG/2ML IJ SOLN
INTRAMUSCULAR | Status: AC
  Filled 2022-06-01: qty 2

## 2022-06-01 MED ORDER — LACTATED RINGERS IV SOLN: INTRAVENOUS | Status: DC

## 2022-06-01 MED ORDER — ROCURONIUM BROMIDE 10 MG/ML (PF) SYRINGE
PREFILLED_SYRINGE | INTRAVENOUS | Status: DC | PRN
  Administered 2022-06-01: 40 mg via INTRAVENOUS

## 2022-06-01 MED ORDER — POTASSIUM CHLORIDE 10 MEQ/100ML IV SOLN
10.0000 meq | INTRAVENOUS | Status: DC
  Administered 2022-06-01 (×2): 10 meq via INTRAVENOUS
  Filled 2022-06-01: qty 100

## 2022-06-01 MED ORDER — SPY AGENT GREEN - (INDOCYANINE FOR INJECTION)
INTRAMUSCULAR | Status: DC | PRN
  Administered 2022-06-01: .5 mL via INTRAVENOUS

## 2022-06-01 MED ORDER — PROMETHAZINE HCL 25 MG/ML IJ SOLN: 6.2500 mg | INTRAMUSCULAR | Status: DC | PRN

## 2022-06-01 MED ORDER — CEFAZOLIN SODIUM-DEXTROSE 2-3 GM-%(50ML) IV SOLR
INTRAVENOUS | Status: DC | PRN
  Administered 2022-06-01: 2 g via INTRAVENOUS

## 2022-06-01 MED ORDER — SUGAMMADEX SODIUM 200 MG/2ML IV SOLN
INTRAVENOUS | Status: DC | PRN
  Administered 2022-06-01: 200 mg via INTRAVENOUS

## 2022-06-01 MED ORDER — HYDROMORPHONE HCL 1 MG/ML IJ SOLN
INTRAMUSCULAR | Status: AC
  Filled 2022-06-01: qty 1

## 2022-06-01 MED ORDER — 0.9 % SODIUM CHLORIDE (POUR BTL) OPTIME
TOPICAL | Status: DC | PRN
  Administered 2022-06-01: 1000 mL

## 2022-06-01 MED ORDER — MORPHINE SULFATE (PF) 2 MG/ML IV SOLN
2.0000 mg | INTRAVENOUS | Status: DC | PRN
  Administered 2022-06-01 (×2): 2 mg via INTRAVENOUS
  Filled 2022-06-01 (×2): qty 1

## 2022-06-01 MED ORDER — BUPIVACAINE-EPINEPHRINE 0.5% -1:200000 IJ SOLN
INTRAMUSCULAR | Status: DC | PRN
  Administered 2022-06-01: 20 mL

## 2022-06-01 MED ORDER — HYDROMORPHONE HCL 1 MG/ML IJ SOLN
0.2500 mg | INTRAMUSCULAR | Status: DC | PRN
  Administered 2022-06-01 (×2): 0.5 mg via INTRAVENOUS

## 2022-06-01 SURGICAL SUPPLY — 50 items
ADH SKN CLS APL DERMABOND .7 (GAUZE/BANDAGES/DRESSINGS) ×1
APL PRP STRL LF DISP 70% ISPRP (MISCELLANEOUS) ×1
APL SKNCLS STERI-STRIP NONHPOA (GAUZE/BANDAGES/DRESSINGS)
APPLIER CLIP 5 13 M/L LIGAMAX5 (MISCELLANEOUS) ×1
APPLIER CLIP ROT 10 11.4 M/L (STAPLE)
APR CLP MED LRG 11.4X10 (STAPLE)
APR CLP MED LRG 5 ANG JAW (MISCELLANEOUS) ×1
BAG COUNTER SPONGE SURGICOUNT (BAG) IMPLANT
BAG SPEC RTRVL 10 TROC 200 (ENDOMECHANICALS) ×1
BAG SPNG CNTER NS LX DISP (BAG)
BENZOIN TINCTURE PRP APPL 2/3 (GAUZE/BANDAGES/DRESSINGS) IMPLANT
CABLE HIGH FREQUENCY MONO STRZ (ELECTRODE) ×1 IMPLANT
CHLORAPREP W/TINT 26 (MISCELLANEOUS) ×1 IMPLANT
CLIP APPLIE 5 13 M/L LIGAMAX5 (MISCELLANEOUS) ×1 IMPLANT
CLIP APPLIE ROT 10 11.4 M/L (STAPLE) IMPLANT
COVER MAYO STAND XLG (MISCELLANEOUS) ×1 IMPLANT
COVER SURGICAL LIGHT HANDLE (MISCELLANEOUS) ×1 IMPLANT
DERMABOND ADVANCED .7 DNX12 (GAUZE/BANDAGES/DRESSINGS) IMPLANT
DRAPE C-ARM 42X120 X-RAY (DRAPES) IMPLANT
ELECT REM PT RETURN 15FT ADLT (MISCELLANEOUS) ×1 IMPLANT
GLOVE BIO SURGEON STRL SZ7 (GLOVE) ×1 IMPLANT
GLOVE BIOGEL PI IND STRL 7.5 (GLOVE) ×1 IMPLANT
GOWN STRL REUS W/ TWL LRG LVL3 (GOWN DISPOSABLE) ×1 IMPLANT
GOWN STRL REUS W/TWL LRG LVL3 (GOWN DISPOSABLE) ×1
GRASPER SUT TROCAR 14GX15 (MISCELLANEOUS) IMPLANT
HEMOSTAT SNOW SURGICEL 2X4 (HEMOSTASIS) IMPLANT
IRRIG SUCT STRYKERFLOW 2 WTIP (MISCELLANEOUS) ×1
IRRIGATION SUCT STRKRFLW 2 WTP (MISCELLANEOUS) ×1 IMPLANT
KIT BASIN OR (CUSTOM PROCEDURE TRAY) ×1 IMPLANT
KIT TURNOVER KIT A (KITS) IMPLANT
PENCIL SMOKE EVACUATOR (MISCELLANEOUS) IMPLANT
POUCH RETRIEVAL ECOSAC 10 (ENDOMECHANICALS) ×1 IMPLANT
POUCH RETRIEVAL ECOSAC 10MM (ENDOMECHANICALS) ×1
PROTECTOR NERVE ULNAR (MISCELLANEOUS) IMPLANT
SCISSORS LAP 5X35 DISP (ENDOMECHANICALS) ×1 IMPLANT
SET CHOLANGIOGRAPH MIX (MISCELLANEOUS) IMPLANT
SET TUBE SMOKE EVAC HIGH FLOW (TUBING) ×1 IMPLANT
SLEEVE Z-THREAD 5X100MM (TROCAR) ×2 IMPLANT
SPIKE FLUID TRANSFER (MISCELLANEOUS) ×1 IMPLANT
STRIP CLOSURE SKIN 1/2X4 (GAUZE/BANDAGES/DRESSINGS) IMPLANT
SUT MNCRL AB 4-0 PS2 18 (SUTURE) ×1 IMPLANT
SUT VICRYL 0 TIES 12 18 (SUTURE) IMPLANT
SUT VICRYL 0 UR6 27IN ABS (SUTURE) IMPLANT
TOWEL OR 17X26 10 PK STRL BLUE (TOWEL DISPOSABLE) ×1 IMPLANT
TOWEL OR NON WOVEN STRL DISP B (DISPOSABLE) ×1 IMPLANT
TRAY LAPAROSCOPIC (CUSTOM PROCEDURE TRAY) ×1 IMPLANT
TROCAR 11X100 Z THREAD (TROCAR) IMPLANT
TROCAR BALLN 12MMX100 BLUNT (TROCAR) ×1 IMPLANT
TROCAR XCEL BLUNT TIP 100MML (ENDOMECHANICALS) IMPLANT
TROCAR Z-THREAD OPTICAL 5X100M (TROCAR) ×1 IMPLANT

## 2022-06-01 NOTE — Anesthesia Preprocedure Evaluation (Addendum)
Anesthesia Evaluation  Patient identified by MRN, date of birth, ID band Patient awake    Reviewed: Allergy & Precautions, NPO status , Patient's Chart, lab work & pertinent test results  History of Anesthesia Complications Negative for: history of anesthetic complications  Airway Mallampati: I  TM Distance: >3 FB Neck ROM: Full    Dental  (+) Dental Advisory Given, Chipped   Pulmonary asthma (rarely uses inhaler)    breath sounds clear to auscultation       Cardiovascular negative cardio ROS  Rhythm:Regular Rate:Normal     Neuro/Psych negative neurological ROS     GI/Hepatic ,GERD  Medicated and Controlled,,Elevated LFTs with acute chole   Endo/Other  negative endocrine ROS    Renal/GU negative Renal ROS     Musculoskeletal   Abdominal   Peds  Hematology negative hematology ROS (+)   Anesthesia Other Findings   Reproductive/Obstetrics                              Anesthesia Physical Anesthesia Plan  ASA: 2  Anesthesia Plan: General   Post-op Pain Management:    Induction: Intravenous  PONV Risk Score and Plan: 3 and Ondansetron, Dexamethasone and Scopolamine patch - Pre-op  Airway Management Planned: Oral ETT  Additional Equipment: None  Intra-op Plan:   Post-operative Plan: Extubation in OR  Informed Consent: I have reviewed the patients History and Physical, chart, labs and discussed the procedure including the risks, benefits and alternatives for the proposed anesthesia with the patient or authorized representative who has indicated his/her understanding and acceptance.     Dental advisory given  Plan Discussed with: CRNA and Surgeon  Anesthesia Plan Comments:          Anesthesia Quick Evaluation

## 2022-06-01 NOTE — Progress Notes (Signed)
Progress Note  Day of Surgery  Subjective: Hungry. Still with some RUQ abdominal pain but better than on admission. No nausea/emesis   Objective: Vital signs in last 24 hours: Temp:  [98.1 F (36.7 C)-99.2 F (37.3 C)] 98.1 F (36.7 C) (12/11 0557) Pulse Rate:  [53-85] 56 (12/11 0557) Resp:  [15-20] 18 (12/11 0557) BP: (96-124)/(49-82) 107/56 (12/11 0557) SpO2:  [97 %-100 %] 99 % (12/11 0557) Weight:  [57.1 kg] 57.1 kg (12/10 1629) Last BM Date : 05/30/22  Intake/Output from previous day: 12/10 0701 - 12/11 0700 In: 2066.8 [P.O.:360; I.V.:1306.8; IV Piggyback:400] Out: -  Intake/Output this shift: No intake/output data recorded.  PE: General: pleasant, WD, female who is laying in bed in NAD Lungs: Respiratory effort nonlabored Abd: soft, ND, +BS, mild TTP in RUQ without rebound or guarding MSK: all 4 extremities are symmetrical with no cyanosis, clubbing, or edema. Skin: warm and dry Psych: A&Ox3 with an appropriate affect.    Lab Results:  Recent Labs    05/31/22 0956 06/01/22 0316  WBC 17.4* 11.3*  HGB 13.5 11.2*  HCT 42.4 35.4*  PLT 295 227   BMET Recent Labs    05/31/22 0956 06/01/22 0316  NA 141 140  K 3.3* 3.0*  CL 108 111  CO2 25 21*  GLUCOSE 122* 77  BUN 9 7  CREATININE 0.80 0.75  CALCIUM 9.4 7.7*   PT/INR No results for input(s): "LABPROT", "INR" in the last 72 hours. CMP     Component Value Date/Time   NA 140 06/01/2022 0316   NA 138 10/24/2021 1130   K 3.0 (L) 06/01/2022 0316   CL 111 06/01/2022 0316   CO2 21 (L) 06/01/2022 0316   GLUCOSE 77 06/01/2022 0316   BUN 7 06/01/2022 0316   BUN 8 10/24/2021 1130   CREATININE 0.75 06/01/2022 0316   CREATININE 0.74 01/24/2014 1726   CALCIUM 7.7 (L) 06/01/2022 0316   PROT 6.6 06/01/2022 0316   PROT 7.6 10/24/2021 1130   ALBUMIN 2.9 (L) 06/01/2022 0316   ALBUMIN 4.4 10/24/2021 1130   AST 198 (H) 06/01/2022 0316   ALT 233 (H) 06/01/2022 0316   ALKPHOS 77 06/01/2022 0316   BILITOT  1.2 06/01/2022 0316   BILITOT 0.4 10/24/2021 1130   GFRNONAA >60 06/01/2022 0316   GFRAA >60 05/15/2019 1859   Lipase     Component Value Date/Time   LIPASE 45 05/31/2022 0956       Studies/Results: CT ABDOMEN PELVIS W CONTRAST  Result Date: 05/31/2022 CLINICAL DATA:  Epigastric abdominal pain, nausea EXAM: CT ABDOMEN AND PELVIS WITH CONTRAST TECHNIQUE: Multidetector CT imaging of the abdomen and pelvis was performed using the standard protocol following bolus administration of intravenous contrast. RADIATION DOSE REDUCTION: This exam was performed according to the departmental dose-optimization program which includes automated exposure control, adjustment of the mA and/or kV according to patient size and/or use of iterative reconstruction technique. CONTRAST:  129mL OMNIPAQUE IOHEXOL 300 MG/ML  SOLN COMPARISON:  None Available. FINDINGS: Lower chest: No acute abnormality. Hepatobiliary: No focal liver abnormality is seen. Gallbladder wall thickening and pericholecystic fluid (series 2, image 26). Intrahepatic biliary ductal dilatation, the central common bile duct near the upper limit of normal in caliber at 0.7 cm and tapering smoothly to the ampulla without visualized calculus or other obstruction (series 4, image 47). Pancreas: Unremarkable. No pancreatic ductal dilatation or surrounding inflammatory changes. Spleen: Normal in size without significant abnormality. Adrenals/Urinary Tract: Adrenal glands are unremarkable. Kidneys are normal, without  renal calculi, solid lesion, or hydronephrosis. Bladder is unremarkable. Stomach/Bowel: Stomach is within normal limits. Appendix appears normal. No evidence of bowel wall thickening, distention, or inflammatory changes. Vascular/Lymphatic: No significant vascular findings are present. No enlarged abdominal or pelvic lymph nodes. Reproductive: No mass or other significant abnormality. Simple appearing cyst of the right ovary measuring 3.3 x 2.3 cm,  benign, for which no further follow-up or characterization is required (series 2, image 69). No follow-up imaging recommended. Note: This recommendation does not apply to premenarchal patients and to those with increased risk (genetic, family history, elevated tumor markers or other high-risk factors) of ovarian cancer. Reference: JACR 2020 Feb; 17(2):248-254 Other: No abdominal wall hernia or abnormality. No ascites. Musculoskeletal: No acute or significant osseous findings. IMPRESSION: 1. Gallbladder wall thickening and pericholecystic fluid, concerning for acute cholecystitis. 2. Intrahepatic biliary ductal dilatation, the central common bile duct near the upper limit of normal in caliber at 0.7 cm and tapering smoothly to the ampulla without visualized calculus or other obstruction. 3. Findings are concerning for acute cholecystitis. Consider right upper quadrant ultrasound, nuclear scintigraphic HIDA, and or MRI/MRCP to further evaluate. Electronically Signed   By: Jearld Lesch M.D.   On: 05/31/2022 13:13   DG Chest Port 1 View  Result Date: 05/31/2022 CLINICAL DATA:  Shortness of breath. EXAM: PORTABLE CHEST 1 VIEW COMPARISON:  Chest XR, 04/28/2007. FINDINGS: Cardiomediastinal silhouette is within normal limits. Lungs are well inflated. No focal consolidation or mass. No pleural effusion or pneumothorax. No acute displaced fracture. IMPRESSION: Normal chest. Electronically Signed   By: Roanna Banning M.D.   On: 05/31/2022 09:52    Anti-infectives: Anti-infectives (From admission, onward)    Start     Dose/Rate Route Frequency Ordered Stop   05/31/22 1700  cefTRIAXone (ROCEPHIN) 2 g in sodium chloride 0.9 % 100 mL IVPB        2 g 200 mL/hr over 30 Minutes Intravenous Daily 05/31/22 1550 06/07/22 0959   05/31/22 1345  ciprofloxacin (CIPRO) IVPB 400 mg       See Hyperspace for full Linked Orders Report.   400 mg 200 mL/hr over 60 Minutes Intravenous  Once 05/31/22 1339 05/31/22 1540   05/31/22  1345  metroNIDAZOLE (FLAGYL) IVPB 500 mg       See Hyperspace for full Linked Orders Report.   500 mg 100 mL/hr over 60 Minutes Intravenous  Once 05/31/22 1339 05/31/22 1540        Assessment/Plan  Acute calculus cholecystitis Elevated bilirubin, possible choledocholithiasis - T bili improved this am - 1.2 from 2.8. Plan for lap chole with IOC today I have explained the procedure, risks, and aftercare of cholecystectomy.  Risks include but are not limited to bleeding, infection, wound problems, anesthesia, diarrhea, bile leak, injury to common bile duct/liver/intestine.  She seems to understand and agrees to proceed. - hypokalemia - replete IV. Check mag  FEN: NPO for OR ID: cipro/flagyl completed. Rocephin>> VTE: lovenox     LOS: 0 days   Eric Form, [facility] Surgery 06/01/2022, 9:31 AM Please see Amion for pager number during day hours 7:00am-4:30pm

## 2022-06-01 NOTE — Anesthesia Postprocedure Evaluation (Signed)
Anesthesia Post Note  Patient: Judy [name]  Procedure(s) Performed: LAPAROSCOPIC CHOLECYSTECTOMY     Patient location during evaluation: PACU Anesthesia Type: General Level of consciousness: sedated, patient cooperative and oriented Pain management: pain level controlled Vital Signs Assessment: post-procedure vital signs reviewed and stable Respiratory status: spontaneous breathing, nonlabored ventilation and respiratory function stable Cardiovascular status: blood pressure returned to baseline and stable Postop Assessment: no apparent nausea or vomiting Anesthetic complications: no   No notable events documented.  Last Vitals:  Vitals:   06/01/22 0557 06/01/22 1451  BP: (!) 107/56 (!) 140/60  Pulse: (!) 56 93  Resp: 18 10  Temp: 36.7 C 36.6 C  SpO2: 99% 98%    Last Pain:  Vitals:   06/01/22 1451  TempSrc:   PainSc: Asleep                 Philisha Weinel,E. Arther Heisler

## 2022-06-01 NOTE — Transfer of Care (Signed)
Immediate Anesthesia Transfer of Care Note  Patient: Judy [name]  Procedure(s) Performed: LAPAROSCOPIC CHOLECYSTECTOMY  Patient Location: PACU  Anesthesia Type:General  Level of Consciousness: awake, oriented, and patient cooperative  Airway & Oxygen Therapy: Patient Spontanous Breathing and Patient connected to face mask oxygen  Post-op Assessment: Report given to RN and Post -op Vital signs reviewed and stable  Post vital signs: Reviewed  Last Vitals:  Vitals Value Taken Time  BP 140/60 06/01/22 1451  Temp 36.6 C 06/01/22 1451  Pulse 87 06/01/22 1452  Resp 19 06/01/22 1452  SpO2 98 % 06/01/22 1452  Vitals shown include unvalidated device data.  Last Pain:  Vitals:   06/01/22 0600  TempSrc:   PainSc: 4       Patients Stated Pain Goal: 2 (06/01/22 0600)  Complications: No notable events documented.

## 2022-06-01 NOTE — Discharge Instructions (Signed)
[facility] LAPAROSCOPIC SURGERY: POST OP INSTRUCTIONS Always review your discharge instruction sheet given to you by the facility where your surgery was performed. IF YOU HAVE DISABILITY OR FAMILY LEAVE FORMS, YOU MUST BRING THEM TO THE OFFICE FOR PROCESSING.   DO NOT GIVE THEM TO YOUR DOCTOR.  PAIN CONTROL  First take acetaminophen (Tylenol) AND/or ibuprofen (Advil) to control your pain after surgery.  Follow directions on package.  Taking acetaminophen (Tylenol) and/or ibuprofen (Advil) regularly after surgery will help to control your pain and lower the amount of prescription pain medication you may need.  You should not take more than 3,000 mg (3 grams) of acetaminophen (Tylenol) in 24 hours.  You should not take ibuprofen (Advil), aleve, motrin, naprosyn or other NSAIDS if you have a history of stomach ulcers or chronic kidney disease.  A prescription for pain medication may be given to you upon discharge.  Take your pain medication as prescribed, if you still have uncontrolled pain after taking acetaminophen (Tylenol) or ibuprofen (Advil). Use ice packs to help control pain. If you need a refill on your pain medication, please contact your pharmacy.  They will contact our office to request authorization. Prescriptions will not be filled after 5pm or on week-ends.  HOME MEDICATIONS Take your usually prescribed medications unless otherwise directed.  DIET You should follow a light diet the first few days after arrival home.  Be sure to include lots of fluids daily. Avoid fatty, fried foods.   CONSTIPATION It is common to experience some constipation after surgery and if you are taking pain medication.  Increasing fluid intake and taking a stool softener (such as Colace) will usually help or prevent this problem from occurring.  A mild laxative (Milk of Magnesia or Miralax) should be taken according to package instructions if there are no bowel movements after 48  hours.  WOUND/INCISION CARE Most patients will experience some swelling and bruising in the area of the incisions.  Ice packs will help.  Swelling and bruising can take several days to resolve.  Unless discharge instructions indicate otherwise, follow guidelines below  STERI-STRIPS - you may remove your outer bandages 48 hours after surgery, and you may shower at that time.  You have steri-strips (small skin tapes) in place directly over the incision.  These strips should be left on the skin for 7-10 days.   DERMABOND/SKIN GLUE - you may shower in 24 hours.  The glue will flake off over the next 2-3 weeks. Any sutures or staples will be removed at the office during your follow-up visit.  ACTIVITIES You may resume regular (light) daily activities beginning the next day--such as daily self-care, walking, climbing stairs--gradually increasing activities as tolerated.  You may have sexual intercourse when it is comfortable.  Refrain from any heavy lifting or straining until approved by your doctor. You may drive when you are no longer taking prescription pain medication, you can comfortably wear a seatbelt, and you can safely maneuver your car and apply brakes.  FOLLOW-UP You should see your doctor in the office for a follow-up appointment approximately 2-3 weeks after your surgery.  You should have been given your post-op/follow-up appointment when your surgery was scheduled.  If you did not receive a post-op/follow-up appointment, make sure that you call for this appointment within a day or two after you arrive home to insure a convenient appointment time.   WHEN TO CALL YOUR DOCTOR: Fever over 101.0 Inability to urinate Continued bleeding from incision.   Increased pain, redness, or drainage from the incision. Increasing abdominal pain  The clinic staff is available to answer your questions during regular business hours.  Please don't hesitate to call and ask to speak to one of the nurses for  clinical concerns.  If you have a medical emergency, go to the nearest emergency room or call 911.  A surgeon from [facility] is always on call at the hospital. [address], [address], [place], [place]  [address] ? [address] [address], [place], [place]   [address] [phone] ? [phone] ? FAX [phone] Web site: [email or web address]  

## 2022-06-01 NOTE — Anesthesia Procedure Notes (Signed)
Procedure Name: Intubation Date/Time: 06/01/2022 1:50 PM  Performed by: Lovie Chol, CRNAPre-anesthesia Checklist: Patient identified, Emergency Drugs available, Suction available and Patient being monitored Patient Re-evaluated:Patient Re-evaluated prior to induction Oxygen Delivery Method: Circle System Utilized Preoxygenation: Pre-oxygenation with 100% oxygen Induction Type: IV induction, Rapid sequence and Cricoid Pressure applied Laryngoscope Size: Miller and 3 Grade View: Grade I Tube type: Oral Tube size: 7.0 mm Number of attempts: 1 Airway Equipment and Method: Stylet and Oral airway Placement Confirmation: ETT inserted through vocal cords under direct vision, positive ETCO2 and breath sounds checked- equal and bilateral Secured at: 21 cm Tube secured with: Tape Dental Injury: Teeth and Oropharynx as per pre-operative assessment

## 2022-06-01 NOTE — Op Note (Signed)
  Preoperative diagnosis: acute cholecystitis Postoperative diagnosis: Same as above Procedure: Laparoscopic cholecystectomy Surgeon: Dr Harden Mo Asst: Trixie Deis, PA-C Anesthesia: General  Estimated blood loss: Minimal Complications: None Drains: None Specimens: Gallbladder and contents to pathology Disposition to recovery stable condition   Indications: 26 year old female who is one month post-partum with PMH of hyperthyroidism/ Graves disease presents with one day of abdominal pain, nausea, vomiting, headache.  WBC was elevated.  T. Bili 2.8.  CT scan shows acute cholecystitis with a CBD diameter at the upper limits of normal. Her TB decreased following am. We discussed proceeding with lap chole.    Procedure: After informed consent was obtained she was taken to the operating room.  She was given antibiotics.  SCDs were placed.  She was placed under general anesthesia without complication.  She was prepped and draped in the standard sterile surgical fashion.  Surgical timeout was then performed.    I made a vertical incision below her umbilicus and carried this down to the fascia.   I incised the fascia and entered the abdomen bluntly.  There is no evidence of an entry injury.  I placed a 0 Vicryl pursestring suture to the fascia inserted a Hasson trocar.  Abdomen was insufflated 15 mmHg pressure. The gallbladder was noted to have acute cholecystitis. Gallbladder was then retracted cephalad and lateral.  I was able to dissect in the triangle and obtain a critical view of safety.  Tthis was difficult due to the inflammation but I was able to visualize the CBD.  I then clipped the cystic artery 3 times and divided leaving 2 clips in place.  I treated the duct in the similar fashion.  The clips completely traversed the duct and the duct was viable. I did not do a cholangiogram due to inflammation.  I then removed the gallbladder from the liver bed  I then removed the gallbladder from the  liver bed and placed in a retrieval bag. I did place surgicel snow in the gallbladder fossa.  Hemostasis was obtained.  I removed the gallbladder and its contents with the retrieval bag.  Hassan trocar was removed.  I tied the pursestring down.  I placed an additional 0 Vicryl suture to completely obliterate that defect.  I then remove the remaining trocars and desufflated the abdomen.  These were closed with 4-0 Monocryl and glue.  She tolerated this well was extubated and transferred recovery stable

## 2022-06-01 NOTE — Progress Notes (Signed)
Initial Nutrition Assessment  DOCUMENTATION CODES:   Not applicable  INTERVENTION:  - Advance diet as medically appropriate. - Recommend Boost Breeze po BID once diet advanced. Each supplement provides 250 kcal and 9 grams of protein.  - Encourage intake of small and frequent meals once diet advanced.  - Monitor weight trends.    NUTRITION DIAGNOSIS:   Inadequate oral intake related to acute illness, nausea, poor appetite, vomiting (cholecystitis) as evidenced by per patient/family report.  GOAL:   Patient will meet greater than or equal to 90% of their needs  MONITOR:   PO intake, Diet advancement, Supplement acceptance, Weight trends  REASON FOR ASSESSMENT:   Malnutrition Screening Tool    ASSESSMENT:   26 year old female who is one month post-partum with PMH of hyperthyroidism/ Graves disease presents with one day of abdominal pain, nausea, vomiting, headache.   Patient reports a UBW of 126# prior to recent pregnancy. Notes that she gained appropriate weight during pregnancy and has since lost that weight. However, endorses thyroid issues so her weight tends to fluctuate a lot. Patient states she was eating very well since giving birth. Typically has three meals a day plus snacks but over the past week her appetite has been very poor and she would sometimes go an entire day with eating. She is currently hungry but is NPO for cholecystectomy later today. She is agreeable to try nutrition supplements once diet advanced to support intake.     Medications reviewed and include: -  Labs reviewed:  K+ 3.0   NUTRITION - FOCUSED PHYSICAL EXAM:  Flowsheet Row Most Recent Value  Orbital Region No depletion  Upper Arm Region No depletion  Thoracic and Lumbar Region No depletion  Buccal Region No depletion  Temple Region No depletion  Clavicle Bone Region Mild depletion  Clavicle and Acromion Bone Region Mild depletion  Scapular Bone Region Unable to assess  Dorsal Hand  No depletion  Patellar Region No depletion  Anterior Thigh Region No depletion  Posterior Calf Region No depletion  Edema (RD Assessment) None  Hair Reviewed  Eyes Reviewed  Mouth Reviewed  Skin Reviewed  Nails Reviewed       Diet Order:   Diet Order             Diet NPO time specified  Diet effective midnight                   EDUCATION NEEDS:  Education needs have been addressed  Skin:  Skin Assessment: Reviewed RN Assessment  Last BM:  12/9  Height:  Ht Readings from Last 1 Encounters:  05/31/22 5' 2.5" (1.588 m)   Weight:  Wt Readings from Last 1 Encounters:  05/31/22 57.1 kg    BMI:  Body mass index is 22.66 kg/m.  Estimated Nutritional Needs:  Kcal:  1700-1900 kcals Protein:  65-75 grams Fluid:  >/= 1.7L    Shelle Iron RD, LDN For contact information, refer to [facility].

## 2022-06-01 NOTE — Progress Notes (Signed)
  Transition of Care [facility]) Screening Note   Patient Details  Name: Judy [name] Date of Birth: 03/06/1996   Transition of Care [facility]) CM/SW Contact:    Amada Jupiter, LCSW Phone Number: 06/01/2022, 10:59 AM    Transition of Care Department [facility]) has reviewed patient and no TOC needs have been identified at this time. We will continue to monitor patient advancement through interdisciplinary progression rounds. If new patient transition needs arise, please place a TOC consult.

## 2022-06-02 ENCOUNTER — Encounter (HOSPITAL_COMMUNITY): Payer: Self-pay | Admitting: General Surgery

## 2022-06-02 DIAGNOSIS — Z9049 Acquired absence of other specified parts of digestive tract: Secondary | ICD-10-CM

## 2022-06-02 LAB — HEPATIC FUNCTION PANEL
ALT: 177 U/L — ABNORMAL HIGH (ref 0–44)
AST: 94 U/L — ABNORMAL HIGH (ref 15–41)
Albumin: 3.7 g/dL (ref 3.5–5.0)
Alkaline Phosphatase: 80 U/L (ref 38–126)
Bilirubin, Direct: 0.1 mg/dL (ref 0.0–0.2)
Indirect Bilirubin: 0.7 mg/dL (ref 0.3–0.9)
Total Bilirubin: 0.8 mg/dL (ref 0.3–1.2)
Total Protein: 7.8 g/dL (ref 6.5–8.1)

## 2022-06-02 LAB — T3, FREE: T3, Free: 2.6 pg/mL (ref 2.0–4.4)

## 2022-06-02 MED ORDER — TRAMADOL HCL 50 MG PO TABS
50.0000 mg | ORAL_TABLET | Freq: Four times a day (QID) | ORAL | Status: DC | PRN
  Administered 2022-06-02: 50 mg via ORAL
  Filled 2022-06-02: qty 1

## 2022-06-02 MED ORDER — IBUPROFEN 400 MG PO TABS
600.0000 mg | ORAL_TABLET | Freq: Three times a day (TID) | ORAL | Status: DC
  Administered 2022-06-02: 600 mg via ORAL
  Filled 2022-06-02: qty 1

## 2022-06-02 MED ORDER — DOCUSATE SODIUM 100 MG PO CAPS
100.0000 mg | ORAL_CAPSULE | Freq: Two times a day (BID) | ORAL | Status: DC
  Administered 2022-06-02: 100 mg via ORAL
  Filled 2022-06-02 (×2): qty 1

## 2022-06-02 MED ORDER — TRAMADOL HCL 50 MG PO TABS: 50.0000 mg | ORAL_TABLET | Freq: Four times a day (QID) | ORAL | 0 refills | Status: AC | PRN

## 2022-06-02 MED ORDER — IBUPROFEN 600 MG PO TABS: 600.0000 mg | ORAL_TABLET | Freq: Three times a day (TID) | ORAL | 0 refills | Status: AC

## 2022-06-02 MED ORDER — ENSURE ENLIVE PO LIQD
237.0000 mL | Freq: Two times a day (BID) | ORAL | Status: DC
  Administered 2022-06-02 (×2): 237 mL via ORAL

## 2022-06-02 MED ORDER — POLYETHYLENE GLYCOL 3350 17 G PO PACK
17.0000 g | PACK | Freq: Every day | ORAL | Status: DC
  Administered 2022-06-02: 17 g via ORAL
  Filled 2022-06-02: qty 1

## 2022-06-02 MED ORDER — PANTOPRAZOLE SODIUM 40 MG PO TBEC: 40.0000 mg | DELAYED_RELEASE_TABLET | Freq: Every day | ORAL | Status: DC

## 2022-06-02 NOTE — Progress Notes (Signed)
PHARMACIST - PHYSICIAN COMMUNICATION  DR:   Dwain Sarna  CONCERNING: IV to Oral Route Change Policy  RECOMMENDATION: This patient is receiving pantoprazole by the intravenous route.  Based on criteria approved by the Pharmacy and Therapeutics Committee, the intravenous medication(s) is/are being converted to the equivalent oral dose form(s).   DESCRIPTION: These criteria include: The patient is eating (either orally or via tube) and/or has been taking other orally administered medications for a least 24 hours The patient has no evidence of active gastrointestinal bleeding or impaired GI absorption (gastrectomy, short bowel, patient on TNA or NPO).  If you have questions about this conversion, please contact the Pharmacy Department  []   [phone] )  ( [phone] []   [phone] )  [facility] []   [phone] )  [facility] []   [phone] )  [facility] [x]   [phone] )  [facility]   [place], [facility] 06/02/2022 7:14 AM

## 2022-06-02 NOTE — Progress Notes (Signed)
Discharge package printed and instructions given to patient. Patient verbalizes understanding. 

## 2022-06-02 NOTE — Progress Notes (Signed)
Progress Note  1 Day Post-Op  Subjective: Pt reports she is still having significant pain this AM in RUQ and back. Some n/v overnight but hoping she can tolerate breakfast this AM. She works at the [employer] center. Discussed post-op care.   Objective: Vital signs in last 24 hours: Temp:  [97.5 F (36.4 C)-98.7 F (37.1 C)] 98.7 F (37.1 C) (12/12 0547) Pulse Rate:  [51-93] 51 (12/12 0547) Resp:  [10-19] 16 (12/12 0547) BP: (109-140)/(57-75) 109/70 (12/12 0547) SpO2:  [98 %-100 %] 100 % (12/12 0547) Last BM Date : 05/30/22  Intake/Output from previous day: 12/11 0701 - 12/12 0700 In: 2396.1 [P.O.:360; I.V.:2016.1; IV Piggyback:20] Out: 1705 [Urine:1500; Blood:205] Intake/Output this shift: No intake/output data recorded.  PE: General: pleasant, WD, thin female who is laying in bed in NAD HEENT: sclera anicteric Heart: regular, rate, and rhythm.   Lungs:  Respiratory effort nonlabored Abd: soft, appropriately ttp, mild distention, incisions c/d/i MS: all 4 extremities are symmetrical with no cyanosis, clubbing, or edema. Psych: A&Ox3 with an appropriate affect.    Lab Results:  Recent Labs    05/31/22 0956 06/01/22 0316  WBC 17.4* 11.3*  HGB 13.5 11.2*  HCT 42.4 35.4*  PLT 295 227   BMET Recent Labs    05/31/22 0956 06/01/22 0316  NA 141 140  K 3.3* 3.0*  CL 108 111  CO2 25 21*  GLUCOSE 122* 77  BUN 9 7  CREATININE 0.80 0.75  CALCIUM 9.4 7.7*   PT/INR No results for input(s): "LABPROT", "INR" in the last 72 hours. CMP     Component Value Date/Time   NA 140 06/01/2022 0316   NA 138 10/24/2021 1130   K 3.0 (L) 06/01/2022 0316   CL 111 06/01/2022 0316   CO2 21 (L) 06/01/2022 0316   GLUCOSE 77 06/01/2022 0316   BUN 7 06/01/2022 0316   BUN 8 10/24/2021 1130   CREATININE 0.75 06/01/2022 0316   CREATININE 0.74 01/24/2014 1726   CALCIUM 7.7 (L) 06/01/2022 0316   PROT 7.8 06/02/2022 0319   PROT 7.6 10/24/2021 1130   ALBUMIN 3.7  06/02/2022 0319   ALBUMIN 4.4 10/24/2021 1130   AST 94 (H) 06/02/2022 0319   ALT 177 (H) 06/02/2022 0319   ALKPHOS 80 06/02/2022 0319   BILITOT 0.8 06/02/2022 0319   BILITOT 0.4 10/24/2021 1130   GFRNONAA >60 06/01/2022 0316   GFRAA >60 05/15/2019 1859   Lipase     Component Value Date/Time   LIPASE 45 05/31/2022 0956       Studies/Results: CT ABDOMEN PELVIS W CONTRAST  Result Date: 05/31/2022 CLINICAL DATA:  Epigastric abdominal pain, nausea EXAM: CT ABDOMEN AND PELVIS WITH CONTRAST TECHNIQUE: Multidetector CT imaging of the abdomen and pelvis was performed using the standard protocol following bolus administration of intravenous contrast. RADIATION DOSE REDUCTION: This exam was performed according to the departmental dose-optimization program which includes automated exposure control, adjustment of the mA and/or kV according to patient size and/or use of iterative reconstruction technique. CONTRAST:  OMNIPAQUE IOHEXOL 300 MG/ML  SOLN COMPARISON:  None Available. FINDINGS: Lower chest: No acute abnormality. Hepatobiliary: No focal liver abnormality is seen. Gallbladder wall thickening and pericholecystic fluid (series 2, image 26). Intrahepatic biliary ductal dilatation, the central common bile duct near the upper limit of normal in caliber at 0.7 cm and tapering smoothly to the ampulla without visualized calculus or other obstruction (series 4, image 47). Pancreas: Unremarkable. No pancreatic ductal dilatation or surrounding inflammatory changes.  Spleen: Normal in size without significant abnormality. Adrenals/Urinary Tract: Adrenal glands are unremarkable. Kidneys are normal, without renal calculi, solid lesion, or hydronephrosis. Bladder is unremarkable. Stomach/Bowel: Stomach is within normal limits. Appendix appears normal. No evidence of bowel wall thickening, distention, or inflammatory changes. Vascular/Lymphatic: No significant vascular findings are present. No enlarged  abdominal or pelvic lymph nodes. Reproductive: No mass or other significant abnormality. Simple appearing cyst of the right ovary measuring 3.3 x 2.3 cm, benign, for which no further follow-up or characterization is required (series 2, image 69). No follow-up imaging recommended. Note: This recommendation does not apply to premenarchal patients and to those with increased risk (genetic, family history, elevated tumor markers or other high-risk factors) of ovarian cancer. Reference: JACR 2020 Feb; 17(2):248-254 Other: No abdominal wall hernia or abnormality. No ascites. Musculoskeletal: No acute or significant osseous findings. IMPRESSION: 1. Gallbladder wall thickening and pericholecystic fluid, concerning for acute cholecystitis. 2. Intrahepatic biliary ductal dilatation, the central common bile duct near the upper limit of normal in caliber at 0.7 cm and tapering smoothly to the ampulla without visualized calculus or other obstruction. 3. Findings are concerning for acute cholecystitis. Consider right upper quadrant ultrasound, nuclear scintigraphic HIDA, and or MRI/MRCP to further evaluate. Electronically Signed   By: Delanna Ahmadi M.D.   On: 05/31/2022 13:13   DG Chest Port 1 View  Result Date: 05/31/2022 CLINICAL DATA:  Shortness of breath. EXAM: PORTABLE CHEST 1 VIEW COMPARISON:  Chest XR, 04/28/2007. FINDINGS: Cardiomediastinal silhouette is within normal limits. Lungs are well inflated. No focal consolidation or mass. No pleural effusion or pneumothorax. No acute displaced fracture. IMPRESSION: Normal chest. Electronically Signed   By: Michaelle Birks M.D.   On: 05/31/2022 09:52    Anti-infectives: Anti-infectives (From admission, onward)    Start     Dose/Rate Route Frequency Ordered Stop   05/31/22 1700  cefTRIAXone (ROCEPHIN) 2 g in sodium chloride 0.9 % 100 mL IVPB  Status:  Discontinued        2 g 200 mL/hr over 30 Minutes Intravenous Daily 05/31/22 1550 06/01/22 1632   05/31/22 1345   ciprofloxacin (CIPRO) IVPB 400 mg       See Hyperspace for full Linked Orders Report.   400 mg 200 mL/hr over 60 Minutes Intravenous  Once 05/31/22 1339 05/31/22 1540   05/31/22 1345  metroNIDAZOLE (FLAGYL) IVPB 500 mg       See Hyperspace for full Linked Orders Report.   500 mg 100 mL/hr over 60 Minutes Intravenous  Once 05/31/22 1339 05/31/22 1540        Assessment/Plan  Acute cholecystitis  POD1 s/p laparoscopic cholecystectomy  - LFTs downtrending this AM, Tbili normal - pt reports she is still having some uncontrolled abdominal pain - added scheduled motrin this AM and prn tramadol - mobilize  - added colace and miralax for constipation - diet as tolerated - probable discharge this afternoon if pain better and tolerating diet this AM  FEN: reg diet, IVF @50  cc/h, ensure VTE: LMWH ID: no further abx needed   Hyperthyroidism - continue home meds, follow up with endocrinology as planned  LOS: 1 day     Norm Parcel, [facility] 06/02/2022, 9:30 AM Please see Amion for pager number during day hours 7:00am-4:30pm

## 2022-06-02 NOTE — Discharge Summary (Signed)
[facility] Discharge Summary   Patient ID: Judy [name] MRN: [id number] DOB/AGE: 1996-03-26 26 y.o.  Admit date: 05/31/2022 Discharge date: 06/02/2022  Admitting Diagnosis: Acute cholecystitis   Discharge Diagnosis S/P laparoscopic cholecystectomy   Consultants None   Imaging: No results found.  Procedures Dr. Emelia Loron (06/01/22) - Laparoscopic Cholecystectomy   Hospital Course:  Patient is a 26 year old female who presented to the ED with abdominal pain.  Workup showed acute cholecystitis.  Patient was admitted and underwent procedure listed above.  Tolerated procedure well and was transferred to the floor.  Diet was advanced as tolerated.  On POD1, the patient was voiding well, tolerating diet, ambulating well, pain well controlled, vital signs stable, incisions c/d/i and felt stable for discharge home.  Patient will follow up in our office in 3-4 weeks and knows to call with questions or concerns. She will call to confirm appointment date/time.    I or a member of my team have reviewed this patient in the Controlled Substance Database.   Allergies as of 06/02/2022       Reactions   Penicillins Hives   Latex Hives, Dermatitis   Tape Hives, Rash        Medication List     TAKE these medications    acetaminophen 325 MG tablet Commonly known as: Tylenol Take 2 tablets (650 mg total) by mouth every 4 (four) hours as needed (for pain scale < 4). What changed: reasons to take this   ibuprofen 600 MG tablet Commonly known as: ADVIL Take 1 tablet (600 mg total) by mouth 3 (three) times daily.   methimazole 5 MG tablet Commonly known as: TAPAZOLE Take 1 tablet (5 mg total) by mouth 2 (two) times daily.   traMADol 50 MG tablet Commonly known as: ULTRAM Take 1 tablet (50 mg total) by mouth every 6 (six) hours as needed for moderate pain.          Follow-up Information     [facility], [facility]. Go on 06/30/2022.   Specialty:  General Surgery Why: 8:45 AM. Please arrive 30 min prior to appointment time. Contact information: [address] [place] [address] [phone]                 Signed: Juliet Rude , [facility] 06/02/2022, 1:47 PM Please see Amion for pager number during day hours 7:00am-4:30pm

## 2022-06-02 NOTE — Plan of Care (Signed)
  Problem: Activity: Goal: Risk for activity intolerance will decrease Outcome: Progressing   Problem: Pain Managment: Goal: General experience of comfort will improve Outcome: Progressing   Problem: Safety: Goal: Ability to remain free from injury will improve Outcome: Progressing   

## 2022-06-03 LAB — SURGICAL PATHOLOGY

## 2022-07-02 ENCOUNTER — Other Ambulatory Visit (HOSPITAL_COMMUNITY)
Admission: RE | Admit: 2022-07-02 | Discharge: 2022-07-02 | Disposition: A | Discharge status: Home / Self Care | Payer: Medicaid Other | Source: Ambulatory Visit | Attending: Obstetrics and Gynecology | Admitting: Obstetrics and Gynecology

## 2022-07-02 ENCOUNTER — Ambulatory Visit (INDEPENDENT_AMBULATORY_CARE_PROVIDER_SITE_OTHER): Payer: Medicaid Other | Admitting: General Practice

## 2022-07-02 VITALS — BP 106/67 | HR 62 | Ht 62.5 in | Wt 120.0 lb

## 2022-07-02 DIAGNOSIS — N898 Other specified noninflammatory disorders of vagina: Secondary | ICD-10-CM | POA: Diagnosis not present

## 2022-07-02 DIAGNOSIS — Z113 Encounter for screening for infections with a predominantly sexual mode of transmission: Secondary | ICD-10-CM

## 2022-07-02 DIAGNOSIS — N912 Amenorrhea, unspecified: Secondary | ICD-10-CM

## 2022-07-02 LAB — POCT URINE PREGNANCY: Preg Test, Ur: NEGATIVE

## 2022-07-02 NOTE — Progress Notes (Signed)
SUBJECTIVE:  27 y.o. female presents for STD testing. Denies abnormal vaginal discharge, bleeding or significant pelvic pain or fever. No UTI symptoms. Denies history of known exposure to STD. Pt requesting UPT.   Patient's last menstrual period was 08/18/2021 (exact date).  OBJECTIVE:  She appears well, afebrile. Urine dipstick: not done.  ASSESSMENT:  Vaginal Discharge  Vaginal Odor   PLAN:  GC, chlamydia, trichomonas, BVAG, CVAG probe sent to lab. Treatment: To be determined once lab results are received ROV prn if symptoms persist or worsen.

## 2022-07-03 LAB — HEPATITIS C ANTIBODY: Hep C Virus Ab: NONREACTIVE

## 2022-07-03 LAB — HIV ANTIBODY (ROUTINE TESTING W REFLEX): HIV Screen 4th Generation wRfx: NONREACTIVE

## 2022-07-03 LAB — CERVICOVAGINAL ANCILLARY ONLY
Chlamydia: NEGATIVE
Comment: NEGATIVE
Comment: NEGATIVE
Comment: NORMAL
Neisseria Gonorrhea: NEGATIVE
Trichomonas: NEGATIVE

## 2022-07-03 LAB — RPR: RPR Ser Ql: NONREACTIVE

## 2022-07-03 LAB — HEPATITIS B SURFACE ANTIGEN: Hepatitis B Surface Ag: NEGATIVE

## 2022-07-10 ENCOUNTER — Other Ambulatory Visit: Payer: Self-pay | Admitting: Internal Medicine

## 2022-07-13 LAB — COMPLETE METABOLIC PANEL WITH GFR
AG Ratio: 1.3 (calc) (ref 1.0–2.5)
ALT: 9 U/L (ref 6–29)
AST: 14 U/L (ref 10–30)
Albumin: 4.3 g/dL (ref 3.6–5.1)
Alkaline phosphatase (APISO): 45 U/L (ref 31–125)
BUN/Creatinine Ratio: 8 (calc) (ref 6–22)
BUN: 5 mg/dL — ABNORMAL LOW (ref 7–25)
CO2: 22 mmol/L (ref 20–32)
Calcium: 9.5 mg/dL (ref 8.6–10.2)
Chloride: 106 mmol/L (ref 98–110)
Creat: 0.66 mg/dL (ref 0.50–0.96)
Globulin: 3.2 g/dL (calc) (ref 1.9–3.7)
Glucose, Bld: 77 mg/dL (ref 65–99)
Potassium: 3.7 mmol/L (ref 3.5–5.3)
Sodium: 140 mmol/L (ref 135–146)
Total Bilirubin: 0.7 mg/dL (ref 0.2–1.2)
Total Protein: 7.5 g/dL (ref 6.1–8.1)
eGFR: 123 mL/min/{1.73_m2} (ref >=60)

## 2022-07-13 LAB — CBC
HCT: 37 % (ref 35.0–45.0)
Hemoglobin: 12.5 g/dL (ref 11.7–15.5)
MCH: 26.6 pg — ABNORMAL LOW (ref 27.0–33.0)
MCHC: 33.8 g/dL (ref 32.0–36.0)
MCV: 78.7 fL — ABNORMAL LOW (ref 80.0–100.0)
MPV: 10.8 fL (ref 7.5–12.5)
Platelets: 330 10*3/uL (ref 140–400)
RBC: 4.7 10*6/uL (ref 3.80–5.10)
RDW: 15.2 % — ABNORMAL HIGH (ref 11.0–15.0)
WBC: 5 10*3/uL (ref 3.8–10.8)

## 2022-07-13 LAB — LIPID PANEL
Cholesterol: 180 mg/dL (ref <200)
HDL: 55 mg/dL (ref >=50)
LDL Cholesterol (Calc): 111 mg/dL (calc) — ABNORMAL HIGH
Non-HDL Cholesterol (Calc): 125 mg/dL (calc) (ref <130)
Total CHOL/HDL Ratio: 3.3 (calc) (ref <5.0)
Triglycerides: 59 mg/dL (ref <150)

## 2022-07-13 LAB — THYROID ANTIBODIES
Thyroglobulin Ab: 7 IU/mL — ABNORMAL HIGH (ref <=1)
Thyroperoxidase Ab SerPl-aCnc: 1 IU/mL (ref <9)

## 2022-07-13 LAB — T4
Free Thyroxine Index: 3 (ref 1.4–3.8)
T4, Total: 9.3 ug/dL (ref 5.1–11.9)

## 2022-07-13 LAB — TSH: TSH: 0.21 mIU/L — ABNORMAL LOW

## 2022-07-13 LAB — T3 UPTAKE: T3 Uptake: 32 % (ref 22–35)

## 2022-07-13 LAB — T3: T3, Total: 109 ng/dL (ref 76–181)

## 2022-08-06 ENCOUNTER — Ambulatory Visit: Payer: Medicaid Other | Admitting: Internal Medicine

## 2022-08-06 NOTE — Progress Notes (Deleted)
Name: Judy [name]  MRN/ DOB: [id number], 1996/06/11    Age/ Sex: 27 y.o., female    PCP: Pa, [place]   Reason for Endocrinology Evaluation: Hyperthyroidism     Date of Initial Endocrinology Evaluation: 08/06/2022     HPI: Judy [name] is a 27 y.o. female with a past medical history of hyperthyroidism. The patient presented for initial endocrinology clinic visit on 08/06/2022 for consultative assistance with her Hyperthyroidism.   Patient has been diagnosed with Hyperthyroidism 10/2021 with suppressed TSH at <0.005 u IU/mL, with elevated total T4 at 23.2 UG/DL   She is s/p cholecystitis 05/31/2022 She is s/p vaginal delivery 04/24/2022   The patient was started on methimazole in 02/2022  HISTORY:  Past Medical History:  Past Medical History:  Diagnosis Date   Anxiety    Asthma    History of preterm delivery    Hyperthyroidism    Post partum depression    Vaginal Pap smear, abnormal    Yeast infection of the vagina    Past Surgical History:  Past Surgical History:  Procedure Laterality Date   CHOLECYSTECTOMY N/A 06/01/2022   Procedure: LAPAROSCOPIC CHOLECYSTECTOMY;  Surgeon: Rolm Bookbinder, MD;  Location: [facility];  Service: General;  Laterality: N/A;   NO PAST SURGERIES      Social History:  reports that she has never smoked. She has never used smokeless tobacco. She reports that she does not currently use alcohol. She reports that she does not currently use drugs. Family History: family history includes Anxiety disorder in her mother; Asthma in her father and mother; Brain cancer in her mother; Breast cancer in her mother; Diabetes in her mother; Heart disease in her paternal grandfather and paternal grandmother; Hypertension in her maternal grandfather, mother, and paternal aunt; Stomach cancer in her mother.   HOME MEDICATIONS: Allergies as of 08/06/2022       Reactions   Penicillins Hives   Latex Hives, Dermatitis   Tape Hives, Rash         Medication List        Accurate as of August 06, 2022  7:36 AM. If you have any questions, ask your nurse or doctor.          acetaminophen 325 MG tablet Commonly known as: Tylenol Take 2 tablets (650 mg total) by mouth every 4 (four) hours as needed (for pain scale < 4).   ibuprofen 600 MG tablet Commonly known as: ADVIL Take 1 tablet (600 mg total) by mouth 3 (three) times daily.   methimazole 5 MG tablet Commonly known as: TAPAZOLE Take 1 tablet (5 mg total) by mouth 2 (two) times daily.   traMADol 50 MG tablet Commonly known as: ULTRAM Take 1 tablet (50 mg total) by mouth every 6 (six) hours as needed for moderate pain.          REVIEW OF SYSTEMS: A comprehensive ROS was conducted with the patient and is negative except as per HPI and below:  ROS     OBJECTIVE:  VS: There were no vitals taken for this visit.   Wt Readings from Last 3 Encounters:  07/02/22 120 lb (54.4 kg)  05/31/22 125 lb 14.1 oz (57.1 kg)  04/08/22 146 lb (66.2 kg)     EXAM: General: Pt appears well and is in NAD  Eyes: External eye exam normal without stare, lid lag or exophthalmos.  EOM intact.  PERRL.  Neck: General: Supple without adenopathy. Thyroid: Thyroid size normal.  No goiter or nodules appreciated. No thyroid bruit.  Lungs: Clear with good BS bilat with no rales, rhonchi, or wheezes  Heart: Auscultation: RRR.  Abdomen: Normoactive bowel sounds, soft, nontender, without masses or organomegaly palpable  Extremities:  BL LE: No pretibial edema normal ROM and strength.  Mental Status: Judgment, insight: Intact Orientation: Oriented to time, place, and person Mood and affect: No depression, anxiety, or agitation     DATA REVIEWED: ***    ASSESSMENT/PLAN/RECOMMENDATIONS:   Hyperthyroidism:    Medications :  Signed electronically by: Mack Guise, MD  [facility] Endocrinology  [address] [place],  [place] [address] Phone: [phone] FAX: [phone]   CC: Pa, [place] [address] [place] [address] Phone: [phone] Fax: [phone]   Return to Endocrinology clinic as below: Future Appointments  Date Time Provider [place]  08/06/2022 11:10 AM Ayren Zumbro, Melanie Crazier, MD [facility] None

## 2022-12-21 ENCOUNTER — Ambulatory Visit: Payer: MEDICAID

## 2023-02-17 ENCOUNTER — Ambulatory Visit: Payer: Medicaid Other | Admitting: Internal Medicine

## 2023-04-19 ENCOUNTER — Encounter: Payer: Self-pay | Admitting: Emergency Medicine

## 2023-04-19 ENCOUNTER — Ambulatory Visit: Payer: MEDICAID | Admitting: Emergency Medicine

## 2023-04-19 ENCOUNTER — Other Ambulatory Visit (HOSPITAL_COMMUNITY)
Admission: RE | Admit: 2023-04-19 | Discharge: 2023-04-19 | Disposition: A | Discharge status: Home / Self Care | Payer: MEDICAID | Source: Ambulatory Visit | Attending: Obstetrics and Gynecology | Admitting: Obstetrics and Gynecology

## 2023-04-19 VITALS — BP 106/60 | HR 91 | Ht 63.0 in | Wt 112.9 lb

## 2023-04-19 DIAGNOSIS — B3731 Acute candidiasis of vulva and vagina: Secondary | ICD-10-CM | POA: Diagnosis not present

## 2023-04-19 DIAGNOSIS — Z113 Encounter for screening for infections with a predominantly sexual mode of transmission: Secondary | ICD-10-CM | POA: Insufficient documentation

## 2023-04-19 DIAGNOSIS — B9689 Other specified bacterial agents as the cause of diseases classified elsewhere: Secondary | ICD-10-CM | POA: Insufficient documentation

## 2023-04-19 DIAGNOSIS — N760 Acute vaginitis: Secondary | ICD-10-CM | POA: Insufficient documentation

## 2023-04-19 NOTE — Progress Notes (Unsigned)
SUBJECTIVE:  27 y.o. female desires STD screen and blood work. Denies abnormal discharge, vaginal bleeding or significant pelvic pain or fever. No UTI symptoms. Denies history of known exposure to STD.  Also requesting refill for thyroid medication. Reports that  she has had palpations and chest discomfort. States that she is unable to meet with her PCP until Feb 2025. Staff message sent to provider for refill request.  No LMP recorded.  OBJECTIVE:  She appears well, afebrile. Urine dipstick: not done.  ASSESSMENT:  Vaginal Discharge  Vaginal Odor   PLAN:  GC, chlamydia, trichomonas, BVAG, CVAG probe sent to lab. Treatment: To be determined once lab results are received ROV prn if symptoms persist or worsen.

## 2023-04-20 LAB — HEPATITIS C ANTIBODY: Hep C Virus Ab: NONREACTIVE

## 2023-04-20 LAB — CERVICOVAGINAL ANCILLARY ONLY
Bacterial Vaginitis (gardnerella): POSITIVE — AB
Candida Glabrata: NEGATIVE
Candida Vaginitis: POSITIVE — AB
Chlamydia: NEGATIVE
Comment: NEGATIVE
Comment: NEGATIVE
Comment: NEGATIVE
Comment: NEGATIVE
Comment: NEGATIVE
Comment: NORMAL
Neisseria Gonorrhea: NEGATIVE
Trichomonas: NEGATIVE

## 2023-04-20 LAB — RPR: RPR Ser Ql: NONREACTIVE

## 2023-04-20 LAB — HIV ANTIBODY (ROUTINE TESTING W REFLEX): HIV Screen 4th Generation wRfx: NONREACTIVE

## 2023-04-20 LAB — HEPATITIS B SURFACE ANTIGEN: Hepatitis B Surface Ag: NEGATIVE

## 2023-04-20 MED ORDER — METRONIDAZOLE 500 MG PO TABS: 500.0000 mg | ORAL_TABLET | Freq: Two times a day (BID) | ORAL | 0 refills | Status: DC

## 2023-04-20 MED ORDER — FLUCONAZOLE 150 MG PO TABS: 150.0000 mg | ORAL_TABLET | Freq: Once | ORAL | 0 refills | Status: AC

## 2023-06-08 ENCOUNTER — Ambulatory Visit: Payer: MEDICAID | Admitting: Obstetrics and Gynecology

## 2023-07-27 ENCOUNTER — Ambulatory Visit: Payer: MEDICAID | Admitting: Obstetrics and Gynecology

## 2023-11-04 ENCOUNTER — Other Ambulatory Visit (HOSPITAL_COMMUNITY)
Admission: RE | Admit: 2023-11-04 | Discharge: 2023-11-04 | Disposition: A | Discharge status: Home / Self Care | Payer: MEDICAID | Source: Ambulatory Visit | Attending: Obstetrics | Admitting: Obstetrics

## 2023-11-04 ENCOUNTER — Ambulatory Visit: Payer: MEDICAID | Admitting: Obstetrics

## 2023-11-04 ENCOUNTER — Encounter: Payer: Self-pay | Admitting: Obstetrics

## 2023-11-04 VITALS — BP 108/49 | HR 84 | Ht 62.5 in | Wt 122.4 lb

## 2023-11-04 DIAGNOSIS — Z01419 Encounter for gynecological examination (general) (routine) without abnormal findings: Secondary | ICD-10-CM | POA: Diagnosis not present

## 2023-11-04 DIAGNOSIS — N898 Other specified noninflammatory disorders of vagina: Secondary | ICD-10-CM | POA: Insufficient documentation

## 2023-11-04 DIAGNOSIS — Z113 Encounter for screening for infections with a predominantly sexual mode of transmission: Secondary | ICD-10-CM

## 2023-11-04 NOTE — Progress Notes (Unsigned)
 Pt presents for annual. Pt states that she has a cyst on her left ovary. Pt states that she was having pain but not so much now.

## 2023-11-04 NOTE — Progress Notes (Unsigned)
 Subjective:        Judy [name] is a 28 y.o. female here for a routine exam.  Current complaints: Vaginal discharge.    Personal health questionnaire:  Is patient [name], have a family history of breast and/or ovarian cancer: yes Is there a family history of uterine cancer diagnosed at age < 65, gastrointestinal cancer, urinary tract cancer, family member who is a Personnel officer syndrome-associated carrier: yes Is the patient overweight and hypertensive, family history of diabetes, personal history of gestational diabetes, preeclampsia or PCOS: no Is patient over 5, have PCOS,  family history of premature CHD under age 26, diabetes, smoke, have hypertension or peripheral artery disease:  no At any time, has a partner hit, kicked or otherwise hurt or frightened you?: no Over the past 2 weeks, have you felt down, depressed or hopeless?: no Over the past 2 weeks, have you felt little interest or pleasure in doing things?:no   Gynecologic History Patient's last menstrual period was 10/13/2023 (exact date). Contraception: {method:5051} Last Pap: ***. Results were: {norm/abn:16337} Last mammogram: ***. Results were: {norm/abn:16337}  Obstetric History OB History  Gravida Para Term Preterm AB Living  8 7 3 4 1 7   SAB IAB Ectopic Multiple Live Births  1 0 0 0 7    # Outcome Date GA Lbr Len/2nd Weight Sex Type Anes PTL Lv  8 Term 04/24/22 [redacted]w[redacted]d 00:19 / 00:32 5 lb 4.7 oz (2.4 kg) F Vag-Spont None  LIV  7 Term 02/22/21 [redacted]w[redacted]d   M Vag-Spont   LIV  6 Preterm 12/08/19 [redacted]w[redacted]d   F Vag-Spont   LIV  5 Preterm 01/05/19 [redacted]w[redacted]d 04:52 / 00:14 5 lb 1 oz (2.296 kg) M Vag-Spont None  LIV  4 SAB 2018          3 Preterm 04/02/16 [redacted]w[redacted]d 07:50 / 00:09 5 lb 13 oz (2.635 kg) F Vag-Spont EPI  LIV     Birth Comments: none  2 Preterm 08/10/12 [redacted]w[redacted]d 09:42 / 00:24 6 lb 4.7 oz (2.855 kg) M Vag-Spont EPI  LIV  1 Term 09/12/11 [redacted]w[redacted]d 08:02 / 00:30 6 lb 1.4 oz (2.761 kg) F Vag-Spont EPI  LIV     Birth  Comments: WNL    Past Medical History:  Diagnosis Date   Anxiety    Asthma    History of preterm delivery    Hyperthyroidism    Post partum depression    Vaginal Pap smear, abnormal    Yeast infection of the vagina     Past Surgical History:  Procedure Laterality Date   CHOLECYSTECTOMY N/A 06/01/2022   Procedure: LAPAROSCOPIC CHOLECYSTECTOMY;  Surgeon: Enid Harry, MD;  Location: [facility];  Service: General;  Laterality: N/A;   NO PAST SURGERIES       Current Outpatient Medications:    acetaminophen  (TYLENOL ) 325 MG tablet, Take 2 tablets (650 mg total) by mouth every 4 (four) hours as needed (for pain scale < 4). (Patient not taking: Reported on 07/02/2022), Disp: 30 tablet, Rfl: 0   ibuprofen  (ADVIL ) 600 MG tablet, Take 1 tablet (600 mg total) by mouth 3 (three) times daily. (Patient not taking: Reported on 11/04/2023), Disp: 30 tablet, Rfl: 0   methimazole  (TAPAZOLE ) 5 MG tablet, Take 1 tablet (5 mg total) by mouth 2 (two) times daily. (Patient not taking: Reported on 07/02/2022), Disp: 60 tablet, Rfl: 2   metroNIDAZOLE  (FLAGYL ) 500 MG tablet, Take 1 tablet (500 mg total) by mouth 2 (two) times daily. (Patient not taking: Reported  on 11/04/2023), Disp: 14 tablet, Rfl: 0   traMADol  (ULTRAM ) 50 MG tablet, Take 1 tablet (50 mg total) by mouth every 6 (six) hours as needed for moderate pain. (Patient not taking: Reported on 11/04/2023), Disp: 15 tablet, Rfl: 0 Allergies  Allergen Reactions   Penicillins Hives   Latex Hives and Dermatitis   Tape Hives and Rash    Social History   Tobacco Use   Smoking status: Never   Smokeless tobacco: Never  Substance Use Topics   Alcohol use: Not Currently    Family History  Problem Relation Age of Onset   Hypertension Mother    Diabetes Mother    Anxiety disorder Mother    Asthma Mother    Breast cancer Mother    Stomach cancer Mother    Brain cancer Mother    Asthma Father    Hypertension Maternal Grandfather    Heart disease  Paternal Grandmother    Heart disease Paternal Grandfather    Hypertension Paternal Aunt       Review of Systems  Constitutional: negative for fatigue and weight loss Respiratory: negative for cough and wheezing Cardiovascular: negative for chest pain, fatigue and palpitations Gastrointestinal: negative for abdominal pain and change in bowel habits Musculoskeletal:negative for myalgias Neurological: negative for gait problems and tremors Behavioral/Psych: negative for abusive relationship, depression Endocrine: negative for temperature intolerance    Genitourinary:negative for abnormal menstrual periods, genital lesions, hot flashes, sexual problems and vaginal discharge Integument/breast: negative for breast lump, breast tenderness, nipple discharge and skin lesion(s)    Objective:       BP (!) 108/49   Pulse 84   Ht 5' 2.5" (1.588 m)   Wt 122 lb 6.4 oz (55.5 kg)   LMP 10/13/2023 (Exact Date)   BMI 22.03 kg/m  General:   alert  Skin:   no rash or abnormalities  Lungs:   clear to auscultation bilaterally  Heart:   regular rate and rhythm, S1, S2 normal, no murmur, click, rub or gallop  Breasts:   normal without suspicious masses, skin or nipple changes or axillary nodes  Abdomen:  normal findings: no organomegaly, soft, non-tender and no hernia  Pelvis:  External genitalia: normal general appearance Urinary system: urethral meatus normal and bladder without fullness, nontender Vaginal: normal without tenderness, induration or masses Cervix: normal appearance Adnexa: normal bimanual exam Uterus: anteverted and non-tender, normal size   Lab Review Urine pregnancy test Labs reviewed yes Radiologic studies reviewed no  I have spent a total of 20 minutes of face-to-face and non-face-to-face time, excluding clinical staff time, reviewing notes and preparing to see patient, ordering tests and/or medications, and counseling the patient.   Assessment:    Healthy female  exam.    Plan:    {plan:19193}   No orders of the defined types were placed in this encounter.  Orders Placed This Encounter  Procedures   RPR   HIV antibody (with reflex)   Hepatitis C Antibody   Hepatitis B Surface AntiGEN     Gabrielle Joiner, MD, FACOG Attending Obstetrician & Gynecologist, [facility], [facility], [place] 11/04/2023

## 2023-11-05 ENCOUNTER — Ambulatory Visit: Payer: Self-pay | Admitting: Obstetrics

## 2023-11-05 LAB — HEPATITIS C ANTIBODY: Hep C Virus Ab: NONREACTIVE

## 2023-11-05 LAB — CERVICOVAGINAL ANCILLARY ONLY
Bacterial Vaginitis (gardnerella): POSITIVE — AB
Candida Glabrata: NEGATIVE
Candida Vaginitis: NEGATIVE
Chlamydia: NEGATIVE
Comment: NEGATIVE
Comment: NEGATIVE
Comment: NEGATIVE
Comment: NEGATIVE
Comment: NEGATIVE
Comment: NORMAL
Neisseria Gonorrhea: NEGATIVE
Trichomonas: NEGATIVE

## 2023-11-05 LAB — HEPATITIS B SURFACE ANTIGEN: Hepatitis B Surface Ag: NEGATIVE

## 2023-11-05 LAB — HIV ANTIBODY (ROUTINE TESTING W REFLEX): HIV Screen 4th Generation wRfx: NONREACTIVE

## 2023-11-05 LAB — RPR: RPR Ser Ql: NONREACTIVE

## 2023-11-10 LAB — CYTOLOGY - PAP: Adequacy: ABNORMAL

## 2023-11-19 ENCOUNTER — Inpatient Hospital Stay (HOSPITAL_COMMUNITY)
Admission: AD | Admit: 2023-11-19 | Discharge: 2023-11-19 | Disposition: A | Discharge status: Home / Self Care | Payer: MEDICAID | Attending: Obstetrics and Gynecology | Admitting: Obstetrics and Gynecology

## 2023-11-19 DIAGNOSIS — R1030 Lower abdominal pain, unspecified: Secondary | ICD-10-CM | POA: Insufficient documentation

## 2023-11-19 DIAGNOSIS — Z3202 Encounter for pregnancy test, result negative: Secondary | ICD-10-CM | POA: Diagnosis present

## 2023-11-19 LAB — HCG, QUANTITATIVE, PREGNANCY: hCG, Beta Chain, Quant, S: <1 m[IU]/mL (ref <5)

## 2023-11-19 NOTE — MAU Note (Addendum)
 Judy [name] is a 28 y.o. at Unknown here in MAU reporting: had a positive UPT at home. Negative test in Urgent care today. C/O pelvic pain x 3. Reprots a clear vag inal discharge. No bleeding  LMP: 11/07/2023 Onset of complaint: 3 days Pain score: 3-7 Vitals:   11/19/23 1044  BP: (!) 115/41  Pulse: 74  Resp: 18  Temp: 98.1 F (36.7 C)     FHT: n/a  Lab orders placed from triage: HCG

## 2023-11-19 NOTE — MAU Provider Note (Signed)
  S:   28 y.o. [id number] @Unknown  by LMP presents to MAU for pregnancy confirmation.  She denies vaginal bleeding today.  She does report some lower abdominal pain. No fever, Np N/v, reports positive home pregnancy test   O: BP (!) 115/41   Pulse 74   Temp 98.1 F (36.7 C)   Resp 18   Ht 5' 2.5" (1.588 m)   Wt 56.2 kg   LMP 11/07/2023 (Exact Date)   BMI 22.32 kg/m  Physical Examination: General appearance - alert, well appearing, and in no distress, oriented to person, place, and time and acyanotic, in no respiratory distress  Results for orders placed or performed during the hospital encounter of 11/19/23 (from the past 48 hours)  hCG, quantitative, pregnancy     Status: None   Collection Time: 11/19/23 10:12 AM  Result Value Ref Range   hCG, Beta Chain, Quant, S <1 <5 mIU/mL    Comment:          GEST. AGE      CONC.  (mIU/mL)   <=1 WEEK        5 - 50     2 WEEKS       50 - 500     3 WEEKS       100 - 10,000     4 WEEKS     1,000 - 30,000     5 WEEKS     3,500 - 115,000   6-8 WEEKS     12,000 - 270,000    12 WEEKS     15,000 - 220,000        FEMALE AND NON-PREGNANT FEMALE:     LESS THAN 5 mIU/mL Performed at [facility], [address] [place], [place] [address]     A:  Abdominal pain  Negative pregnancy test   P: D/C home Return to MAU as needed for pregnancy related emergencies  Almond Jaffe, NP 11:49 AM

## 2024-01-19 ENCOUNTER — Other Ambulatory Visit (HOSPITAL_COMMUNITY)
Admission: RE | Admit: 2024-01-19 | Discharge: 2024-01-19 | Disposition: A | Discharge status: Home / Self Care | Payer: MEDICAID | Source: Ambulatory Visit | Attending: Physician Assistant | Admitting: Physician Assistant

## 2024-01-19 ENCOUNTER — Ambulatory Visit: Payer: MEDICAID | Admitting: Physician Assistant

## 2024-01-19 VITALS — BP 105/45 | HR 63 | Ht 62.5 in | Wt 125.6 lb

## 2024-01-19 DIAGNOSIS — Z3202 Encounter for pregnancy test, result negative: Secondary | ICD-10-CM | POA: Diagnosis not present

## 2024-01-19 DIAGNOSIS — N939 Abnormal uterine and vaginal bleeding, unspecified: Secondary | ICD-10-CM

## 2024-01-19 DIAGNOSIS — Z01419 Encounter for gynecological examination (general) (routine) without abnormal findings: Secondary | ICD-10-CM

## 2024-01-19 LAB — POCT URINE PREGNANCY: Preg Test, Ur: NEGATIVE

## 2024-01-19 NOTE — Progress Notes (Signed)
 Pt presents for repeat PAP. Requesting STD testing.  Pt has concerns about spotting last week, period due in 2 days. Negative UPT today

## 2024-01-19 NOTE — Progress Notes (Signed)
 GYNECOLOGY  VISIT   HPI: Judy [name] is a 28 y.o.  single female [phone] here for spotting that began last week with duration of 3-4 days and has now resolved. She is not due for her period for another 2 days. Does have some cramping, but unsure if its because her period is coming.  She has not used any hormonal contraception for past 7 years. Takes no other medication.  Feels competent that bleeding was coming from vagina.  She has had associated fatigue and appetite changes. Patient denies fever, headaches, dysmenorrhea, dyspareunia, dyschezia, dysuria, urinary frequency, hematuria, incontinence, constipation, ssxs anemia.   Family history includes mother with fibroids and breast cancer.  GYNECOLOGIC HISTORY: No LMP recorded. Contraception: None Menopausal hormone therapy: Premenopausal Last mammogram: Never previously done due to age Last pap smear:  Diagnosis  Date Value Ref Range Status  11/04/2023 - Non-diagnostic (A)  Final           OB History     Gravida  8   Para  7   Term  3   Preterm  4   AB  1   Living  7      SAB  1   IAB  0   Ectopic  0   Multiple  0   Live Births  7              Patient Active Problem List   Diagnosis Date Noted   History of laparoscopic cholecystectomy 06/02/2022   Indication for care in labor and delivery, antepartum 04/24/2022   Hyperthyroidism affecting pregnancy 10/27/2021   Supervision of high risk pregnancy, antepartum 10/24/2021   History of preterm delivery, currently pregnant 06/27/2019   Vaginal delivery 01/05/2019   Abnormal Pap smear of cervix 05/30/2018   Depressive disorder, not elsewhere classified 10/03/2012    Past Medical History:  Diagnosis Date   Anxiety    Asthma    History of preterm delivery    Hyperthyroidism    Post partum depression    Vaginal Pap smear, abnormal    Yeast infection of the vagina     Past Surgical History:  Procedure Laterality Date   CHOLECYSTECTOMY N/A  06/01/2022   Procedure: LAPAROSCOPIC CHOLECYSTECTOMY;  Surgeon: Ebbie Cough, MD;  Location: [facility];  Service: General;  Laterality: N/A;   NO PAST SURGERIES      Current Outpatient Medications  Medication Sig Dispense Refill   acetaminophen  (TYLENOL ) 325 MG tablet Take 2 tablets (650 mg total) by mouth every 4 (four) hours as needed (for pain scale < 4). (Patient not taking: Reported on 07/02/2022) 30 tablet 0   ibuprofen  (ADVIL ) 600 MG tablet Take 1 tablet (600 mg total) by mouth 3 (three) times daily. (Patient not taking: Reported on 11/04/2023) 30 tablet 0   methimazole  (TAPAZOLE ) 5 MG tablet Take 1 tablet (5 mg total) by mouth 2 (two) times daily. (Patient not taking: Reported on 07/02/2022) 60 tablet 2   metroNIDAZOLE  (FLAGYL ) 500 MG tablet Take 1 tablet (500 mg total) by mouth 2 (two) times daily. (Patient not taking: Reported on 11/04/2023) 14 tablet 0   traMADol  (ULTRAM ) 50 MG tablet Take 1 tablet (50 mg total) by mouth every 6 (six) hours as needed for moderate pain. (Patient not taking: Reported on 11/04/2023) 15 tablet 0   No current facility-administered medications for this visit.     ALLERGIES: Penicillins, Latex, and Tape  Family History  Problem Relation Age of Onset   Hypertension Mother  Diabetes Mother    Anxiety disorder Mother    Asthma Mother    Breast cancer Mother    Stomach cancer Mother    Brain cancer Mother    Asthma Father    Hypertension Maternal Grandfather    Heart disease Paternal Grandmother    Heart disease Paternal Grandfather    Hypertension Paternal Aunt     Social History   Socioeconomic History   Marital status: Single    Spouse name: Not on file   Number of children: 3   Years of education: 12   Highest education level: Some college, no degree  Occupational History   Occupation: Unemployed  Tobacco Use   Smoking status: Never   Smokeless tobacco: Never  Vaping Use   Vaping status: Never Used  Substance and Sexual Activity    Alcohol use: Not Currently   Drug use: Not Currently   Sexual activity: Yes    Partners: Male    Birth control/protection: None  Other Topics Concern   Not on file  Social History Narrative   Not on file   Social Drivers of Health   Financial Resource Strain: Low Risk  (07/07/2018)   Overall Financial Resource Strain (CARDIA)    Difficulty of Paying Living Expenses: Not hard at all  Food Insecurity: Patient Declined (05/31/2022)   Hunger Vital Sign    Worried About Running Out of Food in the Last Year: Patient declined    Ran Out of Food in the Last Year: Patient declined  Transportation Needs: Patient Declined (05/31/2022)   PRAPARE - Administrator, Civil Service (Medical): Patient declined    Lack of Transportation (Non-Medical): Patient declined  Physical Activity: Insufficiently Active (07/07/2018)   Exercise Vital Sign    Days of Exercise per Week: 7 days    Minutes of Exercise per Session: 10 min  Stress: No Stress Concern Present (07/07/2018)   [employer] of Occupational Health - Occupational Stress Questionnaire    Feeling of Stress : Only a little  Social Connections: Moderately Integrated (07/07/2018)   Social Connection and Isolation Panel    Frequency of Communication with Friends and Family: More than three times a week    Frequency of Social Gatherings with Friends and Family: More than three times a week    Attends Religious Services: More than 4 times per year    Active Member of [employer] or Organizations: No    Attends Banker Meetings: Never    Marital Status: Living with partner  Intimate Partner Violence: Patient Declined (05/31/2022)   Humiliation, Afraid, Rape, and Kick questionnaire    Fear of Current or Ex-Partner: Patient declined    Emotionally Abused: Patient declined    Physically Abused: Patient declined    Sexually Abused: Patient declined    Review of Systems  PHYSICAL EXAMINATION:    There were no vitals  taken for this visit.    General appearance: alert, cooperative and appears stated age Head: Normocephalic, without obvious abnormality, atraumatic Neck: no adenopathy, supple, symmetrical, trachea midline and thyroid  normal to inspection and palpation Lungs: clear to auscultation bilaterally Heart: regular rate and rhythm Abdomen: + Mild tenderness to palpation of lower abdomen. Soft, no masses,  no organomegaly Extremities: extremities normal, atraumatic, no cyanosis or edema Skin: Skin color, texture, turgor normal. No rashes or lesions Lymph nodes: Cervical, supraclavicular, and axillary nodes normal. No abnormal inguinal nodes palpated Neurologic: Grossly normal  Pelvic: External genitalia:  no lesions  Urethra:  normal appearing urethra with no masses, tenderness or lesions              Bartholins and Skenes: normal                 Vagina: normal appearing vagina with normal color and discharge, no lesions              Cervix: +Two nabothian cysts 12 and 1 o'clock                Bimanual Exam:  Uterus:  normal size, contour, position, consistency, mobility, non-tender              Adnexa: no mass, fullness, tenderness  Chaperone was present for exam  ASSESSMENT & PLAN  1. Abnormal uterine bleeding (Primary) 28 year old female presenting for spotting that began last week, now resolved.  She does not take any medications, has not been on hormonal contraception for 7 years.  She does have a history of abnormal thyroid  test, previously on methimazole , will retest today.  ASCUS with HR HPV positive result in 2021, will repeat Pap smear.  STI testing today.  Negative UPT. Pelvic ultrasound to rule out structural abnormalities.  Further planning pending test results.  Patient states understanding and agreement with plan. - Cervicovaginal ancillary only( [place]) - HIV antibody (with reflex) - RPR - Hepatitis C Antibody - Hepatitis B Surface AntiGEN - POCT urine  pregnancy - TSH - CBC - US  PELVIC COMPLETE WITH TRANSVAGINAL; Future   2. Encounter for gynecological examination with Papanicolaou smear of cervix Last pap non-diagnostic. Repeat today.  - Cytology - PAP( )  An After Visit Summary was printed and given to the patient.  Saburo Luger E Analeia Ismael, PA-C 7/30/20257:49 AM

## 2024-01-20 LAB — CERVICOVAGINAL ANCILLARY ONLY
Bacterial Vaginitis (gardnerella): POSITIVE — AB
Candida Glabrata: NEGATIVE
Candida Vaginitis: NEGATIVE
Chlamydia: NEGATIVE
Comment: NEGATIVE
Comment: NEGATIVE
Comment: NEGATIVE
Comment: NEGATIVE
Comment: NEGATIVE
Comment: NORMAL
Neisseria Gonorrhea: NEGATIVE
Trichomonas: NEGATIVE

## 2024-01-20 LAB — HEPATITIS C ANTIBODY: Hep C Virus Ab: NONREACTIVE

## 2024-01-20 LAB — HEPATITIS B SURFACE ANTIGEN: Hepatitis B Surface Ag: NEGATIVE

## 2024-01-20 LAB — HIV ANTIBODY (ROUTINE TESTING W REFLEX): HIV Screen 4th Generation wRfx: NONREACTIVE

## 2024-01-20 LAB — CBC
Hematocrit: 35.6 % (ref 34.0–46.6)
Hemoglobin: 11.2 g/dL (ref 11.1–15.9)
MCH: 26.5 pg — ABNORMAL LOW (ref 26.6–33.0)
MCHC: 31.5 g/dL (ref 31.5–35.7)
MCV: 84 fL (ref 79–97)
Platelets: 278 x10E3/uL (ref 150–450)
RBC: 4.23 x10E6/uL (ref 3.77–5.28)
RDW: 12.6 % (ref 11.7–15.4)
WBC: 5.2 x10E3/uL (ref 3.4–10.8)

## 2024-01-20 LAB — TSH: TSH: 0.367 u[IU]/mL — ABNORMAL LOW (ref 0.450–4.500)

## 2024-01-20 LAB — RPR: RPR Ser Ql: NONREACTIVE

## 2024-01-24 ENCOUNTER — Ambulatory Visit: Payer: Self-pay | Admitting: Physician Assistant

## 2024-01-24 ENCOUNTER — Other Ambulatory Visit: Payer: Self-pay | Admitting: Physician Assistant

## 2024-01-24 ENCOUNTER — Ambulatory Visit (HOSPITAL_BASED_OUTPATIENT_CLINIC_OR_DEPARTMENT_OTHER)
Admission: RE | Admit: 2024-01-24 | Discharge: 2024-01-24 | Disposition: A | Discharge status: Home / Self Care | Payer: MEDICAID | Source: Ambulatory Visit | Attending: Physician Assistant | Admitting: Physician Assistant

## 2024-01-24 DIAGNOSIS — R7989 Other specified abnormal findings of blood chemistry: Secondary | ICD-10-CM

## 2024-01-24 DIAGNOSIS — Z01419 Encounter for gynecological examination (general) (routine) without abnormal findings: Secondary | ICD-10-CM | POA: Insufficient documentation

## 2024-01-25 ENCOUNTER — Other Ambulatory Visit: Payer: Self-pay

## 2024-01-25 LAB — CYTOLOGY - PAP: Diagnosis: NEGATIVE

## 2024-01-25 MED ORDER — METRONIDAZOLE 500 MG PO TABS: 500.0000 mg | ORAL_TABLET | Freq: Two times a day (BID) | ORAL | 0 refills | Status: AC

## 2024-08-17 ENCOUNTER — Ambulatory Visit: Payer: MEDICAID | Admitting: "Endocrinology
